# Patient Record
Sex: Male | Born: 1945 | Race: White | Hispanic: No | Marital: Married | Smoking: Former smoker
Health system: Southern US, Community
[De-identification: ages and names within clinical notes are randomized; demographics above are authoritative.]

## PROBLEM LIST (undated history)

## (undated) DIAGNOSIS — K589 Irritable bowel syndrome without diarrhea: Secondary | ICD-10-CM

## (undated) DIAGNOSIS — K5792 Diverticulitis of intestine, part unspecified, without perforation or abscess without bleeding: Secondary | ICD-10-CM

## (undated) DIAGNOSIS — M1990 Unspecified osteoarthritis, unspecified site: Secondary | ICD-10-CM

## (undated) DIAGNOSIS — N189 Chronic kidney disease, unspecified: Secondary | ICD-10-CM

## (undated) DIAGNOSIS — IMO0001 Reserved for inherently not codable concepts without codable children: Secondary | ICD-10-CM

## (undated) DIAGNOSIS — K50014 Crohn's disease of small intestine with abscess: Secondary | ICD-10-CM

## (undated) DIAGNOSIS — D649 Anemia, unspecified: Secondary | ICD-10-CM

## (undated) DIAGNOSIS — K279 Peptic ulcer, site unspecified, unspecified as acute or chronic, without hemorrhage or perforation: Secondary | ICD-10-CM

## (undated) DIAGNOSIS — Z974 Presence of external hearing-aid: Secondary | ICD-10-CM

## (undated) DIAGNOSIS — E119 Type 2 diabetes mellitus without complications: Secondary | ICD-10-CM

## (undated) DIAGNOSIS — T7840XA Allergy, unspecified, initial encounter: Secondary | ICD-10-CM

## (undated) DIAGNOSIS — K219 Gastro-esophageal reflux disease without esophagitis: Secondary | ICD-10-CM

## (undated) DIAGNOSIS — C4491 Basal cell carcinoma of skin, unspecified: Secondary | ICD-10-CM

## (undated) DIAGNOSIS — H269 Unspecified cataract: Secondary | ICD-10-CM

## (undated) DIAGNOSIS — H9192 Unspecified hearing loss, left ear: Secondary | ICD-10-CM

## (undated) DIAGNOSIS — A389 Scarlet fever, uncomplicated: Secondary | ICD-10-CM

## (undated) DIAGNOSIS — Z972 Presence of dental prosthetic device (complete) (partial): Secondary | ICD-10-CM

## (undated) HISTORY — DX: Peptic ulcer, site unspecified, unspecified as acute or chronic, without hemorrhage or perforation: K27.9

## (undated) HISTORY — PX: OTHER SURGICAL HISTORY: SHX169

## (undated) HISTORY — DX: Irritable bowel syndrome, unspecified: K58.9

## (undated) HISTORY — DX: Crohn's disease of small intestine with abscess: K50.014

## (undated) HISTORY — PX: CATARACT EXTRACTION W/ INTRAOCULAR LENS  IMPLANT, BILATERAL: SHX1307

## (undated) HISTORY — PX: HERNIA REPAIR: SHX51

## (undated) HISTORY — DX: Anemia, unspecified: D64.9

## (undated) HISTORY — PX: EYE SURGERY: SHX253

## (undated) HISTORY — DX: Basal cell carcinoma of skin, unspecified: C44.91

## (undated) HISTORY — DX: Scarlet fever, uncomplicated: A38.9

## (undated) HISTORY — DX: Allergy, unspecified, initial encounter: T78.40XA

---

## 1990-02-24 DIAGNOSIS — IMO0001 Reserved for inherently not codable concepts without codable children: Secondary | ICD-10-CM

## 1990-02-24 HISTORY — DX: Reserved for inherently not codable concepts without codable children: IMO0001

## 2009-04-30 ENCOUNTER — Emergency Department: Payer: Self-pay | Admitting: Emergency Medicine

## 2013-01-04 ENCOUNTER — Ambulatory Visit: Payer: Self-pay | Admitting: Family Medicine

## 2014-01-18 DIAGNOSIS — J302 Other seasonal allergic rhinitis: Secondary | ICD-10-CM | POA: Insufficient documentation

## 2014-01-18 DIAGNOSIS — R7303 Prediabetes: Secondary | ICD-10-CM | POA: Insufficient documentation

## 2014-01-18 DIAGNOSIS — M129 Arthropathy, unspecified: Secondary | ICD-10-CM | POA: Insufficient documentation

## 2015-01-23 DIAGNOSIS — E119 Type 2 diabetes mellitus without complications: Secondary | ICD-10-CM | POA: Insufficient documentation

## 2015-03-01 ENCOUNTER — Ambulatory Visit (INDEPENDENT_AMBULATORY_CARE_PROVIDER_SITE_OTHER): Payer: Medicare HMO | Admitting: Surgery

## 2015-03-01 ENCOUNTER — Encounter: Payer: Self-pay | Admitting: Surgery

## 2015-03-01 VITALS — BP 155/90 | HR 88 | Temp 98.2°F | Ht 70.0 in | Wt 170.2 lb

## 2015-03-01 DIAGNOSIS — R1011 Right upper quadrant pain: Secondary | ICD-10-CM | POA: Diagnosis not present

## 2015-03-01 DIAGNOSIS — K8020 Calculus of gallbladder without cholecystitis without obstruction: Secondary | ICD-10-CM | POA: Diagnosis not present

## 2015-03-01 DIAGNOSIS — C4491 Basal cell carcinoma of skin, unspecified: Secondary | ICD-10-CM | POA: Insufficient documentation

## 2015-03-01 DIAGNOSIS — K589 Irritable bowel syndrome without diarrhea: Secondary | ICD-10-CM

## 2015-03-01 DIAGNOSIS — K279 Peptic ulcer, site unspecified, unspecified as acute or chronic, without hemorrhage or perforation: Secondary | ICD-10-CM | POA: Insufficient documentation

## 2015-03-01 DIAGNOSIS — D649 Anemia, unspecified: Secondary | ICD-10-CM

## 2015-03-01 NOTE — Patient Instructions (Addendum)
You are requesting to have your gallbladder removed. We will arrange this to be done on 03/22/15.  Please refer to your (blue) pre-care surgery information sheet.

## 2015-03-01 NOTE — Addendum Note (Signed)
Addended by: Hubbard Robinson on: 03/01/2015 05:29 PM   Modules accepted: Orders

## 2015-03-01 NOTE — Progress Notes (Signed)
Subjective:     Patient ID: Todd Briggs, male   DOB: 12/24/1945, 69 y.o.   MRN: [id number]  HPI  69 yr old male with complaint of RUQ pain.  He had this a few years back and saw Dr. Lundquist but did not want his gallbladder removed then.  Patient states that a few weeks ago after eating a sub with a lot of oil and grease he began having 7 of 10 pain in his RUQ that move around to his back.  Patient states the pain was constant and stayed for about 6 hours before stopping.  Patient states some nausea along with the pain but denies any fever, chills, sob, chest pain, vomiting, diarrhea, constipation or dysuria.  Patient states that his daughter had to have her gallbladder removed too.  Patient does also endorse occasional pains in LLQ along previous hernia site.    Past Medical History  Diagnosis Date  . IBS (irritable bowel syndrome)   . Peptic ulcer 70 years old  . Anemia   . Basal cell carcinoma     Removed 1980's  . Allergy     Seasonal  . Scarlet fever 70 years old   Past Surgical History  Procedure Laterality Date  . Hernia repair Bilateral 70 years old    Inguinal Hernia   Family History  Problem Relation Age of Onset  . Hypertension Mother   . Diabetes Mother   . Pancreatitis Mother   . Heart disease Mother   . Heart attack Father   . COPD Father   . Heart disease Father    Social History   Social History  . Marital Status: Married    Spouse Name: N/A  . Number of Children: N/A  . Years of Education: N/A   Social History Main Topics  . Smoking status: Former Smoker    Quit date: 02/28/1985  . Smokeless tobacco: Never Used  . Alcohol Use: No  . Drug Use: No  . Sexual Activity: Not Asked   Other Topics Concern  . None   Social History Narrative    Current outpatient prescriptions:  .  cetirizine (ZYRTEC) 10 MG tablet, Take 10 mg by mouth daily., Disp: , Rfl:  .  ibuprofen (ADVIL,MOTRIN) 200 MG tablet, Take 200 mg by mouth every 8 (eight) hours as  needed., Disp: , Rfl:  Allergies  Allergen Reactions  . Penicillins     Occurred as a child    Filed Vitals:   03/01/15 1505  BP: 155/90  Pulse: 88  Temp: 98.2 F (36.8 C)      Review of Systems  Constitutional: Negative for fever, chills, activity change, appetite change, fatigue and unexpected weight change.  HENT: Negative for congestion and sore throat.   Respiratory: Negative for chest tightness, shortness of breath and wheezing.   Cardiovascular: Negative for chest pain, palpitations and leg swelling.  Gastrointestinal: Positive for nausea and abdominal pain. Negative for vomiting, diarrhea, constipation, blood in stool and rectal pain.  Genitourinary: Negative for dysuria and hematuria.  Musculoskeletal: Negative for myalgias, back pain and gait problem.  Skin: Negative for color change, pallor, rash and wound.  Neurological: Negative for dizziness and weakness.  Hematological: Negative for adenopathy. Does not bruise/bleed easily.  Psychiatric/Behavioral: Negative for agitation. The patient is not nervous/anxious.   All other systems reviewed and are negative.      Objective:   Physical Exam  Constitutional: He is oriented to person, place, and time. He appears well-developed and   well-nourished. No distress.  HENT:  Head: Normocephalic and atraumatic.  Right Ear: External ear normal.  Left Ear: External ear normal.  Nose: Nose normal.  Mouth/Throat: Oropharynx is clear and moist. No oropharyngeal exudate.  Eyes: Conjunctivae and EOM are normal. Pupils are equal, round, and reactive to light. No scleral icterus.  Neck: Normal range of motion. Neck supple. No tracheal deviation present.  Cardiovascular: Normal rate, regular rhythm, normal heart sounds and intact distal pulses.  Exam reveals no gallop and no friction rub.   No murmur heard. Pulmonary/Chest: Effort normal and breath sounds normal. No respiratory distress. He has no wheezes. He has no rales.   Abdominal: Soft. Bowel sounds are normal. He exhibits no distension. There is no tenderness. There is no rebound and no guarding.  Musculoskeletal: Normal range of motion. He exhibits no edema or tenderness.  Neurological: He is alert and oriented to person, place, and time.  Skin: Skin is warm and dry. No rash noted. No erythema. No pallor.  Psychiatric: He has a normal mood and affect. His behavior is normal. Judgment and thought content normal.  Vitals reviewed.  U/S:  Cholelithiasis with 1.4cm stone in neck, CBD 6mm     Assessment:     69 yr old male with symptomatic cholelithasis     Plan:     The risks, benefits, complications, treatment options, and expected outcomes were discussed with the patient. The possibilities of bleeding, recurrent infection, finding a normal gallbladder, perforation of viscus organs, damage to surrounding structures, bile leak, abscess formation, needing a drain placed, the need for additional procedures, reaction to medication, pulmonary aspiration,  failure to diagnose a condition, the possible need to convert to an open procedure, and creating a complication requiring transfusion or operation were discussed with the patient. The patient and/or family concurred with the proposed plan, giving informed consent.  Planned for 03/22/15.         

## 2015-03-02 ENCOUNTER — Telehealth: Payer: Self-pay | Admitting: Surgery

## 2015-03-02 NOTE — Telephone Encounter (Signed)
Pt advised of pre op date/time and sx date. Sx: 03/22/15 with Dr Rossie Muskrat chole Pre op: 03/13/15 @ 9:45am--office.

## 2015-03-07 ENCOUNTER — Telehealth: Payer: Self-pay | Admitting: Surgery

## 2015-03-07 NOTE — Telephone Encounter (Signed)
No authorization is required for CPT: [phone] with [employer] per automated voice response. Reference number---[id number].

## 2015-03-13 ENCOUNTER — Encounter
Admission: RE | Admit: 2015-03-13 | Discharge: 2015-03-13 | Disposition: A | Discharge status: Home / Self Care | Payer: Medicare HMO | Source: Ambulatory Visit | Attending: Surgery | Admitting: Surgery

## 2015-03-13 DIAGNOSIS — Z01812 Encounter for preprocedural laboratory examination: Secondary | ICD-10-CM | POA: Diagnosis present

## 2015-03-13 DIAGNOSIS — Z01810 Encounter for preprocedural cardiovascular examination: Secondary | ICD-10-CM | POA: Diagnosis present

## 2015-03-13 HISTORY — DX: Reserved for inherently not codable concepts without codable children: IMO0001

## 2015-03-13 HISTORY — DX: Unspecified cataract: H26.9

## 2015-03-13 HISTORY — DX: Unspecified osteoarthritis, unspecified site: M19.90

## 2015-03-13 HISTORY — DX: Type 2 diabetes mellitus without complications: E11.9

## 2015-03-13 HISTORY — DX: Gastro-esophageal reflux disease without esophagitis: K21.9

## 2015-03-13 LAB — BASIC METABOLIC PANEL
Anion gap: 4 — ABNORMAL LOW (ref 5–15)
BUN: 21 mg/dL — ABNORMAL HIGH (ref 6–20)
CHLORIDE: 107 mmol/L (ref 101–111)
CO2: 30 mmol/L (ref 22–32)
CREATININE: 1 mg/dL (ref 0.61–1.24)
Calcium: 8.1 mg/dL — ABNORMAL LOW (ref 8.9–10.3)
GFR calc non Af Amer: >60 mL/min (ref >60)
Glucose, Bld: 95 mg/dL (ref 65–99)
Potassium: 4.1 mmol/L (ref 3.5–5.1)
Sodium: 141 mmol/L (ref 135–145)

## 2015-03-13 NOTE — Patient Instructions (Signed)
  Your procedure is scheduled on: March 22, 2015 (Thursday) Report to Day Surgery.[facility]) Second Floor To find out your arrival time please call [phone] between 1PM - 3PM on March 21, 2015 )Wednesday).  Remember: Instructions that are not followed completely may result in serious medical risk, up to and including death, or upon the discretion of your surgeon and anesthesiologist your surgery may need to be rescheduled.    __x__ 1. Do not eat food or drink liquids after midnight. No gum chewing or hard candies.     __x__ 2. No Alcohol for 24 hours before or after surgery.   ____ 3. Bring all medications with you on the day of surgery if instructed.    __x__ 4. Notify your doctor if there is any change in your medical condition     (cold, fever, infections).     Do not wear jewelry, make-up, hairpins, clips or nail polish.  Do not wear lotions, powders, or perfumes. You may wear deodorant.  Do not shave 48 hours prior to surgery. Men may shave face and neck.  Do not bring valuables to the hospital.    [facility] is not responsible for any belongings or valuables.               Contacts, dentures or bridgework may not be worn into surgery.  Leave your suitcase in the car. After surgery it may be brought to your room.  For patients admitted to the hospital, discharge time is determined by your                treatment team.   Patients discharged the day of surgery will not be allowed to drive home.   Please read over the following fact sheets that you were given:   Surgical Site Infection Prevention   ____ Take these medicines the morning of surgery with A SIP OF WATER:    1.   2.   3.   4.  5.  6.  ____ Fleet Enema (as directed)   __x__ Use CHG Soap as directed  ____ Use inhalers on the day of surgery  ____ Stop metformin 2 days prior to surgery    ____ Take 1/2 of usual insulin dose the night before surgery and none on the morning of surgery.   ____  Stop Coumadin/Plavix/aspirin on   _x___ Stop Anti-inflammatories on (Stop Advil NOW)  Tylenol ok to take for pain if needed   ____ Stop supplements until after surgery.    ____ Bring C-Pap to the hospital.

## 2015-03-13 NOTE — Pre-Procedure Instructions (Signed)
EKG sent to Dr. Azalee Course and anesthesia for review.

## 2015-03-19 ENCOUNTER — Telehealth: Payer: Self-pay | Admitting: Surgery

## 2015-03-19 NOTE — Telephone Encounter (Signed)
This surgery has been canceled for 03/22/15 with Dr Azalee Course. Please let me know when patient would like to reschedule.

## 2015-03-19 NOTE — Telephone Encounter (Signed)
Returned phone call to patient at this time. No answer. Unable to leave a voicemail but did leave my callback number for patient to return phone call.

## 2015-03-19 NOTE — Telephone Encounter (Signed)
Patient has an upcoming surgery and feels like he is getting a cold. He would like to talk to a nurse about this.

## 2015-03-19 NOTE — Telephone Encounter (Signed)
Patient called back in at this time. Patient is coughing up copious amounts of yellow phlegm, has been running a low grade fever around 100, and is very fatigued for the last 3 days. Advised him that it is not advised that he have surgery while having theses symptoms. Patient is very understanding about this.   Please cancel surgery for 1/26 with Dr. Azalee Course. I will contact the surgeon and decide on a reschedule date. We will update patient on this date once this has been confirmed by OR staff.

## 2015-03-21 ENCOUNTER — Telehealth: Payer: Self-pay | Admitting: Surgery

## 2015-03-21 NOTE — Telephone Encounter (Signed)
Pt advised of pre op date/time and sx date. Sx: 03/30/15 with Dr Lenore Manner Cholecystectomy Pre op: 03/13/15 @ 9:45am--office.  This surgery was rescheduled from 03/22/15.

## 2015-03-21 NOTE — Telephone Encounter (Signed)
Patient has been rescheduled for 03/30/15 with Dr Azalee Course.

## 2015-03-22 ENCOUNTER — Encounter: Admission: RE | Payer: Self-pay | Source: Ambulatory Visit

## 2015-03-22 ENCOUNTER — Ambulatory Visit: Admission: RE | Admit: 2015-03-22 | Payer: Medicare HMO | Source: Ambulatory Visit | Admitting: Surgery

## 2015-03-22 SURGERY — LAPAROSCOPIC CHOLECYSTECTOMY WITH INTRAOPERATIVE CHOLANGIOGRAM
Anesthesia: General

## 2015-03-29 MED ORDER — SODIUM CHLORIDE 0.9 % IV SOLN: INTRAVENOUS | Status: DC

## 2015-03-30 ENCOUNTER — Encounter
Admission: RE | Disposition: A | Discharge status: Home / Self Care | Payer: Self-pay | Source: Ambulatory Visit | Attending: Surgery

## 2015-03-30 ENCOUNTER — Ambulatory Visit
Admission: RE | Admit: 2015-03-30 | Discharge: 2015-03-30 | Disposition: A | Discharge status: Home / Self Care | Payer: Medicare HMO | Source: Ambulatory Visit | Attending: Surgery | Admitting: Surgery

## 2015-03-30 ENCOUNTER — Encounter: Payer: Self-pay | Admitting: *Deleted

## 2015-03-30 ENCOUNTER — Ambulatory Visit: Payer: Medicare HMO | Admitting: Anesthesiology

## 2015-03-30 DIAGNOSIS — K8010 Calculus of gallbladder with chronic cholecystitis without obstruction: Secondary | ICD-10-CM | POA: Diagnosis not present

## 2015-03-30 DIAGNOSIS — Z87898 Personal history of other specified conditions: Secondary | ICD-10-CM | POA: Insufficient documentation

## 2015-03-30 DIAGNOSIS — Z8379 Family history of other diseases of the digestive system: Secondary | ICD-10-CM | POA: Diagnosis not present

## 2015-03-30 DIAGNOSIS — Z8711 Personal history of peptic ulcer disease: Secondary | ICD-10-CM | POA: Diagnosis not present

## 2015-03-30 DIAGNOSIS — Z825 Family history of asthma and other chronic lower respiratory diseases: Secondary | ICD-10-CM | POA: Diagnosis not present

## 2015-03-30 DIAGNOSIS — K811 Chronic cholecystitis: Secondary | ICD-10-CM | POA: Diagnosis present

## 2015-03-30 DIAGNOSIS — Z8249 Family history of ischemic heart disease and other diseases of the circulatory system: Secondary | ICD-10-CM | POA: Insufficient documentation

## 2015-03-30 DIAGNOSIS — K589 Irritable bowel syndrome without diarrhea: Secondary | ICD-10-CM | POA: Diagnosis not present

## 2015-03-30 DIAGNOSIS — Z79899 Other long term (current) drug therapy: Secondary | ICD-10-CM | POA: Insufficient documentation

## 2015-03-30 DIAGNOSIS — Z833 Family history of diabetes mellitus: Secondary | ICD-10-CM | POA: Diagnosis not present

## 2015-03-30 DIAGNOSIS — Z85828 Personal history of other malignant neoplasm of skin: Secondary | ICD-10-CM | POA: Diagnosis not present

## 2015-03-30 DIAGNOSIS — K8000 Calculus of gallbladder with acute cholecystitis without obstruction: Secondary | ICD-10-CM | POA: Insufficient documentation

## 2015-03-30 DIAGNOSIS — Z880 Allergy status to penicillin: Secondary | ICD-10-CM | POA: Insufficient documentation

## 2015-03-30 DIAGNOSIS — D649 Anemia, unspecified: Secondary | ICD-10-CM | POA: Diagnosis not present

## 2015-03-30 DIAGNOSIS — Z87891 Personal history of nicotine dependence: Secondary | ICD-10-CM | POA: Insufficient documentation

## 2015-03-30 HISTORY — PX: CHOLECYSTECTOMY: SHX55

## 2015-03-30 LAB — GLUCOSE, CAPILLARY
Glucose-Capillary: 86 mg/dL (ref 65–99)
Glucose-Capillary: 98 mg/dL (ref 65–99)

## 2015-03-30 SURGERY — LAPAROSCOPIC CHOLECYSTECTOMY
Anesthesia: General | Wound class: Clean Contaminated

## 2015-03-30 MED ORDER — SUGAMMADEX SODIUM 200 MG/2ML IV SOLN
INTRAVENOUS | Status: DC | PRN
  Administered 2015-03-30: 154.2 mg via INTRAVENOUS

## 2015-03-30 MED ORDER — FAMOTIDINE 20 MG PO TABS
20.0000 mg | ORAL_TABLET | Freq: Once | ORAL | Status: AC
  Administered 2015-03-30: 20 mg via ORAL

## 2015-03-30 MED ORDER — CHLORHEXIDINE GLUCONATE 4 % EX LIQD: 1.0000 "application " | Freq: Once | CUTANEOUS | Status: DC

## 2015-03-30 MED ORDER — ONDANSETRON HCL 4 MG/2ML IJ SOLN
INTRAMUSCULAR | Status: DC | PRN
  Administered 2015-03-30: 4 mg via INTRAVENOUS

## 2015-03-30 MED ORDER — HYDROMORPHONE HCL 1 MG/ML IJ SOLN
0.2500 mg | INTRAMUSCULAR | Status: DC | PRN
  Administered 2015-03-30: 0.25 mg via INTRAVENOUS

## 2015-03-30 MED ORDER — PHENYLEPHRINE HCL 10 MG/ML IJ SOLN
INTRAMUSCULAR | Status: DC | PRN
  Administered 2015-03-30: 100 ug via INTRAVENOUS

## 2015-03-30 MED ORDER — HYDROMORPHONE HCL 1 MG/ML IJ SOLN
INTRAMUSCULAR | Status: AC
  Filled 2015-03-30: qty 1

## 2015-03-30 MED ORDER — FAMOTIDINE 20 MG PO TABS
ORAL_TABLET | ORAL | Status: AC
  Administered 2015-03-30: 20 mg via ORAL
  Filled 2015-03-30: qty 1

## 2015-03-30 MED ORDER — CIPROFLOXACIN IN D5W 400 MG/200ML IV SOLN
INTRAVENOUS | Status: AC
  Administered 2015-03-30: 400 mg via INTRAVENOUS
  Filled 2015-03-30: qty 200

## 2015-03-30 MED ORDER — ROCURONIUM BROMIDE 100 MG/10ML IV SOLN
INTRAVENOUS | Status: DC | PRN
  Administered 2015-03-30: 40 mg via INTRAVENOUS
  Administered 2015-03-30: 10 mg via INTRAVENOUS

## 2015-03-30 MED ORDER — KETOROLAC TROMETHAMINE 30 MG/ML IJ SOLN
INTRAMUSCULAR | Status: DC | PRN
  Administered 2015-03-30: 30 mg via INTRAVENOUS

## 2015-03-30 MED ORDER — BUPIVACAINE HCL (PF) 0.5 % IJ SOLN
INTRAMUSCULAR | Status: AC
  Filled 2015-03-30: qty 30

## 2015-03-30 MED ORDER — CIPROFLOXACIN IN D5W 400 MG/200ML IV SOLN
400.0000 mg | INTRAVENOUS | Status: AC
  Administered 2015-03-30: 400 mg via INTRAVENOUS

## 2015-03-30 MED ORDER — SODIUM CHLORIDE 0.9 % IV SOLN
INTRAVENOUS | Status: DC
  Administered 2015-03-30 (×3): via INTRAVENOUS

## 2015-03-30 MED ORDER — BUPIVACAINE HCL (PF) 0.5 % IJ SOLN
INTRAMUSCULAR | Status: DC | PRN
  Administered 2015-03-30: 30 mL

## 2015-03-30 MED ORDER — LIDOCAINE HCL (CARDIAC) 20 MG/ML IV SOLN
INTRAVENOUS | Status: DC | PRN
  Administered 2015-03-30: 100 mg via INTRAVENOUS

## 2015-03-30 MED ORDER — SUCCINYLCHOLINE CHLORIDE 20 MG/ML IJ SOLN
INTRAMUSCULAR | Status: DC | PRN
  Administered 2015-03-30: 120 mg via INTRAVENOUS

## 2015-03-30 MED ORDER — METRONIDAZOLE IN NACL 5-0.79 MG/ML-% IV SOLN
500.0000 mg | INTRAVENOUS | Status: AC
  Administered 2015-03-30: 500 mg via INTRAVENOUS
  Filled 2015-03-30: qty 100

## 2015-03-30 MED ORDER — PROPOFOL 10 MG/ML IV BOLUS
INTRAVENOUS | Status: DC | PRN
  Administered 2015-03-30: 150 mg via INTRAVENOUS

## 2015-03-30 MED ORDER — ONDANSETRON HCL 4 MG/2ML IJ SOLN: 4.0000 mg | Freq: Once | INTRAMUSCULAR | Status: DC | PRN

## 2015-03-30 MED ORDER — FENTANYL CITRATE (PF) 100 MCG/2ML IJ SOLN
INTRAMUSCULAR | Status: DC | PRN
  Administered 2015-03-30: 50 ug via INTRAVENOUS
  Administered 2015-03-30: 100 ug via INTRAVENOUS

## 2015-03-30 MED ORDER — MIDAZOLAM HCL 2 MG/2ML IJ SOLN
INTRAMUSCULAR | Status: DC | PRN
  Administered 2015-03-30: 2 mg via INTRAVENOUS

## 2015-03-30 MED ORDER — LACTATED RINGERS IV SOLN: INTRAVENOUS | Status: DC

## 2015-03-30 MED ORDER — OXYCODONE-ACETAMINOPHEN 5-325 MG PO TABS
1.0000 | ORAL_TABLET | ORAL | Status: DC | PRN
Start: 2015-03-30 — End: 2015-04-18

## 2015-03-30 MED ORDER — DEXAMETHASONE SODIUM PHOSPHATE 10 MG/ML IJ SOLN
INTRAMUSCULAR | Status: DC | PRN
  Administered 2015-03-30: 10 mg via INTRAVENOUS

## 2015-03-30 SURGICAL SUPPLY — 38 items
APPLIER CLIP 5 13 M/L LIGAMAX5 (MISCELLANEOUS) ×3
BLADE SURG 15 STRL LF DISP TIS (BLADE) ×1 IMPLANT
BLADE SURG 15 STRL SS (BLADE) ×2
CANISTER SUCT 1200ML W/VALVE (MISCELLANEOUS) ×3 IMPLANT
CATH CHOLANGI 4FR 420404F (CATHETERS) IMPLANT
CHLORAPREP W/TINT 26ML (MISCELLANEOUS) ×3 IMPLANT
CLIP APPLIE 5 13 M/L LIGAMAX5 (MISCELLANEOUS) ×1 IMPLANT
CONRAY 60ML FOR OR (MISCELLANEOUS) IMPLANT
DEFOGGER SCOPE WARMER CLEARIFY (MISCELLANEOUS) ×3 IMPLANT
ELECT E-Z MONOPOLAR 33 (MISCELLANEOUS) ×3
ELECT REM PT RETURN 9FT ADLT (ELECTROSURGICAL) ×3
ELECTRODE E-Z MONOPOLAR 33 (MISCELLANEOUS) ×1 IMPLANT
ELECTRODE REM PT RTRN 9FT ADLT (ELECTROSURGICAL) ×1 IMPLANT
ENDOPOUCH RETRIEVER 10 (MISCELLANEOUS) ×3 IMPLANT
GLOVE PI ORTHOPRO 6.5 (GLOVE) ×2
GLOVE PI ORTHOPRO STRL 6.5 (GLOVE) ×1 IMPLANT
GOWN STRL REUS W/ TWL LRG LVL3 (GOWN DISPOSABLE) ×4 IMPLANT
GOWN STRL REUS W/TWL LRG LVL3 (GOWN DISPOSABLE) ×8
IRRIGATION STRYKERFLOW (MISCELLANEOUS) ×1 IMPLANT
IRRIGATOR STRYKERFLOW (MISCELLANEOUS) ×3
IV CATH ANGIO 12GX3 LT BLUE (NEEDLE) IMPLANT
IV NS 1000ML (IV SOLUTION) ×2
IV NS 1000ML BAXH (IV SOLUTION) ×1 IMPLANT
LABEL OR SOLS (LABEL) ×3 IMPLANT
LIQUID BAND (GAUZE/BANDAGES/DRESSINGS) ×3 IMPLANT
NEEDLE HYPO 25X1 1.5 SAFETY (NEEDLE) ×3 IMPLANT
NS IRRIG 500ML POUR BTL (IV SOLUTION) ×3 IMPLANT
PACK LAP CHOLECYSTECTOMY (MISCELLANEOUS) ×3 IMPLANT
PENCIL ELECTRO HAND CTR (MISCELLANEOUS) ×3 IMPLANT
SCISSORS METZENBAUM CVD 33 (INSTRUMENTS) ×3 IMPLANT
SLEEVE ENDOPATH XCEL 5M (ENDOMECHANICALS) ×6 IMPLANT
SUT MNCRL 4-0 (SUTURE) ×4
SUT MNCRL 4-0 27XMFL (SUTURE) ×2
SUT VICRYL 0 AB UR-6 (SUTURE) ×3 IMPLANT
SUTURE MNCRL 4-0 27XMF (SUTURE) ×2 IMPLANT
TROCAR XCEL BLUNT TIP 100MML (ENDOMECHANICALS) ×3 IMPLANT
TROCAR XCEL NON-BLD 5MMX100MML (ENDOMECHANICALS) ×3 IMPLANT
TUBING INSUFFLATOR HI FLOW (MISCELLANEOUS) ×3 IMPLANT

## 2015-03-30 NOTE — Anesthesia Postprocedure Evaluation (Signed)
Anesthesia Post Note  Patient: Todd Briggs  Procedure(s) Performed: Procedure(s) (LRB): LAPAROSCOPIC CHOLECYSTECTOMY (N/A)  Patient location during evaluation: PACU Anesthesia Type: General Level of consciousness: awake and alert Pain management: pain level controlled Vital Signs Assessment: post-procedure vital signs reviewed and stable Respiratory status: spontaneous breathing Cardiovascular status: blood pressure returned to baseline Postop Assessment: no headache Anesthetic complications: no    Last Vitals:  Filed Vitals:   03/30/15 0923 03/30/15 0932  BP:    Pulse: 64 71  Temp:    Resp: 15 16    Last Pain: There were no vitals filed for this visit.               [name]

## 2015-03-30 NOTE — Interval H&P Note (Signed)
History and Physical Interval Note:  03/30/2015 7:28 AM  Todd Briggs  has presented today for surgery, with the diagnosis of symptomatic cholelithiasis  The various methods of treatment have been discussed with the patient and family. After consideration of risks, benefits and other options for treatment, the patient has consented to  Procedure(s): LAPAROSCOPIC CHOLECYSTECTOMY (N/A) as a surgical intervention .  The patient's history has been reviewed, patient examined, no change in status, stable for surgery.  I have reviewed the patient's chart and labs.  Questions were answered to the patient's satisfaction.     Catherine L Loflin

## 2015-03-30 NOTE — Anesthesia Preprocedure Evaluation (Signed)
Anesthesia Evaluation  Patient identified by MRN, date of birth, ID band Patient awake    Reviewed: Allergy & Precautions, NPO status , Patient's Chart, lab work & pertinent test results  Airway Mallampati: II  TM Distance: >3 FB Neck ROM: Limited    Dental  (+) Upper Dentures   Pulmonary shortness of breath and with exertion, former smoker,    Pulmonary exam normal        Cardiovascular Exercise Tolerance: Good Normal cardiovascular exam     Neuro/Psych    GI/Hepatic GERD  Medicated and Controlled,  Endo/Other  diabetes, Well Controlled, Type 2BG 86.  Renal/GU      Musculoskeletal  (+) Arthritis , Osteoarthritis,    Abdominal (+)  Abdomen: soft.    Peds  Hematology   Anesthesia Other Findings   Reproductive/Obstetrics                             Anesthesia Physical Anesthesia Plan  ASA: III  Anesthesia Plan: General   Post-op Pain Management:    Induction: Intravenous  Airway Management Planned: Oral ETT  Additional Equipment:   Intra-op Plan:   Post-operative Plan: Extubation in OR  Informed Consent: I have reviewed the patients History and Physical, chart, labs and discussed the procedure including the risks, benefits and alternatives for the proposed anesthesia with the patient or authorized representative who has indicated his/her understanding and acceptance.     Plan Discussed with: CRNA  Anesthesia Plan Comments:         Anesthesia Quick Evaluation

## 2015-03-30 NOTE — H&P (View-Only) (Signed)
Subjective:     Patient ID: PELLEGRINO Briggs, male   DOB: 1946/01/28, 70 y.o.   MRN: [id number]  HPI  70 yr old male with complaint of RUQ pain.  He had this a few years back and saw Dr. Rexene Briggs but did not want his gallbladder removed then.  Patient states that a few weeks ago after eating a sub with a lot of oil and grease he began having 7 of 10 pain in his RUQ that move around to his back.  Patient states the pain was constant and stayed for about 6 hours before stopping.  Patient states some nausea along with the pain but denies any fever, chills, sob, chest pain, vomiting, diarrhea, constipation or dysuria.  Patient states that his daughter had to have her gallbladder removed too.  Patient does also endorse occasional pains in LLQ along previous hernia site.    Past Medical History  Diagnosis Date  . IBS (irritable bowel syndrome)   . Peptic ulcer 70 years old  . Anemia   . Basal cell carcinoma     Removed 1980's  . Allergy     Seasonal  . Scarlet fever 70 years old   Past Surgical History  Procedure Laterality Date  . Hernia repair Bilateral 70 years old    Inguinal Hernia   Family History  Problem Relation Age of Onset  . Hypertension Mother   . Diabetes Mother   . Pancreatitis Mother   . Heart disease Mother   . Heart attack Father   . COPD Father   . Heart disease Father    Social History   Social History  . Marital Status: Married    Spouse Name: N/A  . Number of Children: N/A  . Years of Education: N/A   Social History Main Topics  . Smoking status: Former Smoker    Quit date: 02/28/1985  . Smokeless tobacco: Never Used  . Alcohol Use: No  . Drug Use: No  . Sexual Activity: Not Asked   Other Topics Concern  . None   Social History Narrative    Current outpatient prescriptions:  .  cetirizine (ZYRTEC) 10 MG tablet, Take 10 mg by mouth daily., Disp: , Rfl:  .  ibuprofen (ADVIL,MOTRIN) 200 MG tablet, Take 200 mg by mouth every 8 (eight) hours as  needed., Disp: , Rfl:  Allergies  Allergen Reactions  . Penicillins     Occurred as a child    Filed Vitals:   03/01/15 1505  BP: 155/90  Pulse: 88  Temp: 98.2 F (36.8 C)      Review of Systems  Constitutional: Negative for fever, chills, activity change, appetite change, fatigue and unexpected weight change.  HENT: Negative for congestion and sore throat.   Respiratory: Negative for chest tightness, shortness of breath and wheezing.   Cardiovascular: Negative for chest pain, palpitations and leg swelling.  Gastrointestinal: Positive for nausea and abdominal pain. Negative for vomiting, diarrhea, constipation, blood in stool and rectal pain.  Genitourinary: Negative for dysuria and hematuria.  Musculoskeletal: Negative for myalgias, back pain and gait problem.  Skin: Negative for color change, pallor, rash and wound.  Neurological: Negative for dizziness and weakness.  Hematological: Negative for adenopathy. Does not bruise/bleed easily.  Psychiatric/Behavioral: Negative for agitation. The patient is not nervous/anxious.   All other systems reviewed and are negative.      Objective:   Physical Exam  Constitutional: He is oriented to person, place, and time. He appears well-developed and  well-nourished. No distress.  HENT:  Head: Normocephalic and atraumatic.  Right Ear: External ear normal.  Left Ear: External ear normal.  Nose: Nose normal.  Mouth/Throat: Oropharynx is clear and moist. No oropharyngeal exudate.  Eyes: Conjunctivae and EOM are normal. Pupils are equal, round, and reactive to light. No scleral icterus.  Neck: Normal range of motion. Neck supple. No tracheal deviation present.  Cardiovascular: Normal rate, regular rhythm, normal heart sounds and intact distal pulses.  Exam reveals no gallop and no friction rub.   No murmur heard. Pulmonary/Chest: Effort normal and breath sounds normal. No respiratory distress. He has no wheezes. He has no rales.   Abdominal: Soft. Bowel sounds are normal. He exhibits no distension. There is no tenderness. There is no rebound and no guarding.  Musculoskeletal: Normal range of motion. He exhibits no edema or tenderness.  Neurological: He is alert and oriented to person, place, and time.  Skin: Skin is warm and dry. No rash noted. No erythema. No pallor.  Psychiatric: He has a normal mood and affect. His behavior is normal. Judgment and thought content normal.  Vitals reviewed.  U/S:  Cholelithiasis with 1.4cm stone in neck, CBD 45m     Assessment:     70yr old male with symptomatic cholelithasis     Plan:     The risks, benefits, complications, treatment options, and expected outcomes were discussed with the patient. The possibilities of bleeding, recurrent infection, finding a normal gallbladder, perforation of viscus organs, damage to surrounding structures, bile leak, abscess formation, needing a drain placed, the need for additional procedures, reaction to medication, pulmonary aspiration,  failure to diagnose a condition, the possible need to convert to an open procedure, and creating a complication requiring transfusion or operation were discussed with the patient. The patient and/or family concurred with the proposed plan, giving informed consent.  Planned for 03/22/15.

## 2015-03-30 NOTE — Anesthesia Procedure Notes (Signed)
Procedure Name: Intubation Date/Time: 03/30/2015 7:51 AM Performed by: Nelda Marseille Pre-anesthesia Checklist: Patient identified, Patient being monitored, Timeout performed, Emergency Drugs available and Suction available Patient Re-evaluated:Patient Re-evaluated prior to inductionOxygen Delivery Method: Circle system utilized Preoxygenation: Pre-oxygenation with 100% oxygen Intubation Type: IV induction Ventilation: Mask ventilation without difficulty Laryngoscope Size: Mac and 3 Grade View: Grade I Tube type: Oral Tube size: 7.5 mm Number of attempts: 1 Airway Equipment and Method: Stylet Placement Confirmation: ETT inserted through vocal cords under direct vision,  positive ETCO2 and breath sounds checked- equal and bilateral Secured at: 22 cm Tube secured with: Tape Dental Injury: Teeth and Oropharynx as per pre-operative assessment

## 2015-03-30 NOTE — Op Note (Signed)
Laparoscopic Cholecystectomy Procedure Note  Indications: This patient presents with symptomatic gallbladder disease and will undergo laparoscopic cholecystectomy.  Pre-operative Diagnosis: Calculus of gallbladder with acute cholecystitis, without mention of obstruction  Post-operative Diagnosis: Calculus of gallbladder with acute cholecystitis, without mention of obstruction  Surgeon: Hubbard Robinson   Assistants: none  Anesthesia: General endotracheal anesthesia  ASA Class: 2  Procedure Details  The patient was seen again in the Holding Room. The risks, benefits, complications, treatment options, and expected outcomes were discussed with the patient. The possibilities of reaction to medication, pulmonary aspiration, perforation of viscus, bleeding, recurrent infection, finding a normal gallbladder, the need for additional procedures, failure to diagnose a condition, the possible need to convert to an open procedure, and creating a complication requiring transfusion or operation were discussed with the patient. The patient and/or family concurred with the proposed plan, giving informed consent. The patient was taken to Operating Room, identified as Todd Briggs and the procedure verified as Laparoscopic Cholecystectomy. A Time Out was held and the above information confirmed.  Prior to the induction of general anesthesia, antibiotic prophylaxis was administered. General endotracheal anesthesia was then administered and tolerated well. After the induction, the abdomen was prepped in the usual sterile fashion. The patient was positioned in the supine position with the left arm comfortably tucked, along with some reverse Trendelenburg.  Local anesthetic agent was injected into the skin near the umbilicus and an incision made. The midline fascia was incised and the Hasson technique was used to introduce a 10 mm port under direct vision. It was secured with two figure of eight Vicryl suture  placed in the usual fashion. Pneumoperitoneum was then created with CO2 and tolerated well without any adverse changes in the patient's vital signs. Additional trocars were introduced under direct vision. All skin incisions were infiltrated with a local anesthetic agent before making the incision and placing the trocars.   The gallbladder was identified, the fundus grasped and retracted cephalad. Adhesions were lysed bluntly and with the electrocautery where indicated, taking care not to injure any adjacent organs or viscus. The infundibulum was grasped and retracted laterally, exposing the peritoneum overlying the triangle of Calot. This was then divided and exposed in a blunt fashion. The cystic duct was clearly identified and bluntly dissected circumferentially. The junctions of the gallbladder, cystic duct and common bile duct were clearly identified prior to the division of any linear structure.  The cystic duct was then doubly ligated with surgical clips and/or Endoloop suture on the patient side and singly clipped on the gallbladder side and divided. The cystic artery was identified, dissected free, ligated with clips and divided as well.   The gallbladder was dissected from the liver bed in retrograde fashion with the electrocautery. The gallbladder was removed. The liver bed was irrigated and inspected. Hemostasis was achieved with the electrocautery. Copious irrigation was utilized and was repeatedly aspirated until clear all particulate matter.  Pneumoperitoneum was completely reduced after viewing removal of the trocars under direct vision. The wound was thoroughly irrigated and the fascia was then closed with the previously placed figure of eight sutures; the skin was then closed with 4-0 Monocryl and a sterile glue was applied.  Instrument, sponge, and needle counts were correct at closure and at the conclusion of the case.   Findings: Cholecystitis with Cholelithiasis  Estimated Blood  Loss: Minimal         Drains: none         Total IV Fluids:  1093mL         Specimens: Gallbladder           Complications: None; patient tolerated the procedure well.         Disposition: PACU - hemodynamically stable.         Condition: stable

## 2015-03-30 NOTE — Transfer of Care (Signed)
Immediate Anesthesia Transfer of Care Note  Patient: Todd Briggs  Procedure(s) Performed: Procedure(s): LAPAROSCOPIC CHOLECYSTECTOMY (N/A)  Patient Location: PACU  Anesthesia Type:General  Level of Consciousness: sedated  Airway & Oxygen Therapy: Patient Spontanous Breathing and Patient connected to face mask oxygen  Post-op Assessment: Report given to RN and Post -op Vital signs reviewed and stable  Post vital signs: Reviewed and stable  Last Vitals:  Filed Vitals:   03/30/15 0617  BP: 128/84  Pulse: 84  Temp: 36.7 C  Resp: 16    Complications: No apparent anesthesia complications

## 2015-03-30 NOTE — Discharge Instructions (Addendum)
AMBULATORY SURGERY  DISCHARGE INSTRUCTIONS   1) The drugs that you were given will stay in your system until tomorrow so for the next 24 hours you should not:  A) Drive an automobile B) Make any legal decisions C) Drink any alcoholic beverage   2) You may resume regular meals tomorrow.  Today it is better to start with liquids and gradually work up to solid foods.  You may eat anything you prefer, but it is better to start with liquids, then soup and crackers, and gradually work up to solid foods.   3) Please notify your doctor immediately if you have any unusual bleeding, trouble breathing, redness and pain at the surgery site, drainage, fever, or pain not relieved by medication.  4) Splint abdominal incision when coughing, sneezing or moving.

## 2015-04-02 LAB — SURGICAL PATHOLOGY

## 2015-04-18 ENCOUNTER — Ambulatory Visit (INDEPENDENT_AMBULATORY_CARE_PROVIDER_SITE_OTHER): Payer: Medicare HMO | Admitting: Surgery

## 2015-04-18 ENCOUNTER — Encounter: Payer: Self-pay | Admitting: Surgery

## 2015-04-18 VITALS — BP 140/78 | HR 93 | Temp 97.5°F | Wt 170.0 lb

## 2015-04-18 DIAGNOSIS — K8020 Calculus of gallbladder without cholecystitis without obstruction: Secondary | ICD-10-CM

## 2015-04-18 DIAGNOSIS — K8010 Calculus of gallbladder with chronic cholecystitis without obstruction: Secondary | ICD-10-CM

## 2015-04-18 NOTE — Patient Instructions (Signed)
Please give [place] a call if you continue to have pain on the left side of your abdomen in a month. Maybe you will need a sterile injection to help with the nerve.   Please give [place] a call if you notice any redness, fever, and/or drainage from your incisions.

## 2015-04-18 NOTE — Progress Notes (Signed)
70 year old male status post lap scopic cholecystectomy on 03/30/2015. Patient is feeling well. Patient states he has some pain just to the left of the umbilical incision. Patient states that his appetite is great he's eating well and that his bowel movements have returned to his normal loose bowel movements. Patient denies any nausea or vomiting. Patient did use a partial on more some yesterday and had some pain after that however otherwise is been doing well  Filed Vitals:   04/18/15 0915  BP: 140/78  Pulse: 93  Temp: 97.5 F (36.4 C)   PE: Gen:NAD [id number], non-distended, minimal tenderness to the left of umbilical incision even with light tough, all incisions c/d/i, no erythema or drainage Ext: 2+ pulses, no edema  A/P:  Patient doing well status post laparoscopic cholecystectomy. Discussed that since he is 3 weeks out he can start lifting things over 15 pounds but take it easy and go slowly. 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 Also discussed that the pain next to his umbilical incision is likely a nerve pain and should get better over the next 4 weeks better for him to call minus no if it does not and we can always give him a steroid shot in the area.

## 2016-02-13 ENCOUNTER — Other Ambulatory Visit: Payer: Self-pay | Admitting: Unknown Physician Specialty

## 2016-02-13 DIAGNOSIS — H903 Sensorineural hearing loss, bilateral: Secondary | ICD-10-CM

## 2016-02-13 DIAGNOSIS — H9313 Tinnitus, bilateral: Secondary | ICD-10-CM

## 2016-02-21 ENCOUNTER — Ambulatory Visit
Admission: RE | Admit: 2016-02-21 | Discharge: 2016-02-21 | Disposition: A | Discharge status: Home / Self Care | Payer: Medicare HMO | Source: Ambulatory Visit | Attending: Unknown Physician Specialty | Admitting: Unknown Physician Specialty

## 2016-02-21 DIAGNOSIS — H905 Unspecified sensorineural hearing loss: Secondary | ICD-10-CM | POA: Insufficient documentation

## 2016-02-21 DIAGNOSIS — I6782 Cerebral ischemia: Secondary | ICD-10-CM | POA: Insufficient documentation

## 2016-02-21 DIAGNOSIS — H903 Sensorineural hearing loss, bilateral: Secondary | ICD-10-CM

## 2016-02-21 DIAGNOSIS — H9319 Tinnitus, unspecified ear: Secondary | ICD-10-CM | POA: Insufficient documentation

## 2016-02-21 DIAGNOSIS — H9313 Tinnitus, bilateral: Secondary | ICD-10-CM

## 2016-02-21 LAB — POCT I-STAT CREATININE: CREATININE: 1.2 mg/dL (ref 0.61–1.24)

## 2016-02-21 MED ORDER — GADOBENATE DIMEGLUMINE 529 MG/ML IV SOLN
20.0000 mL | Freq: Once | INTRAVENOUS | Status: AC | PRN
  Administered 2016-02-21: 16 mL via INTRAVENOUS

## 2016-05-15 ENCOUNTER — Ambulatory Visit
Admission: EM | Admit: 2016-05-15 | Discharge: 2016-05-15 | Disposition: A | Discharge status: Home / Self Care | Payer: Medicare HMO | Attending: Family Medicine | Admitting: Family Medicine

## 2016-05-15 DIAGNOSIS — K529 Noninfective gastroenteritis and colitis, unspecified: Secondary | ICD-10-CM

## 2016-05-15 MED ORDER — RANITIDINE HCL 150 MG PO CAPS: ORAL_CAPSULE | ORAL | 0 refills | Status: DC

## 2016-05-15 MED ORDER — ONDANSETRON 8 MG PO TBDP
8.0000 mg | ORAL_TABLET | Freq: Once | ORAL | Status: AC
  Administered 2016-05-15: 8 mg via ORAL

## 2016-05-15 MED ORDER — ONDANSETRON 8 MG PO TBDP: 8.0000 mg | ORAL_TABLET | Freq: Three times a day (TID) | ORAL | 0 refills | Status: DC | PRN

## 2016-05-15 NOTE — Discharge Instructions (Signed)
Take a dose of Zantac 3 times a day with double-stranded Pepto-Bismol for the next 3-5 days uses Zofran for nausea needed

## 2016-05-15 NOTE — ED Provider Notes (Signed)
[facility]    CSN: [id number] Arrival date & time: 05/15/16  1717     History   Chief Complaint Chief Complaint  Patient presents with  . Emesis  . Diarrhea    HPI Todd Briggs is a 71 y.o. male.   Patient's here because of nausea vomiting and diarrhea. States that he ate a heavy meal last night and this morning the vomiting started. He has several episodes of emesis. States he had episodes of sweating got here as well. He is difficult to hearing and states several times he just threw up 3 pass out but later would clarifies that he almost passed out but never did actually pass out. He reports having cramping and he doesn't do to having diarrhea but he has chronic diarrhea he states she's had upper endoscopy colonoscopy he's never gotten a clinical diagnosis why he has diarrhea he goes to bathroom several times during the day. He reports old today with a nausea vomiting he's also had more stool production a normally does. He's been given a dose of Zofran since he got here he reports feeling much better Y stasis, was back in these drinking ginger ale now. 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 Nothe family is sick. In those [facility] dictating same food last night. Mother with hypertension diabetes and father heart disease and heart attack COPD. He is a former smoker and is allergic penicillin. 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 He's had a colonoscopy upper endoscopy cholecystectomy and hernia repair in the past.   The history is provided by the patient and the spouse. No language interpreter was used.  Emesis  Severity:  Severe Duration:  8 hours Timing:  Constant Quality:  Stomach contents Able to tolerate:  Liquids How soon after eating does  vomiting occur:  2 hours Progression:  Improving Chronicity:  New Context: not post-tussive and not self-induced   Relieved by:  Nothing Worsened by:  Antiemetics Associated symptoms: abdominal pain and diarrhea   Risk factors: no prior abdominal surgery and no suspect food intake   Diarrhea  Quality:  Copious Severity:  Moderate Onset quality:  Unable to specify Progression:  Worsening Relieved by:  Nothing Associated symptoms: abdominal pain and vomiting     Past Medical History:  Diagnosis Date  . Allergy    Seasonal  . Anemia   . Arthritis   . Basal cell carcinoma    Removed 1980's  . Cataracts, both eyes   . Diabetes mellitus without complication (HCC)    Borderline  . GERD (gastroesophageal reflux disease)   . IBS (irritable bowel syndrome)   . Peptic ulcer 71 years old  . Scarlet fever 71 years old  . Shortness of breath dyspnea    with exertion    Patient Active Problem List   Diagnosis Date Noted  . IBS (irritable bowel syndrome) 03/01/2015  . Peptic ulcer 03/01/2015  . Anemia 03/01/2015  . Basal cell carcinoma 03/01/2015  . Type 2 diabetes mellitus ([place]) 01/23/2015  . 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 Allergic rhinitis, seasonal 01/18/2014    Past Surgical History:  Procedure Laterality Date  . CHOLECYSTECTOMY N/A 03/30/2015   Procedure: LAPAROSCOPIC CHOLECYSTECTOMY;  Surgeon: Hubbard Robinson, MD;  Location: [facility] ORS;  Service: General;  Laterality: N/A;  . HERNIA REPAIR Bilateral 71 years old   Inguinal Hernia       Home Medications    Prior to Admission medications   Medication Sig Start Date End Date Taking? Authorizing Provider  ondansetron (ZOFRAN ODT) 8 MG disintegrating tablet Take 1 tablet (8 mg total) by mouth every 8 (eight) hours as needed for nausea or vomiting. 05/15/16   Frederich Cha, MD  ranitidine (ZANTAC) 150 MG capsule Take 3 times a day for the next 3-5 days with a dose of double strength Pepto-Bismol  liquids or tablets 05/15/16   Frederich Cha, MD    Family History Family History  Problem Relation Age of Onset  . Hypertension Mother   . Diabetes Mother   . Pancreatitis Mother   . Heart disease Mother   . Heart attack Father   . COPD Father   . Heart disease Father     Social History Social History  Substance Use Topics  . Smoking status: Former Smoker    Packs/day: 1.00    Types: Cigarettes    Quit date: 02/28/1985  . Smokeless tobacco: Never Used  . Alcohol use No     Allergies   Penicillins   Review of Systems Review of Systems  Gastrointestinal: Positive for abdominal pain, diarrhea and vomiting.  All other systems reviewed and are negative.    Physical Exam Triage Vital Signs ED Triage Vitals  Enc Vitals Group     BP 05/15/16 1759 (!) 145/86     Pulse Rate 05/15/16 1759 95     Resp 05/15/16 1759 18     Temp 05/15/16 1759 97.4 F (36.3 C)     Temp Source 05/15/16 1759 Oral     SpO2 05/15/16 1759 96 %     Weight 05/15/16 1800 168 lb (76.2 kg)     Height 05/15/16 1800 5\' 10"  (1.778 m)     Head Circumference --      Peak Flow --      Pain Score 05/15/16 1801 8     Pain Loc --      Pain Edu? --      Excl. in [place]? --    No data found.   Updated Vital Signs BP 131/79 (BP Location: Left Arm)   Pulse 93   Temp 98 F (36.7 C) (Oral)   Resp 18   Ht 5\' 10"  (1.778 m)   Wt 168 lb (76.2 kg)   SpO2 98%   BMI 24.11 kg/m   Visual Acuity Right Eye Distance:   Left Eye Distance:   Bilateral Distance:    Right Eye Near:   Left Eye Near:    Bilateral Near:     Physical Exam  Constitutional: He is oriented to person, place, and time. He appears well-developed and well-nourished.  HENT:  Head: Normocephalic and atraumatic.  Right Ear: External ear normal.  Left Ear: External ear normal.  Eyes: EOM are normal. Pupils are equal, round, and reactive to light.  Neck: Normal range of motion. Neck supple.  Cardiovascular: Normal rate, regular rhythm and  normal heart sounds.   Pulmonary/Chest: Effort normal and breath sounds normal.  Abdominal: Soft. He exhibits no distension. There is no tenderness.  Musculoskeletal: Normal range of motion. He exhibits no deformity.  Neurological: He is alert and oriented to person, place, and time. No cranial nerve deficit. Coordination normal.  Skin: Skin is warm.  Psychiatric: He has a normal  mood and affect.  Vitals reviewed.    UC Treatments / Results  Labs (all labs ordered are listed, but only abnormal results are displayed) Labs Reviewed - No data to display  EKG  EKG Interpretation None       Radiology No results found.  Procedures Procedures (including critical care time)  Medications Ordered in UC Medications  ondansetron (ZOFRAN-ODT) disintegrating tablet 8 mg (8 mg Oral Given 05/15/16 1804)  Would recommend he continue using Pepto-Bismol double strength 3 times a day but take 50 mg Zantac when he takes it to clear out his GI tract. We'll give him prescription for Zofran for nausea. Will go hold off giving him anything for diuresis he does have normal diarrhea anyway just to allow (GI tract are out. Not better in 3 days return if please see his PCP. Should be noted his blood pressure bowel sounds most better since she's been and the urgent care giving the Zofran and the last drink fluids.  Initial Impression / Assessment and Plan / UC Course  I have reviewed the triage vital signs and the nursing notes.  Pertinent labs & imaging results that were available during my care of the patient were reviewed by me and considered in my medical decision making (see chart for details).       Final Clinical Impressions(s) / UC Diagnoses   Final diagnoses:  Gastroenteritis    New Prescriptions Discharge Medication List as of 05/15/2016  7:03 PM    START taking these medications   Details  ondansetron (ZOFRAN ODT) 8 MG disintegrating tablet Take 1 tablet (8 mg total) by mouth every 8  (eight) hours as needed for nausea or vomiting., Starting Thu 05/15/2016, Normal    ranitidine (ZANTAC) 150 MG capsule Take 3 times a day for the next 3-5 days with a dose of double strength Pepto-Bismol liquids or tablets, Normal         Note: This dictation was prepared with Dragon dictation along with smaller phrase technology. Any transcriptional errors that result from this process are unintentional.   Frederich Cha, MD 05/15/16 [phone]

## 2016-06-06 ENCOUNTER — Other Ambulatory Visit: Payer: Self-pay | Admitting: Family Medicine

## 2016-06-06 DIAGNOSIS — R911 Solitary pulmonary nodule: Secondary | ICD-10-CM

## 2016-06-10 ENCOUNTER — Ambulatory Visit
Admission: RE | Admit: 2016-06-10 | Discharge: 2016-06-10 | Disposition: A | Discharge status: Home / Self Care | Payer: Medicare HMO | Source: Ambulatory Visit | Attending: Family Medicine | Admitting: Family Medicine

## 2016-06-10 DIAGNOSIS — Z9049 Acquired absence of other specified parts of digestive tract: Secondary | ICD-10-CM | POA: Insufficient documentation

## 2016-06-10 DIAGNOSIS — R911 Solitary pulmonary nodule: Secondary | ICD-10-CM | POA: Insufficient documentation

## 2016-06-10 DIAGNOSIS — M5134 Other intervertebral disc degeneration, thoracic region: Secondary | ICD-10-CM | POA: Insufficient documentation

## 2019-02-15 ENCOUNTER — Emergency Department: Payer: Medicare HMO

## 2019-02-15 ENCOUNTER — Observation Stay
Admission: EM | Admit: 2019-02-15 | Discharge: 2019-02-16 | Disposition: A | Discharge status: Home / Self Care | Payer: Medicare HMO | Attending: Internal Medicine | Admitting: Internal Medicine

## 2019-02-15 ENCOUNTER — Encounter: Payer: Self-pay | Admitting: Emergency Medicine

## 2019-02-15 ENCOUNTER — Other Ambulatory Visit: Payer: Self-pay

## 2019-02-15 DIAGNOSIS — N289 Disorder of kidney and ureter, unspecified: Secondary | ICD-10-CM | POA: Diagnosis not present

## 2019-02-15 DIAGNOSIS — N2889 Other specified disorders of kidney and ureter: Secondary | ICD-10-CM

## 2019-02-15 DIAGNOSIS — Z87891 Personal history of nicotine dependence: Secondary | ICD-10-CM | POA: Insufficient documentation

## 2019-02-15 DIAGNOSIS — R1030 Lower abdominal pain, unspecified: Secondary | ICD-10-CM | POA: Diagnosis present

## 2019-02-15 DIAGNOSIS — Z9049 Acquired absence of other specified parts of digestive tract: Secondary | ICD-10-CM | POA: Insufficient documentation

## 2019-02-15 DIAGNOSIS — Z79899 Other long term (current) drug therapy: Secondary | ICD-10-CM | POA: Diagnosis not present

## 2019-02-15 DIAGNOSIS — K589 Irritable bowel syndrome without diarrhea: Secondary | ICD-10-CM | POA: Diagnosis not present

## 2019-02-15 DIAGNOSIS — K50014 Crohn's disease of small intestine with abscess: Secondary | ICD-10-CM

## 2019-02-15 DIAGNOSIS — Z85828 Personal history of other malignant neoplasm of skin: Secondary | ICD-10-CM | POA: Insufficient documentation

## 2019-02-15 DIAGNOSIS — Z8711 Personal history of peptic ulcer disease: Secondary | ICD-10-CM | POA: Insufficient documentation

## 2019-02-15 DIAGNOSIS — K529 Noninfective gastroenteritis and colitis, unspecified: Secondary | ICD-10-CM | POA: Diagnosis not present

## 2019-02-15 DIAGNOSIS — E119 Type 2 diabetes mellitus without complications: Secondary | ICD-10-CM | POA: Diagnosis not present

## 2019-02-15 DIAGNOSIS — R109 Unspecified abdominal pain: Secondary | ICD-10-CM | POA: Diagnosis not present

## 2019-02-15 DIAGNOSIS — Z20828 Contact with and (suspected) exposure to other viral communicable diseases: Secondary | ICD-10-CM | POA: Diagnosis not present

## 2019-02-15 DIAGNOSIS — Z880 Allergy status to penicillin: Secondary | ICD-10-CM | POA: Insufficient documentation

## 2019-02-15 DIAGNOSIS — M1990 Unspecified osteoarthritis, unspecified site: Secondary | ICD-10-CM | POA: Insufficient documentation

## 2019-02-15 DIAGNOSIS — Z833 Family history of diabetes mellitus: Secondary | ICD-10-CM | POA: Insufficient documentation

## 2019-02-15 DIAGNOSIS — K219 Gastro-esophageal reflux disease without esophagitis: Secondary | ICD-10-CM | POA: Diagnosis not present

## 2019-02-15 DIAGNOSIS — H269 Unspecified cataract: Secondary | ICD-10-CM | POA: Diagnosis not present

## 2019-02-15 DIAGNOSIS — E1136 Type 2 diabetes mellitus with diabetic cataract: Secondary | ICD-10-CM | POA: Insufficient documentation

## 2019-02-15 HISTORY — DX: Unspecified abdominal pain: R10.9

## 2019-02-15 LAB — COMPREHENSIVE METABOLIC PANEL
ALT: 17 U/L (ref 0–44)
AST: 32 U/L (ref 15–41)
Albumin: 3.5 g/dL (ref 3.5–5.0)
Alkaline Phosphatase: 42 U/L (ref 38–126)
Anion gap: 10 (ref 5–15)
BUN: 24 mg/dL — ABNORMAL HIGH (ref 8–23)
CO2: 23 mmol/L (ref 22–32)
Calcium: 8 mg/dL — ABNORMAL LOW (ref 8.9–10.3)
Chloride: 105 mmol/L (ref 98–111)
Creatinine, Ser: 0.84 mg/dL (ref 0.61–1.24)
GFR calc Af Amer: >60 mL/min (ref >60)
GFR calc non Af Amer: >60 mL/min (ref >60)
Glucose, Bld: 117 mg/dL — ABNORMAL HIGH (ref 70–99)
Potassium: 4.7 mmol/L (ref 3.5–5.1)
Sodium: 138 mmol/L (ref 135–145)
Total Bilirubin: 2.1 mg/dL — ABNORMAL HIGH (ref 0.3–1.2)
Total Protein: 6.2 g/dL — ABNORMAL LOW (ref 6.5–8.1)

## 2019-02-15 LAB — CBC
HCT: 44.3 % (ref 39.0–52.0)
Hemoglobin: 14.7 g/dL (ref 13.0–17.0)
MCH: 27.9 pg (ref 26.0–34.0)
MCHC: 33.2 g/dL (ref 30.0–36.0)
MCV: 84.1 fL (ref 80.0–100.0)
Platelets: 249 10*3/uL (ref 150–400)
RBC: 5.27 MIL/uL (ref 4.22–5.81)
RDW: 13 % (ref 11.5–15.5)
WBC: 14 10*3/uL — ABNORMAL HIGH (ref 4.0–10.5)
nRBC: 0 % (ref 0.0–0.2)

## 2019-02-15 LAB — LIPASE, BLOOD: Lipase: 25 U/L (ref 11–51)

## 2019-02-15 LAB — SARS CORONAVIRUS 2 (TAT 6-24 HRS): SARS Coronavirus 2: NEGATIVE

## 2019-02-15 MED ORDER — METRONIDAZOLE IN NACL 5-0.79 MG/ML-% IV SOLN
500.0000 mg | Freq: Three times a day (TID) | INTRAVENOUS | Status: DC
  Administered 2019-02-15 – 2019-02-16 (×3): 500 mg via INTRAVENOUS
  Filled 2019-02-15 (×8): qty 100

## 2019-02-15 MED ORDER — ENOXAPARIN SODIUM 40 MG/0.4ML ~~LOC~~ SOLN
40.0000 mg | SUBCUTANEOUS | Status: DC
  Filled 2019-02-15 (×2): qty 0.4

## 2019-02-15 MED ORDER — METRONIDAZOLE 500 MG PO TABS
500.0000 mg | ORAL_TABLET | Freq: Once | ORAL | Status: AC
  Administered 2019-02-15: 500 mg via ORAL
  Filled 2019-02-15: qty 1

## 2019-02-15 MED ORDER — MORPHINE SULFATE (PF) 4 MG/ML IV SOLN
4.0000 mg | Freq: Once | INTRAVENOUS | Status: AC
  Administered 2019-02-15: 4 mg via INTRAVENOUS
  Filled 2019-02-15: qty 1

## 2019-02-15 MED ORDER — ACETAMINOPHEN 325 MG PO TABS
650.0000 mg | ORAL_TABLET | Freq: Four times a day (QID) | ORAL | Status: DC | PRN
  Administered 2019-02-15: 650 mg via ORAL
  Filled 2019-02-15: qty 2

## 2019-02-15 MED ORDER — SODIUM CHLORIDE 0.9 % IV BOLUS
1000.0000 mL | Freq: Once | INTRAVENOUS | Status: AC
  Administered 2019-02-15: 1000 mL via INTRAVENOUS

## 2019-02-15 MED ORDER — CIPROFLOXACIN HCL 500 MG PO TABS
500.0000 mg | ORAL_TABLET | Freq: Once | ORAL | Status: AC
  Administered 2019-02-15: 500 mg via ORAL
  Filled 2019-02-15: qty 1

## 2019-02-15 MED ORDER — IOHEXOL 300 MG/ML  SOLN
100.0000 mL | Freq: Once | INTRAMUSCULAR | Status: AC | PRN
  Administered 2019-02-15: 100 mL via INTRAVENOUS

## 2019-02-15 MED ORDER — CIPROFLOXACIN HCL 500 MG PO TABS
500.0000 mg | ORAL_TABLET | Freq: Two times a day (BID) | ORAL | Status: DC
  Administered 2019-02-15 – 2019-02-16 (×2): 500 mg via ORAL
  Filled 2019-02-15 (×2): qty 1

## 2019-02-15 MED ORDER — SODIUM CHLORIDE 0.9 % IV SOLN
INTRAVENOUS | Status: DC
  Administered 2019-02-15: 1000 mL via INTRAVENOUS

## 2019-02-15 MED ORDER — MORPHINE SULFATE (PF) 2 MG/ML IV SOLN
1.0000 mg | INTRAVENOUS | Status: DC | PRN
  Administered 2019-02-15 – 2019-02-16 (×3): 1 mg via INTRAVENOUS
  Filled 2019-02-15 (×3): qty 1

## 2019-02-15 MED ORDER — MORPHINE SULFATE (PF) 2 MG/ML IV SOLN
2.0000 mg | Freq: Once | INTRAVENOUS | Status: AC
  Administered 2019-02-15: 2 mg via INTRAVENOUS
  Filled 2019-02-15: qty 1

## 2019-02-15 MED ORDER — SODIUM CHLORIDE 0.9 % IV BOLUS
500.0000 mL | Freq: Once | INTRAVENOUS | Status: AC
  Administered 2019-02-15: 500 mL via INTRAVENOUS

## 2019-02-15 MED ORDER — ONDANSETRON HCL 4 MG/2ML IJ SOLN: 4.0000 mg | Freq: Three times a day (TID) | INTRAMUSCULAR | Status: DC | PRN

## 2019-02-15 MED ORDER — FAMOTIDINE 20 MG PO TABS: 20.0000 mg | ORAL_TABLET | Freq: Every day | ORAL | Status: DC | PRN

## 2019-02-15 MED ORDER — ONDANSETRON HCL 4 MG/2ML IJ SOLN
4.0000 mg | Freq: Once | INTRAMUSCULAR | Status: AC | PRN
  Administered 2019-02-15: 4 mg via INTRAVENOUS
  Filled 2019-02-15: qty 2

## 2019-02-15 NOTE — ED Notes (Signed)
Patient transported to CT 

## 2019-02-15 NOTE — ED Provider Notes (Signed)
Reviewed case and care with Dr. Kennieth Francois.  Reviewed clinical history as well as CT imaging findings with me.  Advises that could trial antibiotics and clear liquid diet outpatient, with careful return precautions.  Discussed the potential notation of a mild functional obstruction as well.  I also discussed with the patient, the patient reports that he would strongly prefer to be able to try managing his symptoms at home including antibiotics and diet and close follow-up with GI.  Patient is fully awake and alert.  Does report that he has had loose bowel movement.  He does not appear to have any obvious obstructive symptoms and his abdomen is not distended  We will start with trial of clear diet as well as antibiotics here and observe the patient provide additional IV fluid.  Discussed with the patient that if he does go home we would need to treat him with antibiotics nausea medicine and very careful follow-up with GI and return precautions with the patient is in agreement with.  Also discussed with the patient he has a small mass or some type of tumor that could potentially be a kidney cancer, he is agreeable to follow-up with his primary care doctor for further work-up of that as well.   Delman Kitten, MD 02/15/19 [phone]

## 2019-02-15 NOTE — ED Notes (Signed)
ED TO INPATIENT HANDOFF REPORT  ED Nurse Name and Phone #:  [place] Name/Age/Gender Todd Briggs 73 y.o. male Room/Bed: ED37A/ED37A  Code Status   Code Status: Full Code  Home/SNF/Other Home Patient oriented to: self, place, time and situation Is this baseline? Yes   Triage Complete: Triage complete  Chief Complaint Abdominal pain [R10.9]  Triage Note Pt arrived via EMS from home with RLQ abdominal pain since 2300 last night. Pain is accompanied with N/V. Hx/o gastritis.     Allergies Allergies  Allergen Reactions  . Penicillins Rash    Occurred as a child    Level of Care/Admitting Diagnosis ED Disposition    ED Disposition Condition [place]: [place] [100120]  Level of Care: Med-Surg [16]  Covid Evaluation: Asymptomatic Screening Protocol (No Symptoms)  Diagnosis: Abdominal pain [827078]  Admitting Physician: Ivor Costa [[id number]]  Attending Physician: Ivor Costa [[id number]]  PT Class (Do Not Modify): Observation [104]  PT Acc Code (Do Not Modify): Observation [10022]       B Medical/Surgery History Past Medical History:  Diagnosis Date  . Allergy    Seasonal  . Anemia   . Arthritis   . Basal cell carcinoma    Removed 1980's  . Cataracts, both eyes   . Diabetes mellitus without complication (HCC)    Borderline  . GERD (gastroesophageal reflux disease)   . IBS (irritable bowel syndrome)   . Peptic ulcer 73 years old  . Scarlet fever 73 years old  . Shortness of breath dyspnea    with exertion   Past Surgical History:  Procedure Laterality Date  . CHOLECYSTECTOMY N/A 03/30/2015   Procedure: LAPAROSCOPIC CHOLECYSTECTOMY;  Surgeon: Hubbard Robinson, MD;  Location: [facility] ORS;  Service: General;  Laterality: N/A;  . HERNIA REPAIR Bilateral 73 years old   Inguinal Hernia     A IV Location/Drains/Wounds Patient Lines/Drains/Airways Status   Active Line/Drains/Airways    Name:   Placement date:    Placement time:   Site:   Days:   Peripheral IV 02/15/19 Left Hand   02/15/19    0542    Hand   less than 1   Incision (Closed) 03/30/15 Abdomen   03/30/15    0851     1418          Intake/Output Last 24 hours  Intake/Output Summary (Last 24 hours) at 02/15/2019 1932 Last data filed at 02/15/2019 1040 Gross per 24 hour  Intake 1500 ml  Output --  Net 1500 ml    Labs/Imaging Results for orders placed or performed during the hospital encounter of 02/15/19 (from the past 48 hour(s))  CBC     Status: Abnormal   Collection Time: 02/15/19  5:23 AM  Result Value Ref Range   WBC 14.0 (H) 4.0 - 10.5 K/uL   RBC 5.27 4.22 - 5.81 MIL/uL   Hemoglobin 14.7 13.0 - 17.0 g/dL   HCT 44.3 39.0 - 52.0 %   MCV 84.1 80.0 - 100.0 fL   MCH 27.9 26.0 - 34.0 pg   MCHC 33.2 30.0 - 36.0 g/dL   RDW 13.0 11.5 - 15.5 %   Platelets 249 150 - 400 K/uL   nRBC 0.0 0.0 - 0.2 %    Comment: Performed at [facility], [address] [place], [place] [address]  Comprehensive metabolic panel     Status: Abnormal   Collection Time: 02/15/19  7:28 AM  Result  Value Ref Range   Sodium 138 135 - 145 mmol/L   Potassium 4.7 3.5 - 5.1 mmol/L    Comment: HEMOLYSIS AT THIS LEVEL MAY AFFECT RESULT   Chloride 105 98 - 111 mmol/L   CO2 23 22 - 32 mmol/L   Glucose, Bld 117 (H) 70 - 99 mg/dL   BUN 24 (H) 8 - 23 mg/dL   Creatinine, Ser 0.84 0.61 - 1.24 mg/dL   Calcium 8.0 (L) 8.9 - 10.3 mg/dL   Total Protein 6.2 (L) 6.5 - 8.1 g/dL   Albumin 3.5 3.5 - 5.0 g/dL   AST 32 15 - 41 U/L    Comment: HEMOLYSIS AT THIS LEVEL MAY AFFECT RESULT   ALT 17 0 - 44 U/L    Comment: HEMOLYSIS AT THIS LEVEL MAY AFFECT RESULT   Alkaline Phosphatase 42 38 - 126 U/L   Total Bilirubin 2.1 (H) 0.3 - 1.2 mg/dL    Comment: HEMOLYSIS AT THIS LEVEL MAY AFFECT RESULT   GFR calc non Af Amer >60 >60 mL/min   GFR calc Af Amer >60 >60 mL/min   Anion gap 10 5 - 15    Comment: Performed at [facility], [address]  [place], [place] [address]  Lipase, blood     Status: None   Collection Time: 02/15/19  7:28 AM  Result Value Ref Range   Lipase 25 11 - 51 U/L    Comment: Performed at [facility], [address] [place], [place] [address]  SARS CORONAVIRUS 2 (TAT 6-24 HRS) Nasopharyngeal Nasopharyngeal Swab     Status: None   Collection Time: 02/15/19 12:10 PM   Specimen: Nasopharyngeal Swab  Result Value Ref Range   SARS Coronavirus 2 NEGATIVE NEGATIVE    Comment: (NOTE) SARS-CoV-2 target nucleic acids are NOT DETECTED. The SARS-CoV-2 RNA is generally detectable in upper and lower respiratory specimens during the acute phase of infection. Negative results do not preclude SARS-CoV-2 infection, do not rule out co-infections with other pathogens, and should not be used as the sole basis for treatment or other patient management decisions. Negative results must be combined with clinical observations, patient history, and epidemiological information. The expected result is Negative. Fact Sheet for Patients: SugarRoll.be Fact Sheet for Healthcare Providers: https://www.woods-mathews.com/ This test is not yet approved or cleared by the [place] FDA and  has been authorized for detection and/or diagnosis of SARS-CoV-2 by FDA under an Emergency Use Authorization (EUA). This EUA will remain  in effect (meaning this test can be used) for the duration of the COVID-19 declaration under Section 56 4(b)(1) of the Act, 21 U.S.C. section 360bbb-3(b)(1), unless the authorization is terminated or revoked sooner. Performed at [facility], [place] [address], [place], [place] [address]    CT Abdomen Pelvis W Contrast  Result Date: 02/15/2019 CLINICAL DATA:  Abdominal pain since last evening. History of irritable bowel syndrome. EXAM: CT ABDOMEN AND PELVIS WITH CONTRAST TECHNIQUE: Multidetector CT imaging of the abdomen and pelvis was performed using the  standard protocol following bolus administration of intravenous contrast. CONTRAST:  112m OMNIPAQUE IOHEXOL 300 MG/ML  SOLN COMPARISON:  None. FINDINGS: Lower chest: Bibasilar scarring and subpleural atelectasis. No worrisome pulmonary lesions or pleural effusion. The heart is normal in size. No pericardial effusion. Hepatobiliary: A few tiny low-attenuation lesions are likely benign cysts. The gallbladder is surgically absent. There is mild associated intra and extrahepatic biliary dilatation. Pancreas: No mass, inflammation or ductal dilatation. Spleen: Normal size.  No focal  lesions. Adrenals/Urinary Tract: Small right adrenal gland nodule, unchanged since a chest CT from 2018 and consistent with a benign adenoma. The left adrenal gland is unremarkable. There is an indeterminate lesion in the lower pole region of the right kidney measuring approximately 21.5 mm. Findings suspicious for a small solid renal neoplasm. A hyperdense/hemorrhagic cyst is possible but this will need further evaluation with MRI. The left kidney is unremarkable. Stomach/Bowel: The stomach is unremarkable. The duodenum and proximal loops of jejunum appear relatively normal. Mild dilatation of the more distal jejunal loops with scattered air-fluid levels. Severe inflammatory or infectious ileitis is noted. There is marked mucosal and serosal enhancement and submucosal edema. The distal loops of ileum are very irregular and some of them demonstrate straightening and areas of traction. There also multiple ileal diverticuli. Some fibrofatty infiltrated type changes are suggestive of acute on chronic ileitis. The terminal ileum is also abnormal. Recommend GI consultation as patient could have Crohn's disease. This could be causing a mild functional obstruction but I do not see an obvious mechanical obstruction. No evidence of fistula or abscess. The colon is relatively decompressed. No colonic inflammatory process. Vascular/Lymphatic: Scattered  aortic calcifications but no aneurysm or dissection. The branch vessels are patent. The major venous structures are patent. 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 Small scattered mesenteric and retroperitoneal lymph nodes likely associated with the small bowel inflammatory process. There is also mild mesenteric edema and a small amount of mesenteric fluid. Reproductive: The prostate gland is slightly enlarged. The seminal vesicles appear normal. Other: No free pelvic fluid collections, pelvic abscess or adenopathy. No inguinal mass or adenopathy. Musculoskeletal: No significant bony findings. Moderate lower lumbar degenerative disc disease and facet disease. IMPRESSION: 1. Fairly extensive inflammatory or infectious ileitis as detailed above. I suspect this is acute on chronic disease. Crohn's disease is certainly a possibility. This may be causing a mild functional obstruction with dilated bowel but no mechanical obstruction is demonstrated. Patient may need GI consultation. 2. Indeterminate 2 cm lower pole right renal lesion worrisome for solid renal neoplasm. Recommend MRI abdomen without and with contrast for further evaluation. Patient may need urology consultation. 3. Scattered ileal diverticuli but no findings for diverticulitis. 4. Status post cholecystectomy.  No biliary dilatation. Electronically Signed   By: Marijo Sanes M.D.   On: 02/15/2019 08:22    Pending Labs Unresulted Labs (From admission, onward)    Start     Ordered   02/16/19 6160  Basic metabolic panel  Tomorrow morning,   STAT     02/15/19 1334   02/16/19 0500  CBC  Tomorrow morning,   STAT     02/15/19 1334   02/15/19 1206  CULTURE, BLOOD (ROUTINE X 2) w Reflex to ID Panel  BLOOD CULTURE X 2,   STAT     02/15/19 1205   02/15/19 0911  GI pathogen panel by PCR, stool  (Gastrointestinal Panel by PCR, Stool                                                                                                                                                      *  Does Not include CLOSTRIDIUM DIFFICILE testing.**If CDIFF testing is needed, select the C Difficile Quick Screen w PCR reflex order below)  Once,   STAT     02/15/19 0910   02/15/19 0911  C difficile quick scan w PCR reflex  (C Difficile quick screen w PCR reflex panel)  Once, for 24 hours,   STAT     02/15/19 0910   02/15/19 0522  Urinalysis, Complete w Microscopic  ONCE - STAT,   STAT     02/15/19 0521          Vitals/Pain Today's Vitals   02/15/19 1630 02/15/19 1700 02/15/19 1757 02/15/19 1930  BP: 122/71 120/70 124/78   Pulse: 96 91 93   Resp: (!) 23 (!) 22 (!) 22   Temp:   98.2 F (36.8 C)   TempSrc:   Oral   SpO2: 93% 94% 96%   Weight:      Height:      PainSc:    Asleep    Isolation Precautions Enteric precautions (UV disinfection)  Medications Medications  0.9 %  sodium chloride infusion (1,000 mLs Intravenous New Bag/Given 02/15/19 1813)  morphine 2 MG/ML injection 1 mg (1 mg Intravenous Given 02/15/19 1813)  ondansetron (ZOFRAN) injection 4 mg (has no administration in time range)  metroNIDAZOLE (FLAGYL) IVPB 500 mg (0 mg Intravenous Stopped 02/15/19 1930)  enoxaparin (LOVENOX) injection 40 mg (has no administration in time range)  acetaminophen (TYLENOL) tablet 650 mg (has no administration in time range)  ciprofloxacin (CIPRO) tablet 500 mg (has no administration in time range)  famotidine (PEPCID) tablet 20 mg (has no administration in time range)  ondansetron (ZOFRAN) injection 4 mg (4 mg Intravenous Given 02/15/19 0525)  sodium chloride 0.9 % bolus 1,000 mL (0 mLs Intravenous Stopped 02/15/19 0922)  morphine 4 MG/ML injection 4 mg (4 mg Intravenous Given 02/15/19 0552)  iohexol (OMNIPAQUE) 300 MG/ML solution 100 mL (100 mLs Intravenous Contrast Given 02/15/19 0603)  morphine 2 MG/ML injection 2 mg (2 mg Intravenous Given 02/15/19 0936)  ciprofloxacin (CIPRO) tablet 500 mg (500 mg Oral Given 02/15/19 0933)  metroNIDAZOLE (FLAGYL) tablet  500 mg (500 mg Oral Given 02/15/19 0933)  sodium chloride 0.9 % bolus 500 mL (0 mLs Intravenous Stopped 02/15/19 1040)    Mobility walks Low fall risk   Focused Assessments n/a   R Recommendations: See Admitting Provider Note  Report given to:   Additional Notes: n/a

## 2019-02-15 NOTE — ED Notes (Signed)
Pt given ginger ale and water

## 2019-02-15 NOTE — ED Provider Notes (Signed)
CT Abdomen Pelvis W Contrast  Result Date: 02/15/2019 CLINICAL DATA:  Abdominal pain since last evening. History of irritable bowel syndrome. EXAM: CT ABDOMEN AND PELVIS WITH CONTRAST TECHNIQUE: Multidetector CT imaging of the abdomen and pelvis was performed using the standard protocol following bolus administration of intravenous contrast. CONTRAST:  159m OMNIPAQUE IOHEXOL 300 MG/ML  SOLN COMPARISON:  None. FINDINGS: Lower chest: Bibasilar scarring and subpleural atelectasis. No worrisome pulmonary lesions or pleural effusion. The heart is normal in size. No pericardial effusion. Hepatobiliary: A few tiny low-attenuation lesions are likely benign cysts. The gallbladder is surgically absent. There is mild associated intra and extrahepatic biliary dilatation. Pancreas: No mass, inflammation or ductal dilatation. Spleen: Normal size.  No focal lesions. Adrenals/Urinary Tract: Small right adrenal gland nodule, unchanged since a chest CT from 2018 and consistent with a benign adenoma. The left adrenal gland is unremarkable. There is an indeterminate lesion in the lower pole region of the right kidney measuring approximately 21.5 mm. Findings suspicious for a small solid renal neoplasm. A hyperdense/hemorrhagic cyst is possible but this will need further evaluation with MRI. The left kidney is unremarkable. Stomach/Bowel: The stomach is unremarkable. The duodenum and proximal loops of jejunum appear relatively normal. Mild dilatation of the more distal jejunal loops with scattered air-fluid levels. Severe inflammatory or infectious ileitis is noted. There is marked mucosal and serosal enhancement and submucosal edema. The distal loops of ileum are very irregular and some of them demonstrate straightening and areas of traction. There also multiple ileal diverticuli. Some fibrofatty infiltrated type changes are suggestive of acute on chronic ileitis. The terminal ileum is also abnormal. Recommend GI consultation as  patient could have Crohn's disease. This could be causing a mild functional obstruction but I do not see an obvious mechanical obstruction. No evidence of fistula or abscess. The colon is relatively decompressed. No colonic inflammatory process. Vascular/Lymphatic: Scattered aortic calcifications but no aneurysm or dissection. The branch vessels are patent. The major venous structures are patent. 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 Small scattered mesenteric and retroperitoneal lymph nodes likely associated with the small bowel inflammatory process. There is also mild mesenteric edema and a small amount of mesenteric fluid. Reproductive: The prostate gland is slightly enlarged. The seminal vesicles appear normal. Other: No free pelvic fluid collections, pelvic abscess or adenopathy. No inguinal mass or adenopathy. Musculoskeletal: No significant bony findings. Moderate lower lumbar degenerative disc disease and facet disease. IMPRESSION: 1. Fairly extensive inflammatory or infectious ileitis as detailed above. I suspect this is acute on chronic disease. Crohn's disease is certainly a possibility. This may be causing a mild functional obstruction with dilated bowel but no mechanical obstruction is demonstrated. Patient may need GI consultation. 2. Indeterminate 2 cm lower pole right renal lesion worrisome for solid renal neoplasm. Recommend MRI abdomen without and with contrast for further evaluation. Patient may need urology consultation. 3. Scattered ileal diverticuli but no findings for diverticulitis. 4. Status post cholecystectomy.  No biliary dilatation. Electronically Signed   By: PMarijo SanesM.D.   On: 02/15/2019 08:22     Paged Dr. DJerrilyn Cairoto discuss/review case at [facility] ----------------------------------------- 9:05 AM on 02/15/2019 -----------------------------------------  Patient reports he started to have some of the pain returning in his right lower abdomen.  He is not he needs further  nausea or vomiting however.  He is awake and alert, will provide additional pain relief, awaiting to discuss case with gastroenterology.  Patient does remain slightly tachycardic at this time.  Delman Kitten, MD 02/15/19 [phone]

## 2019-02-15 NOTE — ED Provider Notes (Signed)
Patient report not having had something to eat and take antibiotics he started to feel bloated, increasing discomfort, nausea, feels like he possibly might throw up and he would like to stay tonight does not think he will be able to do well at home as his symptoms from last night seem to be coming back.  Will provide additional antiemetic, pain control, and admission to the hospital.  Notified Dr. Allen Norris as well for consult.   Delman Kitten, MD 02/15/19 1124

## 2019-02-15 NOTE — ED Triage Notes (Signed)
Pt arrived via EMS from home with RLQ abdominal pain since 2300 last night. Pain is accompanied with N/V. Hx/o gastritis.

## 2019-02-15 NOTE — ED Notes (Signed)
Informed pt we need a stool sample, pt unable to give sample at this time. Told pt to call when he can.

## 2019-02-15 NOTE — ED Provider Notes (Signed)
[facility] Emergency Department Provider Note  ____________________________________________   First MD Initiated Contact with Patient 02/15/19 0532     (approximate)  I have reviewed the triage vital signs and the nursing notes.  History  Chief Complaint Abdominal Pain    HPI Todd Briggs is a 73 y.o. male with history of IBS, DM, peptic ulcer disease who presents to the emergency department for lower abdominal pain.  Patient states symptoms started last night around 11 PM.  Symptoms have been constant since then.  Pain is located to the lower abdomen, right slightly greater than left.  Described as an aching type sensation.  No radiation.  No alleviating or aggravating factors.  Associated with nausea and vomiting.  No diarrhea.  He denies any dysuria, hematuria, or history of nephrolithiasis.  No history of diverticulitis to his knowledge.  No fevers, chills, chest pain, shortness of breath, or sick contacts.  Intra-abdominal surgical history includes bilateral hernia repairs, and cholecystectomy.   Past Medical Hx Past Medical History:  Diagnosis Date  . Allergy    Seasonal  . Anemia   . Arthritis   . Basal cell carcinoma    Removed 1980's  . Cataracts, both eyes   . Diabetes mellitus without complication (HCC)    Borderline  . GERD (gastroesophageal reflux disease)   . IBS (irritable bowel syndrome)   . Peptic ulcer 73 years old  . Scarlet fever 73 years old  . Shortness of breath dyspnea    with exertion    Problem List Patient Active Problem List   Diagnosis Date Noted  . IBS (irritable bowel syndrome) 03/01/2015  . Peptic ulcer 03/01/2015  . Anemia 03/01/2015  . Basal cell carcinoma 03/01/2015  . Type 2 diabetes mellitus ([place]) 01/23/2015  . 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 Allergic rhinitis, seasonal 01/18/2014    Past Surgical Hx Past Surgical History:  Procedure Laterality Date  . CHOLECYSTECTOMY  N/A 03/30/2015   Procedure: LAPAROSCOPIC CHOLECYSTECTOMY;  Surgeon: Hubbard Robinson, MD;  Location: [facility] ORS;  Service: General;  Laterality: N/A;  . HERNIA REPAIR Bilateral 73 years old   Inguinal Hernia    Medications Prior to Admission medications   Medication Sig Start Date End Date Taking? Authorizing Provider  ondansetron (ZOFRAN ODT) 8 MG disintegrating tablet Take 1 tablet (8 mg total) by mouth every 8 (eight) hours as needed for nausea or vomiting. 05/15/16   Frederich Cha, MD  ranitidine (ZANTAC) 150 MG capsule Take 3 times a day for the next 3-5 days with a dose of double strength Pepto-Bismol liquids or tablets 05/15/16   Frederich Cha, MD    Allergies Penicillins  Family Hx Family History  Problem Relation Age of Onset  . Hypertension Mother   . Diabetes Mother   . Pancreatitis Mother   . Heart disease Mother   . Heart attack Father   . COPD Father   . Heart disease Father     Social Hx Social History   Tobacco Use  . Smoking status: Former Smoker    Packs/day: 1.00    Types: Cigarettes    Quit date: 02/28/1985    Years since quitting: 33.9  . Smokeless tobacco: Never Used  Substance Use Topics  . Alcohol use: No  . Drug use: No     Review of Systems  Constitutional: Negative for fever, chills. Eyes: Negative for visual changes. ENT: Negative for sore throat. Cardiovascular: Negative for chest pain. Respiratory: Negative  for shortness of breath. Gastrointestinal: + for nausea, vomiting, lower abdominal pain.  Genitourinary: Negative for dysuria. Musculoskeletal: Negative for leg swelling. Skin: Negative for rash. Neurological: Negative for for headaches.   Physical Exam  Vital Signs: ED Triage Vitals [02/15/19 0522]  Enc Vitals Group     BP (!) 148/93     Pulse Rate (!) 116     Resp 18     Temp 99.6 F (37.6 C)     Temp Source Oral     SpO2 96 %     Weight      Height      Head Circumference      Peak Flow      Pain Score      Pain  Loc      Pain Edu?      Excl. in [place]?     Constitutional: Alert and oriented.  Head: Normocephalic. Atraumatic. Eyes: Conjunctivae clear. Sclera anicteric. Nose: No congestion. No rhinorrhea. Mouth/Throat: Wearing mask.  Neck: No stridor.   Cardiovascular: HR low 100s. Extremities well perfused. Respiratory: Normal respiratory effort.  Lungs CTAB. Gastrointestinal: Soft. TTP across lower abdomen, R slightly greater than L.  No rebound or guarding. Musculoskeletal: No lower extremity edema. No deformities. Neurologic:  Normal speech and language. No gross focal neurologic deficits are appreciated.  Skin: Skin is warm, dry and intact. No rash noted. Psychiatric: Mood and affect are appropriate for situation.  EKG  N/A    Radiology  CT A/P ordered, pending.   Procedures  Procedure(s) performed (including critical care):  Procedures   Initial Impression / Assessment and Plan / ED Course  73 y.o. male who presents to the ED for lower abdominal pain, nausea, vomiting  Ddx: diverticulitis, colitis, appendicitis, other viral process, nephrolithiasis   Will obtain labs, imaging, symptom control, reassess.  Labs reveal leukocytosis to 14. Awaiting remainder of labs, imaging. Patient care transferred to Dr. Jacqualine Code due to shift change.    Final Clinical Impression(s) / ED Diagnosis  Final diagnoses:  Lower abdominal pain       Note:  This document was prepared using Dragon voice recognition software and may include unintentional dictation errors.   Lilia Pro., MD 02/15/19 [phone]

## 2019-02-15 NOTE — H&P (Signed)
History and Physical    Todd Briggs:[id number] DOB: 01-29-1946 DOA: 02/15/2019  Referring MD/NP/PA:   PCP: Sofie Hartigan, MD   Patient coming from:  The patient is coming from home.  At baseline, pt is independent for most of ADL.        Chief Complaint: Abdominal pain  HPI: Todd Briggs is a 73 y.o. male with medical history significant of diet-controlled diabetes, GERD, peptic ulcer disease, anemia, IBS, who presents with abdominal pain  Patient states that his abdominal pain started last night, which is located in the right side of abdomen, moderate, sharp, nonradiating.  Associated with little diarrhea. She has some nausea vomiting with nonbilious nonbloody vomitus.  Patient does not have fever or chills.  No chest pain, shortness of breath, cough.  No symptoms of UTI or unilateral weakness.  ED Course: pt was found to have WBC 14.0, lipase 25, pending COVID-19 test, negative C. difficile, pending GI pathogen panel, electrolytes renal function okay, temperature 99.6, blood pressure 123/76, heart rate 103, RR 20, oxygen saturation 93 to 98% on room air.  Patient is placed on MedSurg bed for observation.  GI, Dr. Allen Norris was consulted.  # CT-abdomen/pelvis x-ray: 1. Fairly extensive inflammatory or infectious ileitis, suspecting this is acute on chronic disease. Crohn's disease cannot be ruled out. This may be causing a mild functional obstruction with dilated bowel but no mechanical obstruction is demonstrated. 2. Indeterminate 2 cm lower pole right renal lesion worrisome for solid renal neoplasm. 3. Scattered ileal diverticuli but no findings for diverticulitis. 4. Status post cholecystectomy.  No biliary dilatation.  Review of Systems:   General: no fevers, chills, no body weight gain, has poor appetite, has fatigue HEENT: no blurry vision, hearing changes or sore throat Respiratory: no dyspnea, coughing, wheezing CV: no chest pain, no palpitations GI: has nausea,  vomiting, abdominal pain, diarrhea, no constipation GU: no dysuria, burning on urination, increased urinary frequency, hematuria  Ext: no leg edema Neuro: no unilateral weakness, numbness, or tingling, no vision change or hearing loss Skin: no rash, no skin tear. MSK: No muscle spasm, no deformity, no limitation of range of movement in spin Heme: No easy bruising.  Travel history: No recent long distant travel.  Allergy:  Allergies  Allergen Reactions  . Penicillins Rash    Occurred as a child    Past Medical History:  Diagnosis Date  . Allergy    Seasonal  . Anemia   . Arthritis   . Basal cell carcinoma    Removed 1980's  . Cataracts, both eyes   . Diabetes mellitus without complication (HCC)    Borderline  . GERD (gastroesophageal reflux disease)   . IBS (irritable bowel syndrome)   . Peptic ulcer 73 years old  . Scarlet fever 73 years old  . Shortness of breath dyspnea    with exertion    Past Surgical History:  Procedure Laterality Date  . CHOLECYSTECTOMY N/A 03/30/2015   Procedure: LAPAROSCOPIC CHOLECYSTECTOMY;  Surgeon: Hubbard Robinson, MD;  Location: [facility] ORS;  Service: General;  Laterality: N/A;  . HERNIA REPAIR Bilateral 73 years old   Inguinal Hernia    Social History:  reports that he quit smoking about 33 years ago. His smoking use included cigarettes. He smoked 1.00 pack per day. He has never used smokeless tobacco. He reports that he does not drink alcohol or use drugs.  Family History:  Family History  Problem Relation Age of Onset  . Hypertension Mother   .  Diabetes Mother   . Pancreatitis Mother   . Heart disease Mother   . Heart attack Father   . COPD Father   . Heart disease Father      Prior to Admission medications   Not on File    Physical Exam: Vitals:   02/15/19 1300 02/15/19 1345 02/15/19 1400 02/15/19 1430  BP: 119/71  118/70 118/71  Pulse: 94  98 99  Resp: (!) 21  (!) 21 (!) 21  Temp:      TempSrc:      SpO2: 94%  94%  94%  Weight:  74.8 kg    Height:  5' 10"  (1.778 m)     General: Not in acute distress HEENT:       Eyes: PERRL, EOMI, no scleral icterus.       ENT: No discharge from the ears and nose, no pharynx injection, no tonsillar enlargement.        Neck: No JVD, no bruit, no mass felt. Heme: No neck lymph node enlargement. Cardiac: S1/S2, RRR, No murmurs, No gallops or rubs. Respiratory:  No rales, wheezing, rhonchi or rubs. GI: Soft, nondistended, has tenderness in right side of abdomen, no rebound pain, no organomegaly, BS present. GU: No hematuria Ext: No pitting leg edema bilaterally. 2+DP/PT pulse bilaterally. Musculoskeletal: No joint deformities, No joint redness or warmth, no limitation of ROM in spin. Skin: No rashes.  Neuro: Alert, oriented X3, cranial nerves II-XII grossly intact, moves all extremities normally.  Psych: Patient is not psychotic, no suicidal or hemocidal ideation.  Labs on Admission: I have personally reviewed following labs and imaging studies  CBC: Recent Labs  Lab 02/15/19 0523  WBC 14.0*  HGB 14.7  HCT 44.3  MCV 84.1  PLT 374   Basic Metabolic Panel: Recent Labs  Lab 02/15/19 0728  NA 138  K 4.7  CL 105  CO2 23  GLUCOSE 117*  BUN 24*  CREATININE 0.84  CALCIUM 8.0*   GFR: Estimated Creatinine Clearance: 78.5 mL/min (by C-G formula based on SCr of 0.84 mg/dL). Liver Function Tests: Recent Labs  Lab 02/15/19 0728  AST 32  ALT 17  ALKPHOS 42  BILITOT 2.1*  PROT 6.2*  ALBUMIN 3.5   Recent Labs  Lab 02/15/19 0728  LIPASE 25   No results for input(s): AMMONIA in the last 168 hours. Coagulation Profile: No results for input(s): INR, PROTIME in the last 168 hours. Cardiac Enzymes: No results for input(s): CKTOTAL, CKMB, CKMBINDEX, TROPONINI in the last 168 hours. BNP (last 3 results) No results for input(s): PROBNP in the last 8760 hours. HbA1C: No results for input(s): HGBA1C in the last 72 hours. CBG: No results for input(s):  GLUCAP in the last 168 hours. Lipid Profile: No results for input(s): CHOL, HDL, LDLCALC, TRIG, CHOLHDL, LDLDIRECT in the last 72 hours. Thyroid Function Tests: No results for input(s): TSH, T4TOTAL, FREET4, T3FREE, THYROIDAB in the last 72 hours. Anemia Panel: No results for input(s): VITAMINB12, FOLATE, FERRITIN, TIBC, IRON, RETICCTPCT in the last 72 hours. Urine analysis: No results found for: COLORURINE, APPEARANCEUR, LABSPEC, PHURINE, GLUCOSEU, HGBUR, BILIRUBINUR, KETONESUR, PROTEINUR, UROBILINOGEN, NITRITE, LEUKOCYTESUR Sepsis Labs: @LABRCNTIP (procalcitonin:4,lacticidven:4) )No results found for this or any previous visit (from the past 240 hour(s)).   Radiological Exams on Admission: CT Abdomen Pelvis W Contrast  Result Date: 02/15/2019 CLINICAL DATA:  Abdominal pain since last evening. History of irritable bowel syndrome. EXAM: CT ABDOMEN AND PELVIS WITH CONTRAST TECHNIQUE: Multidetector CT imaging of the abdomen and pelvis was  performed using the standard protocol following bolus administration of intravenous contrast. CONTRAST:  129m OMNIPAQUE IOHEXOL 300 MG/ML  SOLN COMPARISON:  None. FINDINGS: Lower chest: Bibasilar scarring and subpleural atelectasis. No worrisome pulmonary lesions or pleural effusion. The heart is normal in size. No pericardial effusion. Hepatobiliary: A few tiny low-attenuation lesions are likely benign cysts. The gallbladder is surgically absent. There is mild associated intra and extrahepatic biliary dilatation. Pancreas: No mass, inflammation or ductal dilatation. Spleen: Normal size.  No focal lesions. Adrenals/Urinary Tract: Small right adrenal gland nodule, unchanged since a chest CT from 2018 and consistent with a benign adenoma. The left adrenal gland is unremarkable. There is an indeterminate lesion in the lower pole region of the right kidney measuring approximately 21.5 mm. Findings suspicious for a small solid renal neoplasm. A hyperdense/hemorrhagic cyst  is possible but this will need further evaluation with MRI. The left kidney is unremarkable. Stomach/Bowel: The stomach is unremarkable. The duodenum and proximal loops of jejunum appear relatively normal. Mild dilatation of the more distal jejunal loops with scattered air-fluid levels. Severe inflammatory or infectious ileitis is noted. There is marked mucosal and serosal enhancement and submucosal edema. The distal loops of ileum are very irregular and some of them demonstrate straightening and areas of traction. There also multiple ileal diverticuli. Some fibrofatty infiltrated type changes are suggestive of acute on chronic ileitis. The terminal ileum is also abnormal. Recommend GI consultation as patient could have Crohn's disease. This could be causing a mild functional obstruction but I do not see an obvious mechanical obstruction. No evidence of fistula or abscess. The colon is relatively decompressed. No colonic inflammatory process. Vascular/Lymphatic: Scattered aortic calcifications but no aneurysm or dissection. The branch vessels are patent. The major venous structures are patent. 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 Small scattered mesenteric and retroperitoneal lymph nodes likely associated with the small bowel inflammatory process. There is also mild mesenteric edema and a small amount of mesenteric fluid. Reproductive: The prostate gland is slightly enlarged. The seminal vesicles appear normal. Other: No free pelvic fluid collections, pelvic abscess or adenopathy. No inguinal mass or adenopathy. Musculoskeletal: No significant bony findings. Moderate lower lumbar degenerative disc disease and facet disease. IMPRESSION: 1. Fairly extensive inflammatory or infectious ileitis as detailed above. I suspect this is acute on chronic disease. Crohn's disease is certainly a possibility. This may be causing a mild functional obstruction with dilated bowel but no mechanical obstruction is demonstrated.  Patient may need GI consultation. 2. Indeterminate 2 cm lower pole right renal lesion worrisome for solid renal neoplasm. Recommend MRI abdomen without and with contrast for further evaluation. Patient may need urology consultation. 3. Scattered ileal diverticuli but no findings for diverticulitis. 4. Status post cholecystectomy.  No biliary dilatation. Electronically Signed   By: PMarijo SanesM.D.   On: 02/15/2019 08:22     EKG: Not done in ED, will get one.   Assessment/Plan Principal Problem:   Abdominal pain Active Problems:   Diabetes mellitus without complication (HCC)   GERD (gastroesophageal reflux disease)   Renal lesion  Abdominal pain: CT scan showed ileitis versus Crohn's disease. Negative C. difficile, pending GI pathogen panel. GI, Dr. WAllen Norriswas consulted. -place on med-surg bed for obs -Cipro and Flagyl were started in ED -->will continue -f/u blood culture -prn Zofran for nausea, morphine for pain -IV fluid: 1.5 L normal saline, then 75 cc/h -f/u GI recommendations  Diet controlled diabetes mellitus without complication ([place]: Last A1c 5.8 on 06/05/17, well controled. Patient  is not taking med at home. Blood sugar 117 by BMP -check CBG qAM.  GERD (gastroesophageal reflux disease): -prn pepcid  Renal lesion: Incidental findings. CT showed indeterminate 2 cm lower pole right renal lesion. -this needs to be follow up with PCP and urologist   DVT ppx: SQ Lovenox Code Status: Full code Family Communication: None at bed side.   Disposition Plan:  Anticipate discharge back to previous home environment Consults called:  GI, Dr. Allen Norris Admission status: Med-surg bed for obs   Date of Service 02/15/2019    [facility]   If 7PM-7AM, please contact night-coverage [email or web address] Password [place] 02/15/2019, 3:29 PM

## 2019-02-15 NOTE — ED Notes (Signed)
Floor called to notify handoff is in and to call with any questions.

## 2019-02-15 NOTE — ED Notes (Signed)
Report received from Simmie Davies, RN.

## 2019-02-15 NOTE — Consult Note (Signed)
Pharmacy Antibiotic Note  Todd Briggs is a 73 y.o. male admitted on 02/15/2019 with intra-abdominal infection.  Pharmacy has been consulted for Ciprofloxacin dosing. GI advised to trial antibiotics and clear liquid diet outpatient, but patient stated that he feels bloated with increasing discomfort, nausea, and feels as if he may throw up and would like to stay inpatient for at least 1 day.  Plan: Ciprofloxacin 559m PO BID  Height: 5' 10"  (177.8 cm) Weight: 165 lb (74.8 kg) IBW/kg (Calculated) : 73  Temp (24hrs), Avg:99.6 F (37.6 C), Min:99.6 F (37.6 C), Max:99.6 F (37.6 C)  Recent Labs  Lab 02/15/19 0523 02/15/19 0728  WBC 14.0*  --   CREATININE  --  0.84    Estimated Creatinine Clearance: 78.5 mL/min (by C-G formula based on SCr of 0.84 mg/dL).    Allergies  Allergen Reactions  . Penicillins Rash    Occurred as a child    Antimicrobials this admission: Cipro 12/22 >>   Microbiology results: 12/22 BCx: pending 12/22 GI panel: pending  12/22 COVID test: pending   Thank you for allowing pharmacy to be a part of this patient's care.  Todd Dubonnet12/22/2020 2:31 PM

## 2019-02-15 NOTE — ED Notes (Signed)
Pt gave water to tolerate.

## 2019-02-16 ENCOUNTER — Observation Stay: Payer: Medicare HMO

## 2019-02-16 ENCOUNTER — Telehealth: Payer: Self-pay | Admitting: Urology

## 2019-02-16 DIAGNOSIS — N2889 Other specified disorders of kidney and ureter: Secondary | ICD-10-CM

## 2019-02-16 DIAGNOSIS — R109 Unspecified abdominal pain: Secondary | ICD-10-CM | POA: Diagnosis not present

## 2019-02-16 DIAGNOSIS — K529 Noninfective gastroenteritis and colitis, unspecified: Secondary | ICD-10-CM

## 2019-02-16 DIAGNOSIS — E119 Type 2 diabetes mellitus without complications: Secondary | ICD-10-CM | POA: Diagnosis not present

## 2019-02-16 DIAGNOSIS — K50014 Crohn's disease of small intestine with abscess: Secondary | ICD-10-CM

## 2019-02-16 LAB — CBC
HCT: 36.4 % — ABNORMAL LOW (ref 39.0–52.0)
Hemoglobin: 12.1 g/dL — ABNORMAL LOW (ref 13.0–17.0)
MCH: 28.5 pg (ref 26.0–34.0)
MCHC: 33.2 g/dL (ref 30.0–36.0)
MCV: 85.8 fL (ref 80.0–100.0)
Platelets: 193 10*3/uL (ref 150–400)
RBC: 4.24 MIL/uL (ref 4.22–5.81)
RDW: 13.5 % (ref 11.5–15.5)
WBC: 14.6 10*3/uL — ABNORMAL HIGH (ref 4.0–10.5)
nRBC: 0 % (ref 0.0–0.2)

## 2019-02-16 LAB — BASIC METABOLIC PANEL
Anion gap: 11 (ref 5–15)
BUN: 28 mg/dL — ABNORMAL HIGH (ref 8–23)
CO2: 23 mmol/L (ref 22–32)
Calcium: 7.1 mg/dL — ABNORMAL LOW (ref 8.9–10.3)
Chloride: 103 mmol/L (ref 98–111)
Creatinine, Ser: 1.19 mg/dL (ref 0.61–1.24)
GFR calc Af Amer: >60 mL/min (ref >60)
GFR calc non Af Amer: 59 mL/min — ABNORMAL LOW (ref >60)
Glucose, Bld: 98 mg/dL (ref 70–99)
Potassium: 3.2 mmol/L — ABNORMAL LOW (ref 3.5–5.1)
Sodium: 137 mmol/L (ref 135–145)

## 2019-02-16 LAB — MAGNESIUM: Magnesium: 1.6 mg/dL — ABNORMAL LOW (ref 1.7–2.4)

## 2019-02-16 MED ORDER — GADOBUTROL 1 MMOL/ML IV SOLN
7.0000 mL | Freq: Once | INTRAVENOUS | Status: AC | PRN
  Administered 2019-02-16: 7 mL via INTRAVENOUS

## 2019-02-16 MED ORDER — MAGNESIUM SULFATE 2 GM/50ML IV SOLN: 2.0000 g | Freq: Once | INTRAVENOUS | Status: DC

## 2019-02-16 MED ORDER — POTASSIUM CHLORIDE IN NACL 20-0.9 MEQ/L-% IV SOLN
INTRAVENOUS | Status: DC
  Filled 2019-02-16: qty 1000

## 2019-02-16 MED ORDER — CIPROFLOXACIN HCL 500 MG PO TABS: 500.0000 mg | ORAL_TABLET | Freq: Two times a day (BID) | ORAL | 0 refills | Status: DC

## 2019-02-16 MED ORDER — METRONIDAZOLE 500 MG PO TABS: 500.0000 mg | ORAL_TABLET | Freq: Three times a day (TID) | ORAL | 0 refills | Status: DC

## 2019-02-16 MED ORDER — CALCIUM GLUCONATE-NACL 1-0.675 GM/50ML-% IV SOLN
1.0000 g | Freq: Once | INTRAVENOUS | Status: AC
  Administered 2019-02-16: 1000 mg via INTRAVENOUS
  Filled 2019-02-16: qty 50

## 2019-02-16 NOTE — Progress Notes (Signed)
Per Dr. Manuella Ghazi okay for RN to DC enteric precautions.

## 2019-02-16 NOTE — Telephone Encounter (Signed)
Would you get him scheduled for an appointment with Dr. Erlene Quan or Dr. Diamantina Providence for a renal mass?

## 2019-02-16 NOTE — Progress Notes (Signed)
Todd Briggs  A and O x 4. VSS. Pt tolerating diet well. No complaints of pain or nausea. IV removed intact, prescriptions given. Pt voiced understanding of discharge instructions with no further questions. Pt discharged via wheelchair with NT.     Allergies as of 02/16/2019      Reactions   Penicillins Rash   Occurred as a child      Medication List    TAKE these medications   ciprofloxacin 500 MG tablet Commonly known as: CIPRO Take 1 tablet (500 mg total) by mouth 2 (two) times daily for 5 days.   ibuprofen 200 MG tablet Commonly known as: ADVIL Take 400-600 mg by mouth every 6 (six) hours as needed for fever or mild pain.   metroNIDAZOLE 500 MG tablet Commonly known as: Flagyl Take 1 tablet (500 mg total) by mouth 3 (three) times daily for 5 days.       Vitals:   02/16/19 0601 02/16/19 1213  BP: 123/77 (!) 141/78  Pulse: 94 98  Resp: 17 18  Temp: 98.6 F (37 C) 99 F (37.2 C)  SpO2: 94% 95%    Francesco Sor

## 2019-02-16 NOTE — Consult Note (Signed)
Todd Lame, MD [facility]  [address], [place], [place] [address] Phone: [phone] Fax : [phone]  Consultation  Referring Provider:     Dr. Jacqualine Code Primary Care Physician:  Sofie Hartigan, MD Primary Gastroenterologist: Althia Forts         Reason for Consultation:     Abdominal pain with abnormal CT  Date of Admission:  02/15/2019 Date of Consultation:  02/16/2019         HPI:   Todd Briggs is a 73 y.o. male who reports that he has been having abdominal pain in the right lower abdomen for the last 3 days.  The patient states the pain was getting worse and he was starting to have loose bowel movements.  The patient does have a history of IBS, diabetes mellitus, peptic ulcer disease and started to have pain that he reports that he never had before.  The patient reports that his abdominal pain was tender to the touch.  His CT scan of the abdomen yesterday showed:  IMPRESSION: 1. Fairly extensive inflammatory or infectious ileitis as detailed above. I suspect this is acute on chronic disease. Crohn's disease is certainly a possibility. This may be causing a mild functional obstruction with dilated bowel but no mechanical obstruction is demonstrated. Patient may need GI consultation. 2. Indeterminate 2 cm lower pole right renal lesion worrisome for solid renal neoplasm. Recommend MRI abdomen without and with contrast for further evaluation. Patient may need urology consultation. 3. Scattered ileal diverticuli but no findings for diverticulitis. 4. Status post cholecystectomy.  No biliary dilatation.  The patient was originally thought to be treated with antibiotics and sent home but the patient started to have loose bowel movements in the ER and it was recommended that the patient be admitted to the hospital.  The patient's white cell count on admission was 14.0 with a repeat of 14.6 this morning.  He reports that his diarrhea has stopped and he also reports that his  abdominal pain is better but not gone.  The patient has C. difficile and a GI panel ordered but no sample sent as of yet.  The patient's AST and ALT were normal including his alkaline phosphatase but his bilirubin was elevated 2.1.  Past Medical History:  Diagnosis Date  . Allergy    Seasonal  . Anemia   . Arthritis   . Basal cell carcinoma    Removed 1980's  . Cataracts, both eyes   . Diabetes mellitus without complication (HCC)    Borderline  . GERD (gastroesophageal reflux disease)   . IBS (irritable bowel syndrome)   . Peptic ulcer 73 years old  . Scarlet fever 73 years old  . Shortness of breath dyspnea    with exertion    Past Surgical History:  Procedure Laterality Date  . CHOLECYSTECTOMY N/A 03/30/2015   Procedure: LAPAROSCOPIC CHOLECYSTECTOMY;  Surgeon: Hubbard Robinson, MD;  Location: [facility] ORS;  Service: General;  Laterality: N/A;  . HERNIA REPAIR Bilateral 73 years old   Inguinal Hernia    Prior to Admission medications   Medication Sig Start Date End Date Taking? Authorizing Provider  ibuprofen (ADVIL) 200 MG tablet Take 400-600 mg by mouth every 6 (six) hours as needed for fever or mild pain.    [provider]    Family History  Problem Relation Age of Onset  . Hypertension Mother   . Diabetes Mother   . Pancreatitis Mother   . Heart disease Mother   .  Heart attack Father   . COPD Father   . Heart disease Father      Social History   Tobacco Use  . Smoking status: Former Smoker    Packs/day: 1.00    Types: Cigarettes    Quit date: 02/28/1985    Years since quitting: 33.9  . Smokeless tobacco: Never Used  Substance Use Topics  . Alcohol use: No  . Drug use: No    Allergies as of 02/15/2019 - Review Complete 02/15/2019  Allergen Reaction Noted  . Penicillins Rash 03/01/2015    Review of Systems:    All systems reviewed and negative except where noted in HPI.   Physical Exam:  Vital signs in last 24 hours: Temp:  [98.2 F (36.8  C)-98.6 F (37 C)] 98.6 F (37 C) (12/23 0601) Pulse Rate:  [91-103] 94 (12/23 0601) Resp:  [17-23] 17 (12/23 0601) BP: (111-137)/(67-80) 123/77 (12/23 0601) SpO2:  [92 %-97 %] 94 % (12/23 0601) Weight:  [74.8 kg] 74.8 kg (12/22 1345)   General:   Pleasant, cooperative in NAD Head:  Normocephalic and atraumatic. Eyes:   No icterus.   Conjunctiva pink. PERRLA. Ears:  Normal auditory acuity. Neck:  Supple; no masses or thyroidomegaly Lungs: Respirations even and unlabored. Lungs clear to auscultation bilaterally.   No wheezes, crackles, or rhonchi.  Heart:  Regular rate and rhythm;  Without murmur, clicks, rubs or gallops Abdomen:  Soft, nondistended, diffuse tenderness throughout the entire abdomen with tympany. Normal bowel sounds. No appreciable masses or hepatomegaly.  No rebound or guarding.  Rectal:  Not performed. Msk:  Symmetrical without gross deformities.    Extremities:  Without edema, cyanosis or clubbing. Neurologic:  Alert and oriented x3;  grossly normal neurologically. Skin:  Intact without significant lesions or rashes. Cervical Nodes:  No significant cervical adenopathy. Psych:  Alert and cooperative. Normal affect.  LAB RESULTS: Recent Labs    02/15/19 0523 02/16/19 0730  WBC 14.0* 14.6*  HGB 14.7 12.1*  HCT 44.3 36.4*  PLT 249 193   BMET Recent Labs    02/15/19 0728 02/16/19 0730  NA 138 137  K 4.7 3.2*  CL 105 103  CO2 23 23  GLUCOSE 117* 98  BUN 24* 28*  CREATININE 0.84 1.19  CALCIUM 8.0* 7.1*   LFT Recent Labs    02/15/19 0728  PROT 6.2*  ALBUMIN 3.5  AST 32  ALT 17  ALKPHOS 42  BILITOT 2.1*   PT/INR No results for input(s): LABPROT, INR in the last 72 hours.  STUDIES: CT Abdomen Pelvis W Contrast  Result Date: 02/15/2019 CLINICAL DATA:  Abdominal pain since last evening. History of irritable bowel syndrome. EXAM: CT ABDOMEN AND PELVIS WITH CONTRAST TECHNIQUE: Multidetector CT imaging of the abdomen and pelvis was performed  using the standard protocol following bolus administration of intravenous contrast. CONTRAST:  162m OMNIPAQUE IOHEXOL 300 MG/ML  SOLN COMPARISON:  None. FINDINGS: Lower chest: Bibasilar scarring and subpleural atelectasis. No worrisome pulmonary lesions or pleural effusion. The heart is normal in size. No pericardial effusion. Hepatobiliary: A few tiny low-attenuation lesions are likely benign cysts. The gallbladder is surgically absent. There is mild associated intra and extrahepatic biliary dilatation. Pancreas: No mass, inflammation or ductal dilatation. Spleen: Normal size.  No focal lesions. Adrenals/Urinary Tract: Small right adrenal gland nodule, unchanged since a chest CT from 2018 and consistent with a benign adenoma. The left adrenal gland is unremarkable. There is an indeterminate lesion in the lower pole region of the right  kidney measuring approximately 21.5 mm. Findings suspicious for a small solid renal neoplasm. A hyperdense/hemorrhagic cyst is possible but this will need further evaluation with MRI. The left kidney is unremarkable. Stomach/Bowel: The stomach is unremarkable. The duodenum and proximal loops of jejunum appear relatively normal. Mild dilatation of the more distal jejunal loops with scattered air-fluid levels. Severe inflammatory or infectious ileitis is noted. There is marked mucosal and serosal enhancement and submucosal edema. The distal loops of ileum are very irregular and some of them demonstrate straightening and areas of traction. There also multiple ileal diverticuli. Some fibrofatty infiltrated type changes are suggestive of acute on chronic ileitis. The terminal ileum is also abnormal. Recommend GI consultation as patient could have Crohn's disease. This could be causing a mild functional obstruction but I do not see an obvious mechanical obstruction. No evidence of fistula or abscess. The colon is relatively decompressed. No colonic inflammatory process. Vascular/Lymphatic:  Scattered aortic calcifications but no aneurysm or dissection. The branch vessels are patent. The major venous structures are patent. 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 Small scattered mesenteric and retroperitoneal lymph nodes likely associated with the small bowel inflammatory process. There is also mild mesenteric edema and a small amount of mesenteric fluid. Reproductive: The prostate gland is slightly enlarged. The seminal vesicles appear normal. Other: No free pelvic fluid collections, pelvic abscess or adenopathy. No inguinal mass or adenopathy. Musculoskeletal: No significant bony findings. Moderate lower lumbar degenerative disc disease and facet disease. IMPRESSION: 1. Fairly extensive inflammatory or infectious ileitis as detailed above. I suspect this is acute on chronic disease. Crohn's disease is certainly a possibility. This may be causing a mild functional obstruction with dilated bowel but no mechanical obstruction is demonstrated. Patient may need GI consultation. 2. Indeterminate 2 cm lower pole right renal lesion worrisome for solid renal neoplasm. Recommend MRI abdomen without and with contrast for further evaluation. Patient may need urology consultation. 3. Scattered ileal diverticuli but no findings for diverticulitis. 4. Status post cholecystectomy.  No biliary dilatation. Electronically Signed   By: Marijo Sanes M.D.   On: 02/15/2019 08:22      Impression / Plan:   Assessment: Principal Problem:   Abdominal pain Active Problems:   Diabetes mellitus without complication (HCC)   GERD (gastroesophageal reflux disease)   Renal lesion   Todd Briggs is a 73 y.o. y/o male with extensive inflammation in the ileum suggestive of infection versus Crohn's disease.  The patient states that he is passing more gas today than he was previously.  The patient did have a right renal lesion worrisome for a solid renal mass and I will leave the hospitalist to further evaluate this.   With the patient's nausea and vomiting and summation of his ileum it is unlikely that the patient will be able to tolerate a bowel prep.  Plan:  The patient symptoms are improving and I have discussed with the patient the need for a colonoscopy in the future.  The patient is passing more gas at the present time and his abdomen is less tender as reported by him.  I would recommend continued conservative management with antibiotics and when the patient is able to take a prep for colonoscopy then I would recommend doing so with attempted intubation of the terminal ileum and biopsies to rule out inflammatory bowel disease.  The patient has asked me for pain medication and I have deferred to the hospitalist but have mentioned to the patient that any narcotics can slow down  his intestinal motility thereby causing further distention and pain.  The patient has been explained the plan and agrees with it.  Thank you for involving me in the care of this patient.      LOS: 0 days   Todd Lame, MD  02/16/2019, 11:37 AM Pager [phone] 7am-5pm  Check AMION for 5pm -7am coverage and on weekends   Note: This dictation was prepared with Dragon dictation along with smaller phrase technology. Any transcriptional errors that result from this process are unintentional.

## 2019-02-16 NOTE — Consult Note (Signed)
PHARMACY CONSULT NOTE  Pharmacy Consult for Electrolyte Monitoring and Replacement   Recent Labs: Potassium (mmol/L)  Date Value  02/16/2019 3.2 (L)   Magnesium (mg/dL)  Date Value  02/16/2019 1.6 (L)   Calcium (mg/dL)  Date Value  02/16/2019 7.1 (L)   Albumin (g/dL)  Date Value  02/15/2019 3.5   Sodium (mmol/L)  Date Value  02/16/2019 137   Corrected Ca: 7.5 mg/dL  Assessment: 73 y.o. male with medical history significant of diet-controlled diabetes, GERD, peptic ulcer disease, anemia, IBS, who presents with abdominal pain. He is receiving normal saline IVMF at 75 mL/hr  Goal of Therapy:  Electrolytes WNL  Plan:   Add 20 mEq/L to normal saline IVMF and continue at 75 mL/hr  Replace magnesium with 2 grams IV magnesium sulfate  Replace calcium with 1 gram IV calcium gluconate  BMP, magnesium in am  Dallie Piles ,PharmD Clinical Pharmacist 02/16/2019 12:33 PM

## 2019-02-17 NOTE — Discharge Summary (Signed)
[facility] NAME: Todd Briggs    MR#:  [id number]  DATE OF BIRTH:  [birth year]  DATE OF ADMISSION:  02/15/2019   ADMITTING PHYSICIAN: Ivor Costa, MD  DATE OF DISCHARGE: 02/16/2019  8:05 PM  PRIMARY CARE PHYSICIAN: Sofie Hartigan, MD   ADMISSION DIAGNOSIS:  Ileitis [K52.9] Renal mass [N28.89] Abdominal pain [R10.9] DISCHARGE DIAGNOSIS:  Principal Problem:   Abdominal pain Active Problems:   Diabetes mellitus without complication (HCC)   GERD (gastroesophageal reflux disease)   Renal mass   Ileitis  SECONDARY DIAGNOSIS:   Past Medical History:  Diagnosis Date  . Allergy    Seasonal  . Anemia   . Arthritis   . Basal cell carcinoma    Removed 1980's  . Cataracts, both eyes   . Diabetes mellitus without complication (HCC)    Borderline  . GERD (gastroesophageal reflux disease)   . IBS (irritable bowel syndrome)   . Peptic ulcer 73 years old  . Scarlet fever 73 years old  . Shortness of breath dyspnea    with exertion   HOSPITAL COURSE:  BLAISE GRIESHABER is a 73 y.o. male with known history of diet-controlled diabetes, GERD, peptic ulcer disease, anemia, IBS, admitted for ileitis/colitis. The patient was originally thought to be treated with antibiotics and sent home but the patient started to have loose bowel movements so came back.  The patient's white cell count on admission was 14.0 with a repeat of 14.6 this morning.  He reports that his diarrhea has stopped and he also reports that his abdominal pain is better but not gone. The patient has C. difficile and a GI panel ordered but no sample sent as of yet as he never had any stool in the hospital. The patient's AST and ALT were normal including his alkaline phosphatase but his bilirubin was elevated 2.1 on admission  Abdominal pain: CT scan showed ileitis versus Crohn's disease. Negative C. difficile, and GI panel.  GI, Dr. Allen Norris evaluated the patient while in the hospital and  recommended outpatient follow-up for endoscopy/colonoscopy The patient symptoms are improving  - The patient is passing more gas at the present time and his abdomen is less tender as reported by him.  -Prefers to go home with antibiotics.  He is agreeable to have outpatient colonoscopy to evaluate terminal ileum and biopsies to rule out inflammatory bowel disease.   -I have explained to the patient that any narcotics can slow down his intestinal motility thereby causing further distention and pain. The patient understands the plan and agrees with it.  Diet controlled diabetes mellitus without complication ([place]): Last A1c 5.8 on 06/05/17, well controled. Patient is not taking med at home.   GERD (gastroesophageal reflux disease): -prn pepcid  Incidental renal mass:  Seen on CT abdomen and confirmed by MRI of the abdomen - indeterminate 2 cm lower pole right renal lesion. -Discussed with patient and family.  Outpatient referral to [facility] urology.  Patient is in agreement to follow-up with them for further evaluation of renal mass DISCHARGE CONDITIONS:  Stable CONSULTS OBTAINED:  Treatment Team:  [name], MD DRUG ALLERGIES:   Allergies  Allergen Reactions  . Penicillins Rash    Occurred as a child   DISCHARGE MEDICATIONS:   Allergies as of 02/16/2019      Reactions   Penicillins Rash   Occurred as a child      Medication List    TAKE these medications   ciprofloxacin  500 MG tablet Commonly known as: CIPRO Take 1 tablet (500 mg total) by mouth 2 (two) times daily for 5 days.   ibuprofen 200 MG tablet Commonly known as: ADVIL Take 400-600 mg by mouth every 6 (six) hours as needed for fever or mild pain.   metroNIDAZOLE 500 MG tablet Commonly known as: Flagyl Take 1 tablet (500 mg total) by mouth 3 (three) times daily for 5 days.      DISCHARGE INSTRUCTIONS:   DIET:  Regular diet DISCHARGE CONDITION:  Fair ACTIVITY:  Activity as tolerated OXYGEN:  Home  Oxygen: No.  Oxygen Delivery: room air DISCHARGE LOCATION:  home   If you experience worsening of your admission symptoms, develop shortness of breath, life threatening emergency, suicidal or homicidal thoughts you must seek medical attention immediately by calling 911 or calling your MD immediately  if symptoms less severe.  You Must read complete instructions/literature along with all the possible adverse reactions/side effects for all the Medicines you take and that have been prescribed to you. Take any new Medicines after you have completely understood and accpet all the possible adverse reactions/side effects.   Please note  You were cared for by a hospitalist during your hospital stay. If you have any questions about your discharge medications or the care you received while you were in the hospital after you are discharged, you can call the unit and asked to speak with the hospitalist on call if the hospitalist that took care of you is not available. Once you are discharged, your primary care physician will handle any further medical issues. Please note that NO REFILLS for any discharge medications will be authorized once you are discharged, as it is imperative that you return to your primary care physician (or establish a relationship with a primary care physician if you do not have one) for your aftercare needs so that they can reassess your need for medications and monitor your lab values.    On the day of Discharge:  VITAL SIGNS:  Blood pressure (!) 141/78, pulse 98, temperature 99 F (37.2 C), temperature source Oral, resp. rate 18, height 5' 10"  (1.778 m), weight 74.8 kg, SpO2 95 %. PHYSICAL EXAMINATION:  GENERAL:  74 y.o.-year-old patient lying in the bed with no acute distress.  EYES: Pupils equal, round, reactive to light and accommodation. No scleral icterus. Extraocular muscles intact.  HEENT: Head atraumatic, normocephalic. Oropharynx and nasopharynx clear.  NECK:  Supple, no  jugular venous distention. No thyroid enlargement, no tenderness.  LUNGS: Normal breath sounds bilaterally, no wheezing, rales,rhonchi or crepitation. No use of accessory muscles of respiration.  CARDIOVASCULAR: S1, S2 normal. No murmurs, rubs, or gallops.  ABDOMEN: Soft, non-tender, non-distended. Bowel sounds present. No organomegaly or mass.  EXTREMITIES: No pedal edema, cyanosis, or clubbing.  NEUROLOGIC: Cranial nerves II through XII are intact. Muscle strength 5/5 in all extremities. Sensation intact. Gait not checked.  PSYCHIATRIC: The patient is alert and oriented x 3.  SKIN: No obvious rash, lesion, or ulcer.  DATA REVIEW:   CBC Recent Labs  Lab 02/16/19 0730  WBC 14.6*  HGB 12.1*  HCT 36.4*  PLT 193    Chemistries  Recent Labs  Lab 02/15/19 0728 02/16/19 0730 02/16/19 0933  NA 138 137  --   K 4.7 3.2*  --   CL 105 103  --   CO2 23 23  --   GLUCOSE 117* 98  --   BUN 24* 28*  --   CREATININE 0.84 1.19  --  CALCIUM 8.0* 7.1*  --   MG  --   --  1.6*  AST 32  --   --   ALT 17  --   --   ALKPHOS 42  --   --   BILITOT 2.1*  --   --      Follow-up Information    Feldpausch, Chrissie Noa, MD. Go on 02/16/2019.   Specialty: Family Medicine Why: Post discharge follow-up. Office will call you for your follow up. Contact information: [place] [address] [phone]        [name], MD. Schedule an appointment as soon as possible for a visit in 2 weeks.   Specialty: Gastroenterology Why: Post discharge follow-up. Please call office to make your follow up. Contact information: [address] [place]  [place] [address] [phone]        Hollice Espy, MD. Go on 03/23/2019.   Specialty: Urology Why: 2 cm renal mass on the right kidney. Go at 2:00pm with Dr. Franki Monte. Contact information: [place] [address] [phone]           Management plans discussed with the patient, family and they are in  agreement.  CODE STATUS: Prior   TOTAL TIME TAKING CARE OF THIS PATIENT: 45 minutes.    Max Sane M.D on 02/17/2019 at 4:08 PM  Between 7am to 6pm - Pager - [phone]  After 6pm go to [email or web address] - password [email or web address]  [facility]   CC: Primary care physician; Sofie Hartigan, MD   Note: This dictation was prepared with Dragon dictation along with smaller phrase technology. Any transcriptional errors that result from this process are unintentional.

## 2019-02-19 NOTE — Progress Notes (Signed)
Received a call from his wife.  Patient is having abdominal pain, she feels he is not able to pass the gas.  Requested her to give him over-the-counter milk of magnesia/simethicone 3 times a day and MiraLAX/Colace.  If no improvement in 24 hours, consider calling PCP/GI office.  If not, he may need to come back to the emergency department for any clinical worsening.

## 2019-02-20 LAB — CULTURE, BLOOD (ROUTINE X 2)
Culture: NO GROWTH
Culture: NO GROWTH
Special Requests: ADEQUATE
Special Requests: ADEQUATE

## 2019-02-21 ENCOUNTER — Other Ambulatory Visit: Payer: Self-pay

## 2019-02-21 ENCOUNTER — Encounter: Payer: Self-pay | Admitting: Emergency Medicine

## 2019-02-21 ENCOUNTER — Inpatient Hospital Stay
Admission: EM | Admit: 2019-02-21 | Discharge: 2019-02-28 | DRG: 385 | Disposition: A | Discharge status: Home / Self Care | Payer: Medicare HMO | Attending: Internal Medicine | Admitting: Internal Medicine

## 2019-02-21 ENCOUNTER — Telehealth: Payer: Self-pay | Admitting: Gastroenterology

## 2019-02-21 ENCOUNTER — Emergency Department: Payer: Medicare HMO

## 2019-02-21 DIAGNOSIS — Z8711 Personal history of peptic ulcer disease: Secondary | ICD-10-CM | POA: Diagnosis not present

## 2019-02-21 DIAGNOSIS — R1031 Right lower quadrant pain: Secondary | ICD-10-CM

## 2019-02-21 DIAGNOSIS — N2889 Other specified disorders of kidney and ureter: Secondary | ICD-10-CM | POA: Diagnosis present

## 2019-02-21 DIAGNOSIS — M1990 Unspecified osteoarthritis, unspecified site: Secondary | ICD-10-CM | POA: Diagnosis present

## 2019-02-21 DIAGNOSIS — K580 Irritable bowel syndrome with diarrhea: Secondary | ICD-10-CM | POA: Diagnosis present

## 2019-02-21 DIAGNOSIS — E876 Hypokalemia: Secondary | ICD-10-CM | POA: Diagnosis present

## 2019-02-21 DIAGNOSIS — Z880 Allergy status to penicillin: Secondary | ICD-10-CM

## 2019-02-21 DIAGNOSIS — J90 Pleural effusion, not elsewhere classified: Secondary | ICD-10-CM | POA: Diagnosis present

## 2019-02-21 DIAGNOSIS — J984 Other disorders of lung: Secondary | ICD-10-CM | POA: Diagnosis present

## 2019-02-21 DIAGNOSIS — K50014 Crohn's disease of small intestine with abscess: Secondary | ICD-10-CM | POA: Diagnosis present

## 2019-02-21 DIAGNOSIS — Z79899 Other long term (current) drug therapy: Secondary | ICD-10-CM | POA: Diagnosis not present

## 2019-02-21 DIAGNOSIS — Z20822 Contact with and (suspected) exposure to covid-19: Secondary | ICD-10-CM | POA: Diagnosis present

## 2019-02-21 DIAGNOSIS — K219 Gastro-esophageal reflux disease without esophagitis: Secondary | ICD-10-CM | POA: Diagnosis present

## 2019-02-21 DIAGNOSIS — E119 Type 2 diabetes mellitus without complications: Secondary | ICD-10-CM | POA: Diagnosis present

## 2019-02-21 DIAGNOSIS — K631 Perforation of intestine (nontraumatic): Secondary | ICD-10-CM | POA: Diagnosis present

## 2019-02-21 DIAGNOSIS — Z833 Family history of diabetes mellitus: Secondary | ICD-10-CM

## 2019-02-21 DIAGNOSIS — R7989 Other specified abnormal findings of blood chemistry: Secondary | ICD-10-CM | POA: Diagnosis present

## 2019-02-21 DIAGNOSIS — Z87891 Personal history of nicotine dependence: Secondary | ICD-10-CM | POA: Diagnosis not present

## 2019-02-21 DIAGNOSIS — D638 Anemia in other chronic diseases classified elsewhere: Secondary | ICD-10-CM | POA: Diagnosis present

## 2019-02-21 DIAGNOSIS — J9811 Atelectasis: Secondary | ICD-10-CM | POA: Diagnosis present

## 2019-02-21 DIAGNOSIS — Z85828 Personal history of other malignant neoplasm of skin: Secondary | ICD-10-CM

## 2019-02-21 DIAGNOSIS — R197 Diarrhea, unspecified: Secondary | ICD-10-CM

## 2019-02-21 DIAGNOSIS — K5700 Diverticulitis of small intestine with perforation and abscess without bleeding: Secondary | ICD-10-CM | POA: Diagnosis present

## 2019-02-21 DIAGNOSIS — R188 Other ascites: Secondary | ICD-10-CM

## 2019-02-21 HISTORY — DX: Diarrhea, unspecified: R19.7

## 2019-02-21 HISTORY — DX: Hypokalemia: E87.6

## 2019-02-21 HISTORY — DX: Perforation of intestine (nontraumatic): K63.1

## 2019-02-21 LAB — COMPREHENSIVE METABOLIC PANEL
ALT: 22 U/L (ref 0–44)
AST: 32 U/L (ref 15–41)
Albumin: 2.4 g/dL — ABNORMAL LOW (ref 3.5–5.0)
Alkaline Phosphatase: 106 U/L (ref 38–126)
Anion gap: 11 (ref 5–15)
BUN: 13 mg/dL (ref 8–23)
CO2: 27 mmol/L (ref 22–32)
Calcium: 7.5 mg/dL — ABNORMAL LOW (ref 8.9–10.3)
Chloride: 97 mmol/L — ABNORMAL LOW (ref 98–111)
Creatinine, Ser: 0.98 mg/dL (ref 0.61–1.24)
GFR calc Af Amer: >60 mL/min (ref >60)
GFR calc non Af Amer: >60 mL/min (ref >60)
Glucose, Bld: 123 mg/dL — ABNORMAL HIGH (ref 70–99)
Potassium: 2.8 mmol/L — ABNORMAL LOW (ref 3.5–5.1)
Sodium: 135 mmol/L (ref 135–145)
Total Bilirubin: 0.8 mg/dL (ref 0.3–1.2)
Total Protein: 5.6 g/dL — ABNORMAL LOW (ref 6.5–8.1)

## 2019-02-21 LAB — LIPASE, BLOOD: Lipase: 33 U/L (ref 11–51)

## 2019-02-21 LAB — MAGNESIUM: Magnesium: 1.9 mg/dL (ref 1.7–2.4)

## 2019-02-21 LAB — C DIFFICILE QUICK SCREEN W PCR REFLEX
C Diff antigen: NEGATIVE
C Diff interpretation: NOT DETECTED
C Diff toxin: NEGATIVE

## 2019-02-21 LAB — CBC
HCT: 36.8 % — ABNORMAL LOW (ref 39.0–52.0)
Hemoglobin: 12.4 g/dL — ABNORMAL LOW (ref 13.0–17.0)
MCH: 27.6 pg (ref 26.0–34.0)
MCHC: 33.7 g/dL (ref 30.0–36.0)
MCV: 81.8 fL (ref 80.0–100.0)
Platelets: 380 10*3/uL (ref 150–400)
RBC: 4.5 MIL/uL (ref 4.22–5.81)
RDW: 13.3 % (ref 11.5–15.5)
WBC: 16.4 10*3/uL — ABNORMAL HIGH (ref 4.0–10.5)
nRBC: 0 % (ref 0.0–0.2)

## 2019-02-21 LAB — URINALYSIS, COMPLETE (UACMP) WITH MICROSCOPIC
Bacteria, UA: NONE SEEN
Bilirubin Urine: NEGATIVE
Glucose, UA: NEGATIVE mg/dL
Ketones, ur: 20 mg/dL — AB
Nitrite: NEGATIVE
Protein, ur: 30 mg/dL — AB
Specific Gravity, Urine: 1.025 (ref 1.005–1.030)
pH: 6 (ref 5.0–8.0)

## 2019-02-21 LAB — PHOSPHORUS: Phosphorus: 3.2 mg/dL (ref 2.5–4.6)

## 2019-02-21 MED ORDER — POTASSIUM CHLORIDE IN NACL 40-0.9 MEQ/L-% IV SOLN
INTRAVENOUS | Status: DC
  Administered 2019-02-21: 75 mL/h via INTRAVENOUS
  Filled 2019-02-21 (×3): qty 1000

## 2019-02-21 MED ORDER — SODIUM CHLORIDE 0.9 % IV SOLN
2.0000 g | Freq: Once | INTRAVENOUS | Status: AC
  Administered 2019-02-21: 2 g via INTRAVENOUS
  Filled 2019-02-21: qty 2

## 2019-02-21 MED ORDER — ACETAMINOPHEN 325 MG PO TABS
650.0000 mg | ORAL_TABLET | Freq: Four times a day (QID) | ORAL | Status: DC | PRN
  Administered 2019-02-21 – 2019-02-27 (×14): 650 mg via ORAL
  Filled 2019-02-21 (×14): qty 2

## 2019-02-21 MED ORDER — SODIUM CHLORIDE 0.9% FLUSH
3.0000 mL | Freq: Once | INTRAVENOUS | Status: AC
  Administered 2019-02-21: 3 mL via INTRAVENOUS

## 2019-02-21 MED ORDER — ACETAMINOPHEN 650 MG RE SUPP: 650.0000 mg | Freq: Four times a day (QID) | RECTAL | Status: DC | PRN

## 2019-02-21 MED ORDER — POTASSIUM CHLORIDE CRYS ER 20 MEQ PO TBCR
40.0000 meq | EXTENDED_RELEASE_TABLET | Freq: Once | ORAL | Status: AC
  Administered 2019-02-21: 40 meq via ORAL
  Filled 2019-02-21: qty 2

## 2019-02-21 MED ORDER — KCL IN DEXTROSE-NACL 40-5-0.9 MEQ/L-%-% IV SOLN
INTRAVENOUS | Status: DC
  Filled 2019-02-21 (×16): qty 1000

## 2019-02-21 MED ORDER — POTASSIUM CHLORIDE 10 MEQ/100ML IV SOLN
10.0000 meq | Freq: Once | INTRAVENOUS | Status: AC
  Administered 2019-02-21: 10 meq via INTRAVENOUS
  Filled 2019-02-21: qty 100

## 2019-02-21 MED ORDER — SODIUM CHLORIDE 0.9 % IV SOLN
2.0000 g | Freq: Three times a day (TID) | INTRAVENOUS | Status: DC
  Administered 2019-02-22 – 2019-02-28 (×20): 2 g via INTRAVENOUS
  Filled 2019-02-21 (×24): qty 2

## 2019-02-21 MED ORDER — ONDANSETRON HCL 4 MG/2ML IJ SOLN: 4.0000 mg | Freq: Four times a day (QID) | INTRAMUSCULAR | Status: DC | PRN

## 2019-02-21 MED ORDER — SODIUM CHLORIDE 0.9 % IV BOLUS
500.0000 mL | Freq: Once | INTRAVENOUS | Status: AC
  Administered 2019-02-21: 500 mL via INTRAVENOUS

## 2019-02-21 MED ORDER — METRONIDAZOLE IN NACL 5-0.79 MG/ML-% IV SOLN
500.0000 mg | Freq: Once | INTRAVENOUS | Status: AC
  Administered 2019-02-21: 500 mg via INTRAVENOUS
  Filled 2019-02-21: qty 100

## 2019-02-21 MED ORDER — SODIUM CHLORIDE 0.9 % IV BOLUS
1000.0000 mL | Freq: Once | INTRAVENOUS | Status: AC
  Administered 2019-02-21: 1000 mL via INTRAVENOUS

## 2019-02-21 MED ORDER — LORATADINE 10 MG PO TABS
10.0000 mg | ORAL_TABLET | Freq: Every day | ORAL | Status: DC
  Administered 2019-02-22 – 2019-02-25 (×3): 10 mg via ORAL
  Filled 2019-02-21 (×6): qty 1

## 2019-02-21 MED ORDER — METRONIDAZOLE IN NACL 5-0.79 MG/ML-% IV SOLN
500.0000 mg | Freq: Three times a day (TID) | INTRAVENOUS | Status: DC
  Administered 2019-02-22 – 2019-02-28 (×19): 500 mg via INTRAVENOUS
  Filled 2019-02-21 (×22): qty 100

## 2019-02-21 MED ORDER — ENOXAPARIN SODIUM 40 MG/0.4ML ~~LOC~~ SOLN
40.0000 mg | SUBCUTANEOUS | Status: DC
  Administered 2019-02-22: 40 mg via SUBCUTANEOUS
  Filled 2019-02-21: qty 0.4

## 2019-02-21 MED ORDER — ONDANSETRON HCL 4 MG PO TABS
4.0000 mg | ORAL_TABLET | Freq: Four times a day (QID) | ORAL | Status: DC | PRN
  Filled 2019-02-21: qty 1

## 2019-02-21 MED ORDER — ENOXAPARIN SODIUM 40 MG/0.4ML ~~LOC~~ SOLN: 40.0000 mg | SUBCUTANEOUS | Status: DC

## 2019-02-21 MED ORDER — IOHEXOL 300 MG/ML  SOLN
100.0000 mL | Freq: Once | INTRAMUSCULAR | Status: AC | PRN
  Administered 2019-02-21: 100 mL via INTRAVENOUS
  Filled 2019-02-21: qty 100

## 2019-02-21 NOTE — H&P (Addendum)
History and Physical:    Todd Briggs   JFH:[id number] DOB: 02-16-1946 DOA: 02/21/2019  Referring MD/provider: Dr. Donell Beers PCP: Todd Hartigan, MD   Patient coming from: Home  Chief Complaint: Abdominal pain  History of Present Illness:   Todd Briggs is an 73 y.o. 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  At that time, he was discharged on ciprofloxacin and Flagyl.  He presented to the hospital today because of worsening diarrhea, generalized weakness and abdominal pain.  He describes the pain as "gas pain".  It was quite severe.  It was nonradiating and there was no known relieving or aggravating factors.  Pain was mainly located in the mid abdomen and right lower quadrant area.  He said he had loose watery stools almost every 30 minutes yesterday.  His symptoms were associated with abdominal bloating.  Today, he's only had 2 watery stools.  Stools are nonbloody.  He does not have any fever, chills, nausea, vomiting.   ED Course:  The patient had a CT scan of the abdomen and pelvis which showed persistent changes likely related to inflammatory bowel disease within the distal ileum with interval development of multiple air-fluid collections/abscesses within the right mid and central abdomen.  He was given IV antibiotics, oral and IV potassium and IV fluids in the ED.  He was seen in consultation by the general surgeon who recommended conservative management.  ROS:   ROS all other systems reviewed were negative  Past Medical History:   Past Medical History:  Diagnosis Date  . Allergy    Seasonal  . Anemia   . Arthritis   . Basal cell carcinoma    Removed 1980's  . Cataracts, both eyes   . Diabetes mellitus without complication (HCC)     Borderline  . GERD (gastroesophageal reflux disease)   . IBS (irritable bowel syndrome)   . Peptic ulcer 73 years old  . Scarlet fever 73 years old  . Shortness of breath dyspnea    with exertion    Past Surgical History:   Past Surgical History:  Procedure Laterality Date  . CHOLECYSTECTOMY N/A 03/30/2015   Procedure: LAPAROSCOPIC CHOLECYSTECTOMY;  Surgeon: Hubbard Robinson, MD;  Location: [facility] ORS;  Service: General;  Laterality: N/A;  . HERNIA REPAIR Bilateral 73 years old   Inguinal Hernia    Social History:   Social History   Socioeconomic History  . Marital status: Married    Spouse name: Not on file  . Number of children: Not on file  . Years of education: Not on file  . Highest education level: Not on file  Occupational History  . Not on file  Tobacco Use  . Smoking status: Former Smoker    Packs/day: 1.00    Types: Cigarettes    Quit date: 02/28/1985    Years since quitting: 34.0  . Smokeless tobacco: Never Used  Substance and Sexual Activity  . Alcohol use: No  . Drug use: No  . Sexual activity: Not on file  Other Topics Concern  . Not on file  Social History Narrative  . Not on file   Social Determinants of Health   Financial Resource Strain:   . Difficulty of Paying Living Expenses: Not on file  Food Insecurity:   . Worried About Charity fundraiser in  the Last Year: Not on file  . Ran Out of Food in the Last Year: Not on file  Transportation Needs:   . Lack of Transportation (Medical): Not on file  . Lack of Transportation (Non-Medical): Not on file  Physical Activity:   . Days of Exercise per Week: Not on file  . Minutes of Exercise per Session: Not on file  Stress:   . Feeling of Stress : Not on file  Social Connections:   . Frequency of Communication with Friends and Family: Not on file  . Frequency of Social Gatherings with Friends and Family: Not on file  . Attends Religious Services: Not on file  . Active Member of Clubs or  Organizations: Not on file  . Attends Archivist Meetings: Not on file  . Marital Status: Not on file  Intimate Partner Violence:   . Fear of Current or Ex-Partner: Not on file  . Emotionally Abused: Not on file  . Physically Abused: Not on file  . Sexually Abused: Not on file    Allergies   Penicillins  Family history:   Family History  Problem Relation Age of Onset  . Hypertension Mother   . Diabetes Mother   . Pancreatitis Mother   . Heart disease Mother   . Heart attack Father   . COPD Father   . Heart disease Father     Current Medications:   Prior to Admission medications   Medication Sig Start Date End Date Taking? Authorizing Provider  cetirizine (ZYRTEC) 10 MG tablet Take 10 mg by mouth daily.   Yes [provider]  ibuprofen (ADVIL) 200 MG tablet Take 400-600 mg by mouth every 6 (six) hours as needed for fever or mild pain.   Yes [provider]    Physical Exam:   Vitals:   02/21/19 0845 02/21/19 0846 02/21/19 0854  BP: (!) 143/78    Pulse: 88    Resp: 17    Temp: 98.1 F (36.7 C)    TempSrc: Oral    SpO2: 95%    Weight:  74.8 kg 74.8 kg  Height:  5' 10"  (1.778 m) 5' 10"  (1.778 m)     Physical Exam: Blood pressure (!) 143/78, pulse 88, temperature 98.1 F (36.7 C), temperature source Oral, resp. rate 17, height 5' 10"  (1.778 m), weight 74.8 kg, SpO2 95 %. Gen: No acute distress. Head: Normocephalic, atraumatic. Eyes: Pupils equal, round and reactive to light. Extraocular movements intact.  Sclerae nonicteric.  Mouth: Dry mucous membranes Neck: Supple, no thyromegaly, no lymphadenopathy, no jugular venous distention. Chest: Lungs are clear to auscultation with good air movement. No rales, rhonchi or wheezes.  CV: Heart sounds are regular with an S1, S2. No murmurs, rubs, or gallops. Abdomen: Soft, nontender, nondistended with normal active bowel sounds. No hepatosplenomegaly or palpable masses. Extremities:  Extremities are without clubbing, or cyanosis. No edema. Pedal pulses 2+. Skin: Warm and dry.  Diffuse erythematous maculopapular rash on his back Neuro: Alert and oriented times 3; grossly nonfocal.  Psych: Insight is good and judgment is appropriate. Mood and affect normal.   Data Review:    Labs: Basic Metabolic Panel: Recent Labs  Lab 02/15/19 0728 02/16/19 0730 02/16/19 0933 02/21/19 0900  NA 138 137  --  135  K 4.7 3.2*  --  2.8*  CL 105 103  --  97*  CO2 23 23  --  27  GLUCOSE 117* 98  --  123*  BUN 24* 28*  --  13  CREATININE 0.84 1.19  --  0.98  CALCIUM 8.0* 7.1*  --  7.5*  MG  --   --  1.6*  --    Liver Function Tests: Recent Labs  Lab 02/15/19 0728 02/21/19 0900  AST 32 32  ALT 17 22  ALKPHOS 42 106  BILITOT 2.1* 0.8  PROT 6.2* 5.6*  ALBUMIN 3.5 2.4*   Recent Labs  Lab 02/15/19 0728 02/21/19 0900  LIPASE 25 33   No results for input(s): AMMONIA in the last 168 hours. CBC: Recent Labs  Lab 02/15/19 0523 02/16/19 0730 02/21/19 0900  WBC 14.0* 14.6* 16.4*  HGB 14.7 12.1* 12.4*  HCT 44.3 36.4* 36.8*  MCV 84.1 85.8 81.8  PLT 249 193 380   Cardiac Enzymes: No results for input(s): CKTOTAL, CKMB, CKMBINDEX, TROPONINI in the last 168 hours.  BNP (last 3 results) No results for input(s): PROBNP in the last 8760 hours. CBG: No results for input(s): GLUCAP in the last 168 hours.  Urinalysis    Component Value Date/Time   COLORURINE AMBER (A) 02/21/2019 0900   APPEARANCEUR HAZY (A) 02/21/2019 0900   LABSPEC 1.025 02/21/2019 0900   PHURINE 6.0 02/21/2019 0900   GLUCOSEU NEGATIVE 02/21/2019 0900   HGBUR SMALL (A) 02/21/2019 0900   BILIRUBINUR NEGATIVE 02/21/2019 0900   KETONESUR 20 (A) 02/21/2019 0900   PROTEINUR 30 (A) 02/21/2019 0900   NITRITE NEGATIVE 02/21/2019 0900   LEUKOCYTESUR SMALL (A) 02/21/2019 0900      Radiographic Studies: DG Abdomen 1 View  Result Date: 02/21/2019 CLINICAL DATA:  Abdominal bloating EXAM: ABDOMEN - 1  VIEW COMPARISON:  None. FINDINGS: Mildly distended loops of small bowel in the left upper quadrant. No significant stool burden. IMPRESSION: Mildly distended small bowel loops in the left upper quadrant. Electronically Signed   By: Macy Mis M.D.   On: 02/21/2019 12:43   CT abd  Result Date: 02/21/2019 CLINICAL DATA:  Abdominal pain and distension EXAM: CT ABDOMEN AND PELVIS WITH CONTRAST TECHNIQUE: Multidetector CT imaging of the abdomen and pelvis was performed using the standard protocol following bolus administration of intravenous contrast. CONTRAST:  175m OMNIPAQUE IOHEXOL 300 MG/ML  SOLN COMPARISON:  02/15/2019 FINDINGS: Lower chest: Small bilateral pleural effusions are noted right greater than left with associated right basilar atelectatic changes. Scattered calcified granulomas are seen. Hepatobiliary: Tiny hypodensity is noted in the dome of the liver posteriorly best seen on image number 9 of series 2 stable from the prior exam. A few scattered hypodensities are seen stable from the prior study. Status post cholecystectomy. No biliary dilatation. Pancreas: Unremarkable. No pancreatic ductal dilatation or surrounding inflammatory changes. Spleen: Normal in size without focal abnormality. Adrenals/Urinary Tract: Adrenal glands are stable with a small right adrenal nodule identified. Normal enhancement of the kidneys is seen with the exception of a right lower pole mildly enhancing lesion which is stable from the prior exam. No obstructive changes are seen. The bladder is partially distended. Stomach/Bowel: Colon shows no obstructive changes. Some surrounding inflammatory changes noted related to the small bowel inflammatory change. A small amount of free fluid is noted within the pelvis which is new from the prior exam. No free air is seen. Diffuse inflammatory changes of the distal ileum are again identified with multiple diverticuli is some which contain dense material likely related to  ingested content. The overall appearance is stable from the prior exam with the exception of a new air-fluid collection identified in the right mid abdomen. The fluid component  measures approximately 2.7 x 2.4 cm in greatest dimension. It extends to an area of mottled extraluminal air best seen on image number 60 of series 2. A small adjacent air-fluid collection is noted best seen on image number 57 of series 2. These are consistent with small abscesses likely related to a micro perforation. An additional small air-fluid collection is noted in the right mid abdomen on image number 52 of series 2. Stomach is within normal limits. The proximal small bowel is unremarkable. Vascular/Lymphatic: Duplicated IVC is noted. Aortic calcifications are seen. No significant lymphadenopathy is noted. Reproductive: Prostate is unremarkable. Other: Free fluid is noted within the pelvis consistent with an interval perforation. No significant free air is noted. Previously described abscesses are noted. Musculoskeletal: Degenerative changes of lumbar spine are seen. No acute bony abnormality is noted. IMPRESSION: Persistent changes likely related to inflammatory bowel disease within the distal ileum. There has been interval development of multiple air-fluid collections within the right mid and central abdomen. The largest of these measures approximately 2.7 x 2.4 cm as described above. The air-fluid collections appear to inter communicate enter likely related to rupture of a small ileal diverticulum. Mild free fluid in the pelvis is noted. Bilateral pleural effusions with right basilar atelectasis. The remainder of the exam is stable from the prior study. Electronically Signed   By: Inez Catalina M.D.   On: 02/21/2019 16:08      Assessment/Plan:   Principal Problem:   Ileitis, terminal, with abscess ([place]) Active Problems:   Hypokalemia   Diarrhea   Body mass index is 23.68 kg/m.   Terminal ileitis with microabscesses:  Admit to MedSurg unit with telemetry.  Keep n.p.o. for now.  Hydrate with IV fluids.  Treat with empiric IV antibiotics.  Patient has been seen by the surgeon who recommended conservative management for now. Gastroenterologist tomorrow.  Hypokalemia: Replete potassium.  Check magnesium and phosphorus levels.  Bilateral pleural effusions with right basilar atelectasis on CT abdomen pelvis: Asymptomatic.  He is tolerating room air.  Right renal mass: He has a follow-up appointment with urologist.  Other information:   DVT prophylaxis: Lovenox Code Status: Full code Family Communication: Plan discussed with the patient Disposition Plan: Possible discharge to home in 2 to 3 days  consults called: General surgeon Admission status: Inpatient   The medical decision making is of moderate complexity, therefore this is a level 2 visit.  Time spent 50 minutes  [facility]   How to contact the [facility] Attending or Consulting provider [place] or covering provider during after hours [place], for this patient?   1. Check the care team in [facility] and look for a) attending/consulting [facility] provider listed and b) the [facility] team listed 2. Log into [email or web address] and use [place]'s universal password to access. If you do not have the password, please contact the hospital operator. 3. Locate the [facility] provider you are looking for under [facility] and page to a number that you can be directly reached. 4. If you still have difficulty reaching the provider, please page the [facility] (Director on Call) for the Hospitalists listed on amion for assistance.  02/21/2019, 6:37 PM

## 2019-02-21 NOTE — ED Notes (Signed)
Report given to Rebecca RN.

## 2019-02-21 NOTE — ED Provider Notes (Addendum)
[facility] Emergency Department Provider Note ____________________________________________   First MD Initiated Contact with Patient 02/21/19 1158     (approximate)  I have reviewed the triage vital signs and the nursing notes.  Medical screening examination/treatment/procedure(s) were conducted as a shared visit with non-physician practitioner(s) and myself.  I personally evaluated the patient during the encounter.    HISTORY  Chief Complaint Bloated and Abdominal Pain  HPI Todd Briggs is a 73 y.o. male to the emergency department for treatment and evaluation of abdominal pain and bloating.  Significant past medical history includes IBS, diabetes, and peptic ulcer disease.  He patient was recently here and admitted for 2 days for similar symptoms.  He was diagnosed with ileitis.  He was discharged home with Cipro and Flagyl.  Patient states that he has taken medications as prescribed but feels that he may be having some type of allergic reaction to one of them.  He states that his back has broken out and he has had hives.  He has since stopped this medication.  Patient states that his abdominal pain and bloating began to worsen again over the weekend.  He states that he has had loose bowel movement about every 30 minutes for the past 3 days.  He states that this morning he had an explosive amount of air expelled and he felt somewhat better, then shortly after developed the pain and bloating again.  He denies vomiting.  No known fever.  Last colonoscopy was 4 to 5 years ago.  He has not noticed any blood or dark tarry stool         Past Medical History:  Diagnosis Date  . Allergy    Seasonal  . Anemia   . Arthritis   . Basal cell carcinoma    Removed 1980's  . Cataracts, both eyes   . Diabetes mellitus without complication (HCC)    Borderline  . GERD (gastroesophageal reflux disease)   . IBS (irritable bowel syndrome)   . Peptic ulcer 73 years old    . Scarlet fever 73 years old  . Shortness of breath dyspnea    with exertion    Patient Active Problem List   Diagnosis Date Noted  . Ileitis   . Abdominal pain 02/15/2019  . GERD (gastroesophageal reflux disease) 02/15/2019  . Renal mass 02/15/2019  . IBS (irritable bowel syndrome) 03/01/2015  . Peptic ulcer 03/01/2015  . Anemia 03/01/2015  . Basal cell carcinoma 03/01/2015  . Diabetes mellitus without complication ([place]) 02/54/2706  . 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 Allergic rhinitis, seasonal 01/18/2014    Past Surgical History:  Procedure Laterality Date  . CHOLECYSTECTOMY N/A 03/30/2015   Procedure: LAPAROSCOPIC CHOLECYSTECTOMY;  Surgeon: Hubbard Robinson, MD;  Location: [facility] ORS;  Service: General;  Laterality: N/A;  . HERNIA REPAIR Bilateral 73 years old   Inguinal Hernia    Prior to Admission medications   Medication Sig Start Date End Date Taking? Authorizing Provider  ciprofloxacin (CIPRO) 500 MG tablet Take 1 tablet (500 mg total) by mouth 2 (two) times daily for 5 days. 02/16/19 02/21/19  Max Sane, MD  ibuprofen (ADVIL) 200 MG tablet Take 400-600 mg by mouth every 6 (six) hours as needed for fever or mild pain.    [provider]  metroNIDAZOLE (FLAGYL) 500 MG tablet Take 1 tablet (500 mg total) by mouth 3 (three) times daily for 5 days. 02/16/19 02/21/19  Max Sane, MD  Allergies Penicillins  Family History  Problem Relation Age of Onset  . Hypertension Mother   . Diabetes Mother   . Pancreatitis Mother   . Heart disease Mother   . Heart attack Father   . COPD Father   . Heart disease Father     Social History Social History   Tobacco Use  . Smoking status: Former Smoker    Packs/day: 1.00    Types: Cigarettes    Quit date: 02/28/1985    Years since quitting: 34.0  . Smokeless tobacco: Never Used  Substance Use Topics  . Alcohol use: No  . Drug use: No    Review of Systems  Constitutional: No  fever/chills Eyes: No visual changes. ENT: No sore throat. Cardiovascular: Denies chest pain. Respiratory: Denies shortness of breath. Gastrointestinal: Positive for abdominal pain, bloating, diarrhea  genitourinary: Negative for dysuria. Musculoskeletal: Negative for back pain. Skin: Negative for rash. Neurological: Negative for headaches, focal weakness or numbness. ____________________________________________   PHYSICAL EXAM:  VITAL SIGNS: ED Triage Vitals  Enc Vitals Group     BP 02/21/19 0845 (!) 143/78     Pulse Rate 02/21/19 0845 88     Resp 02/21/19 0845 17     Temp 02/21/19 0845 98.1 F (36.7 C)     Temp Source 02/21/19 0845 Oral     SpO2 02/21/19 0845 95 %     Weight 02/21/19 0846 165 lb (74.8 kg)     Height 02/21/19 0846 5' 10"  (1.778 m)     Head Circumference --      Peak Flow --      Pain Score 02/21/19 0854 3     Pain Loc --      Pain Edu? --      Excl. in [place]? --     Constitutional: Alert and oriented. Well appearing and in no acute distress. Eyes: Conjunctivae are normal.  Head: Atraumatic. Nose: No congestion/rhinnorhea. Mouth/Throat: Mucous membranes are moist. Oropharynx non-erythematous. Neck: No stridor.   Hematological/Lymphatic/Immunilogical: No cervical lymphadenopathy. Cardiovascular: Normal rate, regular rhythm. Grossly normal heart sounds.  Good peripheral circulation. Respiratory: Normal respiratory effort.  No retractions. Lungs CTAB. Gastrointestinal: Tense and mildly distended.  Decreased bowel sounds x4 quadrants.  Tender to palpation over the right upper and lower quadrants. Genitourinary:  Musculoskeletal: No lower extremity tenderness nor edema.  No joint effusions. Neurologic:  Normal speech and language. No gross focal neurologic deficits are appreciated. No gait instability. Skin:  Skin is warm, dry and intact. No rash noted. Psychiatric: Mood and affect are normal. Speech and behavior are  normal.  ____________________________________________   LABS (all labs ordered are listed, but only abnormal results are displayed)  Labs Reviewed  COMPREHENSIVE METABOLIC PANEL - Abnormal; Notable for the following components:      Result Value   Potassium 2.8 (*)    Chloride 97 (*)    Glucose, Bld 123 (*)    Calcium 7.5 (*)    Total Protein 5.6 (*)    Albumin 2.4 (*)    All other components within normal limits  CBC - Abnormal; Notable for the following components:   WBC 16.4 (*)    Hemoglobin 12.4 (*)    HCT 36.8 (*)    All other components within normal limits  URINALYSIS, COMPLETE (UACMP) WITH MICROSCOPIC - Abnormal; Notable for the following components:   Color, Urine AMBER (*)    APPearance HAZY (*)    Hgb urine dipstick SMALL (*)    Ketones, ur  20 (*)    Protein, ur 30 (*)    Leukocytes,Ua SMALL (*)    All other components within normal limits  GI PATHOGEN PANEL BY PCR, STOOL  C DIFFICILE QUICK SCREEN W PCR REFLEX  LIPASE, BLOOD   ____________________________________________  EKG   ____________________________________________  RADIOLOGY   Official radiology report(s): DG Abdomen 1 View  Result Date: 02/21/2019 CLINICAL DATA:  Abdominal bloating EXAM: ABDOMEN - 1 VIEW COMPARISON:  None. FINDINGS: Mildly distended loops of small bowel in the left upper quadrant. No significant stool burden. IMPRESSION: Mildly distended small bowel loops in the left upper quadrant. Electronically Signed   By: Macy Mis M.D.   On: 02/21/2019 12:43   CT abd  Result Date: 02/21/2019 CLINICAL DATA:  Abdominal pain and distension EXAM: CT ABDOMEN AND PELVIS WITH CONTRAST TECHNIQUE: Multidetector CT imaging of the abdomen and pelvis was performed using the standard protocol following bolus administration of intravenous contrast. CONTRAST:  122m OMNIPAQUE IOHEXOL 300 MG/ML  SOLN COMPARISON:  02/15/2019 FINDINGS: Lower chest: Small bilateral pleural effusions are noted right  greater than left with associated right basilar atelectatic changes. Scattered calcified granulomas are seen. Hepatobiliary: Tiny hypodensity is noted in the dome of the liver posteriorly best seen on image number 9 of series 2 stable from the prior exam. A few scattered hypodensities are seen stable from the prior study. Status post cholecystectomy. No biliary dilatation. Pancreas: Unremarkable. No pancreatic ductal dilatation or surrounding inflammatory changes. Spleen: Normal in size without focal abnormality. Adrenals/Urinary Tract: Adrenal glands are stable with a small right adrenal nodule identified. Normal enhancement of the kidneys is seen with the exception of a right lower pole mildly enhancing lesion which is stable from the prior exam. No obstructive changes are seen. The bladder is partially distended. Stomach/Bowel: Colon shows no obstructive changes. Some surrounding inflammatory changes noted related to the small bowel inflammatory change. A small amount of free fluid is noted within the pelvis which is new from the prior exam. No free air is seen. Diffuse inflammatory changes of the distal ileum are again identified with multiple diverticuli is some which contain dense material likely related to ingested content. The overall appearance is stable from the prior exam with the exception of a new air-fluid collection identified in the right mid abdomen. The fluid component measures approximately 2.7 x 2.4 cm in greatest dimension. It extends to an area of mottled extraluminal air best seen on image number 60 of series 2. A small adjacent air-fluid collection is noted best seen on image number 57 of series 2. These are consistent with small abscesses likely related to a micro perforation. An additional small air-fluid collection is noted in the right mid abdomen on image number 52 of series 2. Stomach is within normal limits. The proximal small bowel is unremarkable. Vascular/Lymphatic: Duplicated IVC  is noted. Aortic calcifications are seen. No significant lymphadenopathy is noted. Reproductive: Prostate is unremarkable. Other: Free fluid is noted within the pelvis consistent with an interval perforation. No significant free air is noted. Previously described abscesses are noted. Musculoskeletal: Degenerative changes of lumbar spine are seen. No acute bony abnormality is noted. IMPRESSION: Persistent changes likely related to inflammatory bowel disease within the distal ileum. There has been interval development of multiple air-fluid collections within the right mid and central abdomen. The largest of these measures approximately 2.7 x 2.4 cm as described above. The air-fluid collections appear to inter communicate enter likely related to rupture of a small ileal diverticulum. Mild  free fluid in the pelvis is noted. Bilateral pleural effusions with right basilar atelectasis. The remainder of the exam is stable from the prior study. Electronically Signed   By: Inez Catalina M.D.   On: 02/21/2019 16:08    ____________________________________________   PROCEDURES  Procedure(s) performed (including Critical Care):  Procedures  ____________________________________________   INITIAL IMPRESSION / ASSESSMENT AND PLAN     73 year old male presenting to the emergency department for treatment and evaluation of bloating and diarrhea.  See HPI for further details.  While awaiting ER room assignment, labs are drawn.  Potassium is 2.8 white blood cell count is now 16.4 versus 14.6 upon discharge.  6 days ago bilirubin 1 was elevated, however today it is normal at 0.8.  Plan will be to give some saline, potassium supplement, and attempt to get a stool specimen.  While he was admitted here last week, he was unable to provide a specimen for PCR and C. difficile testing.  DIFFERENTIAL DIAGNOSIS  Diverticulitis, IBS, ileitis, appendicitis, Crohn's  ED COURSE  Patient seen with NP Triplett.  Review of old  chart shows that his CT shows extensive ileitis, ileal diverticula.  He also recently had MRI abdomen to evaluate renal mass.  X-ray today shows some dilated small bowel loops, and he still has persistent tenderness worse in the right lower quadrant with guarding.  Given his age, leukocytosis of 16,000, I will need to repeat the CT scan today to ensure he is not developing an intra-abdominal abscess, hemorrhage, or other complication.  ----------------------------------------- 5:30 PM on 02/21/2019 -----------------------------------------  CT shows persistent ileitis with some new small abscess collections concerning for microperforation.  Evaluated by surgery Dr. Hampton Abbot agrees with hospitalization for broad-spectrum IV antibiotics.  I will order IV Zosyn.  Stool studies are sent.  Depending on response to antibiotic therapy, patient may need GI to do colonoscopy versus further surgical evaluation.  ----------------------------------------- 5:51 PM on 02/21/2019 -----------------------------------------  Discussed with hospitalist for evaluation.  Due to penicillin allergy and outpatient failure on cipro, I ordered cefepime and Flagyl for antibiotic coverage ____________________________________________    FINAL CLINICAL IMPRESSION(S) / ED DIAGNOSES  Final diagnoses:  Ileitis, terminal, with abscess ([place])  Right lower quadrant abdominal pain     ED Discharge Orders    None       Note:  This document was prepared using Dragon voice recognition software and may include unintentional dictation errors.   Carrie Mew, MD 02/21/19 1731    Carrie Mew, MD 02/21/19 [phone]

## 2019-02-21 NOTE — ED Notes (Signed)
Pt ambulated to restroom, steady gait, no assistance needed. NAD noted.

## 2019-02-21 NOTE — ED Notes (Signed)
Patient's daughter Phoebe Sharps [phone]) was called and detailed message left on voicemail with patient's consent. Return number given to daughter for her to call with any questions.

## 2019-02-21 NOTE — Progress Notes (Signed)
PHARMACY -  BRIEF ANTIBIOTIC NOTE   Pharmacy has received consult(s) for cefepime from an ED provider.  The patient's profile has been reviewed for ht/wt/allergies/indication/available labs.    One time order(s) placed for cefepime 2 g IV x1 by EDP.  Further antibiotics/pharmacy consults should be ordered by admitting physician if indicated.                       Thank you, Rocky Morel 02/21/2019  6:23 PM

## 2019-02-21 NOTE — ED Notes (Signed)
Patient given ginger ale to drink. Will continue to monitor for N/V.

## 2019-02-21 NOTE — Progress Notes (Signed)
Pharmacy Antibiotic Note  Todd Briggs is a 73 y.o. male admitted on 02/21/2019 with IAI (terminal ileitis with microabscesses).  Pharmacy has been consulted for cefepime dosing. Also on Flagyl.   Of note, pt with PCN allergy (rash). Pt tolerated cefepime dose in ED given at 1837. Per ED RN, no s/sx of allergic reaction so far.   Plan: Cefepime 2 g IV q8h    Height: 5' 10"  (177.8 cm) Weight: 165 lb (74.8 kg) IBW/kg (Calculated) : 73  Temp (24hrs), Avg:98.1 F (36.7 C), Min:98.1 F (36.7 C), Max:98.1 F (36.7 C)  Recent Labs  Lab 02/15/19 0523 02/15/19 0728 02/16/19 0730 02/21/19 0900  WBC 14.0*  --  14.6* 16.4*  CREATININE  --  0.84 1.19 0.98    Estimated Creatinine Clearance: 67.2 mL/min (by C-G formula based on SCr of 0.98 mg/dL).    Allergies  Allergen Reactions  . Penicillins Rash    Occurred as a child    Antimicrobials this admission: Cefepime/Flagyl 12/28 >>  Dose adjustments this admission:   Microbiology results: C diff neg GI panel pending  Thank you for allowing pharmacy to be a part of this patient's care.  Rocky Morel 02/21/2019 8:36 PM

## 2019-02-21 NOTE — Telephone Encounter (Signed)
Patient's wife called to let us know she was taken the patient back to the Emergency Department. He was seen while in the hospital & that doctor was to let DR Allen Norris know he would be following up with him.

## 2019-02-21 NOTE — ED Notes (Signed)
Pt's daughter Todd Briggs called stating that pt was not capable of making decisions for himself and demanding that she be allowed to visit with pt. Upon this RN and provider [facility] assessment, pt appears capable of making decisions for himself and well oriented.  Pt made aware that daughter called, pt visibly shook his head when informed that his daughter was inquiring about him and his condition, this RN made pt aware that his daughter was not listed as pt contact and asked pt if he would like RN to update her. PT stated that he would call his daughter himself and speak with her.

## 2019-02-21 NOTE — ED Triage Notes (Addendum)
Presents with abd pain and abd bloating   Denies any n/v  But has had diarrhea

## 2019-02-21 NOTE — ED Notes (Signed)
This RN attempted to call report. Charge Rn informed this RN that they needed to talk to supervisor about this patient. This RN will call back in 15 to check patient status.

## 2019-02-21 NOTE — ED Notes (Signed)
Patient able to ambulate to restroom with steady gait and no assistance. Stool specimen obtained and sent to lab. Patient given cola upon returning to bed. Call light in reach. No further needs expressed at this time.

## 2019-02-21 NOTE — ED Notes (Signed)
Pt given cup of ice water

## 2019-02-21 NOTE — ED Notes (Signed)
Daughter updated on plan of care. Would like to be kept informed as primary contact person due to patient's wife being hard of hearing and having trouble communicating on phone.

## 2019-02-21 NOTE — ED Provider Notes (Signed)
[facility] Emergency Department Provider Note ____________________________________________   First MD Initiated Contact with Patient 02/21/19 1158     (approximate)  I have reviewed the triage vital signs and the nursing notes.   HISTORY  Chief Complaint Bloated and Abdominal Pain  HPI Todd Briggs is a 73 y.o. male to the emergency department for treatment and evaluation of abdominal pain and bloating.  Significant past medical history includes IBS, diabetes, and peptic ulcer disease.  He patient was recently here and admitted for 2 days for similar symptoms.  He was diagnosed with ileitis.  He was discharged home with Cipro and Flagyl.  Patient states that he has taken medications as prescribed but feels that he may be having some type of allergic reaction to one of them.  He states that his back has broken out and he has had hives.  He has since stopped this medication.  Patient states that his abdominal pain and bloating began to worsen again over the weekend.  He states that he has had loose bowel movement about every 30 minutes for the past 3 days.  He states that this morning he had an explosive amount of air expelled and he felt somewhat better, then shortly after developed the pain and bloating again.  He denies vomiting.  No known fever.  Last colonoscopy was 4 to 5 years ago.  He has not noticed any blood or dark tarry stool         Past Medical History:  Diagnosis Date  . Allergy    Seasonal  . Anemia   . Arthritis   . Basal cell carcinoma    Removed 1980's  . Cataracts, both eyes   . Diabetes mellitus without complication (HCC)    Borderline  . GERD (gastroesophageal reflux disease)   . IBS (irritable bowel syndrome)   . Peptic ulcer 73 years old  . Scarlet fever 73 years old  . Shortness of breath dyspnea    with exertion    Patient Active Problem List   Diagnosis Date Noted  . Ileitis   . Abdominal pain 02/15/2019  . GERD  (gastroesophageal reflux disease) 02/15/2019  . Renal mass 02/15/2019  . IBS (irritable bowel syndrome) 03/01/2015  . Peptic ulcer 03/01/2015  . Anemia 03/01/2015  . Basal cell carcinoma 03/01/2015  . Diabetes mellitus without complication ([place]) 83/38/2505  . 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 Allergic rhinitis, seasonal 01/18/2014    Past Surgical History:  Procedure Laterality Date  . CHOLECYSTECTOMY N/A 03/30/2015   Procedure: LAPAROSCOPIC CHOLECYSTECTOMY;  Surgeon: Hubbard Robinson, MD;  Location: [facility] ORS;  Service: General;  Laterality: N/A;  . HERNIA REPAIR Bilateral 73 years old   Inguinal Hernia    Prior to Admission medications   Medication Sig Start Date End Date Taking? Authorizing Provider  ciprofloxacin (CIPRO) 500 MG tablet Take 1 tablet (500 mg total) by mouth 2 (two) times daily for 5 days. 02/16/19 02/21/19  Max Sane, MD  ibuprofen (ADVIL) 200 MG tablet Take 400-600 mg by mouth every 6 (six) hours as needed for fever or mild pain.    [provider]  metroNIDAZOLE (FLAGYL) 500 MG tablet Take 1 tablet (500 mg total) by mouth 3 (three) times daily for 5 days. 02/16/19 02/21/19  Max Sane, MD    Allergies Penicillins  Family History  Problem Relation Age of Onset  . Hypertension Mother   . Diabetes Mother   . Pancreatitis Mother   .  Heart disease Mother   . Heart attack Father   . COPD Father   . Heart disease Father     Social History Social History   Tobacco Use  . Smoking status: Former Smoker    Packs/day: 1.00    Types: Cigarettes    Quit date: 02/28/1985    Years since quitting: 34.0  . Smokeless tobacco: Never Used  Substance Use Topics  . Alcohol use: No  . Drug use: No    Review of Systems  Constitutional: No fever/chills Eyes: No visual changes. ENT: No sore throat. Cardiovascular: Denies chest pain. Respiratory: Denies shortness of breath. Gastrointestinal: Positive for abdominal pain,  bloating, diarrhea  genitourinary: Negative for dysuria. Musculoskeletal: Negative for back pain. Skin: Negative for rash. Neurological: Negative for headaches, focal weakness or numbness. ____________________________________________   PHYSICAL EXAM:  VITAL SIGNS: ED Triage Vitals  Enc Vitals Group     BP 02/21/19 0845 (!) 143/78     Pulse Rate 02/21/19 0845 88     Resp 02/21/19 0845 17     Temp 02/21/19 0845 98.1 F (36.7 C)     Temp Source 02/21/19 0845 Oral     SpO2 02/21/19 0845 95 %     Weight 02/21/19 0846 165 lb (74.8 kg)     Height 02/21/19 0846 5' 10"  (1.778 m)     Head Circumference --      Peak Flow --      Pain Score 02/21/19 0854 3     Pain Loc --      Pain Edu? --      Excl. in [place]? --     Constitutional: Alert and oriented. Well appearing and in no acute distress. Eyes: Conjunctivae are normal.  Head: Atraumatic. Nose: No congestion/rhinnorhea. Mouth/Throat: Mucous membranes are moist. Oropharynx non-erythematous. Neck: No stridor.   Hematological/Lymphatic/Immunilogical: No cervical lymphadenopathy. Cardiovascular: Normal rate, regular rhythm. Grossly normal heart sounds.  Good peripheral circulation. Respiratory: Normal respiratory effort.  No retractions. Lungs CTAB. Gastrointestinal: Tense and mildly distended.  Decreased bowel sounds x4 quadrants.  Tender to palpation over the right upper and lower quadrants. Genitourinary:  Musculoskeletal: No lower extremity tenderness nor edema.  No joint effusions. Neurologic:  Normal speech and language. No gross focal neurologic deficits are appreciated. No gait instability. Skin:  Skin is warm, dry and intact. No rash noted. Psychiatric: Mood and affect are normal. Speech and behavior are normal.  ____________________________________________   LABS (all labs ordered are listed, but only abnormal results are displayed)  Labs Reviewed  COMPREHENSIVE METABOLIC PANEL - Abnormal; Notable for the following  components:      Result Value   Potassium 2.8 (*)    Chloride 97 (*)    Glucose, Bld 123 (*)    Calcium 7.5 (*)    Total Protein 5.6 (*)    Albumin 2.4 (*)    All other components within normal limits  CBC - Abnormal; Notable for the following components:   WBC 16.4 (*)    Hemoglobin 12.4 (*)    HCT 36.8 (*)    All other components within normal limits  URINALYSIS, COMPLETE (UACMP) WITH MICROSCOPIC - Abnormal; Notable for the following components:   Color, Urine AMBER (*)    APPearance HAZY (*)    Hgb urine dipstick SMALL (*)    Ketones, ur 20 (*)    Protein, ur 30 (*)    Leukocytes,Ua SMALL (*)    All other components within normal limits  GI PATHOGEN PANEL  BY PCR, STOOL  C DIFFICILE QUICK SCREEN W PCR REFLEX  SARS CORONAVIRUS 2 (TAT 6-24 HRS)  LIPASE, BLOOD   ____________________________________________  EKG  Not indicated. ____________________________________________  RADIOLOGY  ED MD interpretation:      Official radiology report(s): DG Abdomen 1 View  Result Date: 02/21/2019 CLINICAL DATA:  Abdominal bloating EXAM: ABDOMEN - 1 VIEW COMPARISON:  None. FINDINGS: Mildly distended loops of small bowel in the left upper quadrant. No significant stool burden. IMPRESSION: Mildly distended small bowel loops in the left upper quadrant. Electronically Signed   By: Macy Mis M.D.   On: 02/21/2019 12:43   CT abd  Result Date: 02/21/2019 CLINICAL DATA:  Abdominal pain and distension EXAM: CT ABDOMEN AND PELVIS WITH CONTRAST TECHNIQUE: Multidetector CT imaging of the abdomen and pelvis was performed using the standard protocol following bolus administration of intravenous contrast. CONTRAST:  16m OMNIPAQUE IOHEXOL 300 MG/ML  SOLN COMPARISON:  02/15/2019 FINDINGS: Lower chest: Small bilateral pleural effusions are noted right greater than left with associated right basilar atelectatic changes. Scattered calcified granulomas are seen. Hepatobiliary: Tiny hypodensity is  noted in the dome of the liver posteriorly best seen on image number 9 of series 2 stable from the prior exam. A few scattered hypodensities are seen stable from the prior study. Status post cholecystectomy. No biliary dilatation. Pancreas: Unremarkable. No pancreatic ductal dilatation or surrounding inflammatory changes. Spleen: Normal in size without focal abnormality. Adrenals/Urinary Tract: Adrenal glands are stable with a small right adrenal nodule identified. Normal enhancement of the kidneys is seen with the exception of a right lower pole mildly enhancing lesion which is stable from the prior exam. No obstructive changes are seen. The bladder is partially distended. Stomach/Bowel: Colon shows no obstructive changes. Some surrounding inflammatory changes noted related to the small bowel inflammatory change. A small amount of free fluid is noted within the pelvis which is new from the prior exam. No free air is seen. Diffuse inflammatory changes of the distal ileum are again identified with multiple diverticuli is some which contain dense material likely related to ingested content. The overall appearance is stable from the prior exam with the exception of a new air-fluid collection identified in the right mid abdomen. The fluid component measures approximately 2.7 x 2.4 cm in greatest dimension. It extends to an area of mottled extraluminal air best seen on image number 60 of series 2. A small adjacent air-fluid collection is noted best seen on image number 57 of series 2. These are consistent with small abscesses likely related to a micro perforation. An additional small air-fluid collection is noted in the right mid abdomen on image number 52 of series 2. Stomach is within normal limits. The proximal small bowel is unremarkable. Vascular/Lymphatic: Duplicated IVC is noted. Aortic calcifications are seen. No significant lymphadenopathy is noted. Reproductive: Prostate is unremarkable. Other: Free fluid is  noted within the pelvis consistent with an interval perforation. No significant free air is noted. Previously described abscesses are noted. Musculoskeletal: Degenerative changes of lumbar spine are seen. No acute bony abnormality is noted. IMPRESSION: Persistent changes likely related to inflammatory bowel disease within the distal ileum. There has been interval development of multiple air-fluid collections within the right mid and central abdomen. The largest of these measures approximately 2.7 x 2.4 cm as described above. The air-fluid collections appear to inter communicate enter likely related to rupture of a small ileal diverticulum. Mild free fluid in the pelvis is noted. Bilateral pleural effusions with right basilar  atelectasis. The remainder of the exam is stable from the prior study. Electronically Signed   By: Inez Catalina M.D.   On: 02/21/2019 16:08    ____________________________________________   PROCEDURES  Procedure(s) performed (including Critical Care):  Procedures  ____________________________________________   INITIAL IMPRESSION / ASSESSMENT AND PLAN     73 year old male presenting to the emergency department for treatment and evaluation of bloating and diarrhea.  See HPI for further details.  While awaiting ER room assignment, labs are drawn.  Potassium is 2.8 white blood cell count is now 16.4 versus 14.6 upon discharge.  6 days ago bilirubin 1 was elevated, however today it is normal at 0.8.  Plan will be to give some saline, potassium supplement, and attempt to get a stool specimen.  While he was admitted here last week, he was unable to provide a specimen for PCR and C. difficile testing.  DIFFERENTIAL DIAGNOSIS  Diverticulitis, IBS, ileitis, appendicitis, Crohn's  ED COURSE  Review of old chart shows that his CT shows concern for Crohn's or other inflammatory bowel issue.  Care transitioned to Dr. Joni Fears for final  disposition. ____________________________________________   FINAL CLINICAL IMPRESSION(S) / ED DIAGNOSES  Final diagnoses:  Ileitis, terminal, with abscess ([place])  Right lower quadrant abdominal pain     ED Discharge Orders    None       Note:  This document was prepared using Dragon voice recognition software and may include unintentional dictation errors.   Victorino Dike, FNP 02/21/19 1735    Carrie Mew, MD 02/21/19 1850

## 2019-02-21 NOTE — Consult Note (Signed)
Date of Consultation:  02/21/2019  Requesting Physician:  Carrie Mew, MD  Reason for Consultation:  Ileitis with abscesses  History of Present Illness: Todd Briggs is a 73 y.o. male presenting for evaluation of abdominal pain and bloatedness.  He was recently admitted on 12/22 with abdominal pain that started the day prior associated with some nausea and vomiting.  He does have a history of chronic diarrhea but reports that the diarrhea was worse at that point.  On admission, he had a CT scan of the abdomen pelvis which showed fairly extensive inflammatory or infectious ileitis as well as a 2 cm lower pole right renal lesion which was followed on his MRI showing a potential solid renal neoplasm such as renal cell carcinoma.  Patient was admitted for IV antibiotics and was discharged to home on 12/23.  He presents today because he has been having persistent bloatedness and discomfort in his lower abdomen as well as persistent diarrhea.  He reports that overall there is no significant worsening but he still has bloatedness and diarrhea.  He did have a potential rash issue with one of the antibiotics that he stopped taking yesterday.  He was given a 5-day course of both ciprofloxacin and Flagyl and only has missed 1 day which would be today.  He reports that after having flatus or bowel movement the bloatedness does improve.  Denies having any fevers, nausea, vomiting.  Today in the emergency room, his work-up shows a white blood cell count of 16.4 which is higher than his discharge at 14.6, a potassium of 2.8.  Stool studies are currently pending.  CT scan repeated today shows again persistent inflammatory changes in the terminal ileum, however there is now also development of small fluid collections measuring as much as 2.7 x 2.4 cm.  There is no free air.  Past Medical History: Past Medical History:  Diagnosis Date  . Allergy    Seasonal  . Anemia   . Arthritis   . Basal cell carcinoma     Removed 1980's  . Cataracts, both eyes   . Diabetes mellitus without complication (HCC)    Borderline  . GERD (gastroesophageal reflux disease)   . IBS (irritable bowel syndrome)   . Peptic ulcer 73 years old  . Scarlet fever 73 years old  . Shortness of breath dyspnea    with exertion     Past Surgical History: Past Surgical History:  Procedure Laterality Date  . CHOLECYSTECTOMY N/A 03/30/2015   Procedure: LAPAROSCOPIC CHOLECYSTECTOMY;  Surgeon: Hubbard Robinson, MD;  Location: [facility] ORS;  Service: General;  Laterality: N/A;  . HERNIA REPAIR Bilateral 73 years old   Inguinal Hernia    Home Medications: Prior to Admission medications   Medication Sig Start Date End Date Taking? Authorizing Provider  cetirizine (ZYRTEC) 10 MG tablet Take 10 mg by mouth daily.   Yes [provider]  ibuprofen (ADVIL) 200 MG tablet Take 400-600 mg by mouth every 6 (six) hours as needed for fever or mild pain.   Yes [provider]    Allergies: Allergies  Allergen Reactions  . Penicillins Rash    Occurred as a child    Social History:  reports that he quit smoking about 34 years ago. His smoking use included cigarettes. He smoked 1.00 pack per day. He has never used smokeless tobacco. He reports that he does not drink alcohol or use drugs.   Family History: Family History  Problem Relation Age of Onset  .  Hypertension Mother   . Diabetes Mother   . Pancreatitis Mother   . Heart disease Mother   . Heart attack Father   . COPD Father   . Heart disease Father     Review of Systems: Review of Systems  Constitutional: Negative for chills and fever.  HENT: Negative for hearing loss.   Respiratory: Negative for shortness of breath.   Cardiovascular: Negative for chest pain.  Gastrointestinal: Positive for abdominal pain and diarrhea. Negative for blood in stool, constipation, nausea and vomiting.  Genitourinary: Negative for dysuria.  Musculoskeletal: Negative for  myalgias.  Skin: Positive for rash.  Neurological: Negative for dizziness.  Psychiatric/Behavioral: Negative for depression.    Physical Exam BP (!) 143/78 (BP Location: Left Arm)   Pulse 88   Temp 98.1 F (36.7 C) (Oral)   Resp 17   Ht 5' 10"  (1.778 m)   Wt 74.8 kg   SpO2 95%   BMI 23.68 kg/m  CONSTITUTIONAL: No acute distress HEENT:  Normocephalic, atraumatic, extraocular motion intact. NECK: Trachea is midline, and there is no jugular venous distension. RESPIRATORY:  Lungs are clear, and breath sounds are equal bilaterally. Normal respiratory effort without pathologic use of accessory muscles. CARDIOVASCULAR: Heart is regular without murmurs, gallops, or rubs. GI: The abdomen is soft, nondistended, with mild tenderness to palpation in the right lower quadrant particularly.  There is currently no peritoneal signs and the patient is not toxic.  He has well-healed scars from his prior laparoscopic cholecystectomy.   MUSCULOSKELETAL:  Normal muscle strength and tone in all four extremities.  No peripheral edema or cyanosis. SKIN: Skin turgor is normal. There are no pathologic skin lesions.  NEUROLOGIC:  Motor and sensation is grossly normal.  Cranial nerves are grossly intact. PSYCH:  Alert and oriented to person, place and time. Affect is normal.  Laboratory Analysis: Results for orders placed or performed during the hospital encounter of 02/21/19 (from the past 24 hour(s))  Lipase, blood     Status: None   Collection Time: 02/21/19  9:00 AM  Result Value Ref Range   Lipase 33 11 - 51 U/L  Comprehensive metabolic panel     Status: Abnormal   Collection Time: 02/21/19  9:00 AM  Result Value Ref Range   Sodium 135 135 - 145 mmol/L   Potassium 2.8 (L) 3.5 - 5.1 mmol/L   Chloride 97 (L) 98 - 111 mmol/L   CO2 27 22 - 32 mmol/L   Glucose, Bld 123 (H) 70 - 99 mg/dL   BUN 13 8 - 23 mg/dL   Creatinine, Ser 0.98 0.61 - 1.24 mg/dL   Calcium 7.5 (L) 8.9 - 10.3 mg/dL   Total Protein  5.6 (L) 6.5 - 8.1 g/dL   Albumin 2.4 (L) 3.5 - 5.0 g/dL   AST 32 15 - 41 U/L   ALT 22 0 - 44 U/L   Alkaline Phosphatase 106 38 - 126 U/L   Total Bilirubin 0.8 0.3 - 1.2 mg/dL   GFR calc non Af Amer >60 >60 mL/min   GFR calc Af Amer >60 >60 mL/min   Anion gap 11 5 - 15  CBC     Status: Abnormal   Collection Time: 02/21/19  9:00 AM  Result Value Ref Range   WBC 16.4 (H) 4.0 - 10.5 K/uL   RBC 4.50 4.22 - 5.81 MIL/uL   Hemoglobin 12.4 (L) 13.0 - 17.0 g/dL   HCT 36.8 (L) 39.0 - 52.0 %   MCV 81.8 80.0 -  100.0 fL   MCH 27.6 26.0 - 34.0 pg   MCHC 33.7 30.0 - 36.0 g/dL   RDW 13.3 11.5 - 15.5 %   Platelets 380 150 - 400 K/uL   nRBC 0.0 0.0 - 0.2 %  Urinalysis, Complete w Microscopic     Status: Abnormal   Collection Time: 02/21/19  9:00 AM  Result Value Ref Range   Color, Urine AMBER (A) YELLOW   APPearance HAZY (A) CLEAR   Specific Gravity, Urine 1.025 1.005 - 1.030   pH 6.0 5.0 - 8.0   Glucose, UA NEGATIVE NEGATIVE mg/dL   Hgb urine dipstick SMALL (A) NEGATIVE   Bilirubin Urine NEGATIVE NEGATIVE   Ketones, ur 20 (A) NEGATIVE mg/dL   Protein, ur 30 (A) NEGATIVE mg/dL   Nitrite NEGATIVE NEGATIVE   Leukocytes,Ua SMALL (A) NEGATIVE   RBC / HPF 21-50 0 - 5 RBC/hpf   WBC, UA 6-10 0 - 5 WBC/hpf   Bacteria, UA NONE SEEN NONE SEEN   Squamous Epithelial / LPF 0-5 0 - 5   Mucus PRESENT     Imaging: DG Abdomen 1 View  Result Date: 02/21/2019 CLINICAL DATA:  Abdominal bloating EXAM: ABDOMEN - 1 VIEW COMPARISON:  None. FINDINGS: Mildly distended loops of small bowel in the left upper quadrant. No significant stool burden. IMPRESSION: Mildly distended small bowel loops in the left upper quadrant. Electronically Signed   By: Macy Mis M.D.   On: 02/21/2019 12:43   CT abd  Result Date: 02/21/2019 CLINICAL DATA:  Abdominal pain and distension EXAM: CT ABDOMEN AND PELVIS WITH CONTRAST TECHNIQUE: Multidetector CT imaging of the abdomen and pelvis was performed using the standard protocol  following bolus administration of intravenous contrast. CONTRAST:  164m OMNIPAQUE IOHEXOL 300 MG/ML  SOLN COMPARISON:  02/15/2019 FINDINGS: Lower chest: Small bilateral pleural effusions are noted right greater than left with associated right basilar atelectatic changes. Scattered calcified granulomas are seen. Hepatobiliary: Tiny hypodensity is noted in the dome of the liver posteriorly best seen on image number 9 of series 2 stable from the prior exam. A few scattered hypodensities are seen stable from the prior study. Status post cholecystectomy. No biliary dilatation. Pancreas: Unremarkable. No pancreatic ductal dilatation or surrounding inflammatory changes. Spleen: Normal in size without focal abnormality. Adrenals/Urinary Tract: Adrenal glands are stable with a small right adrenal nodule identified. Normal enhancement of the kidneys is seen with the exception of a right lower pole mildly enhancing lesion which is stable from the prior exam. No obstructive changes are seen. The bladder is partially distended. Stomach/Bowel: Colon shows no obstructive changes. Some surrounding inflammatory changes noted related to the small bowel inflammatory change. A small amount of free fluid is noted within the pelvis which is new from the prior exam. No free air is seen. Diffuse inflammatory changes of the distal ileum are again identified with multiple diverticuli is some which contain dense material likely related to ingested content. The overall appearance is stable from the prior exam with the exception of a new air-fluid collection identified in the right mid abdomen. The fluid component measures approximately 2.7 x 2.4 cm in greatest dimension. It extends to an area of mottled extraluminal air best seen on image number 60 of series 2. A small adjacent air-fluid collection is noted best seen on image number 57 of series 2. These are consistent with small abscesses likely related to a micro perforation. An additional  small air-fluid collection is noted in the right mid abdomen on image  number 52 of series 2. Stomach is within normal limits. The proximal small bowel is unremarkable. Vascular/Lymphatic: Duplicated IVC is noted. Aortic calcifications are seen. No significant lymphadenopathy is noted. Reproductive: Prostate is unremarkable. Other: Free fluid is noted within the pelvis consistent with an interval perforation. No significant free air is noted. Previously described abscesses are noted. Musculoskeletal: Degenerative changes of lumbar spine are seen. No acute bony abnormality is noted. IMPRESSION: Persistent changes likely related to inflammatory bowel disease within the distal ileum. There has been interval development of multiple air-fluid collections within the right mid and central abdomen. The largest of these measures approximately 2.7 x 2.4 cm as described above. The air-fluid collections appear to inter communicate enter likely related to rupture of a small ileal diverticulum. Mild free fluid in the pelvis is noted. Bilateral pleural effusions with right basilar atelectasis. The remainder of the exam is stable from the prior study. Electronically Signed   By: Inez Catalina M.D.   On: 02/21/2019 16:08    Assessment and Plan: This is a 73 y.o. male with abdominal bloatedness and discomfort associated with terminal ileitis with abscesses.  -At this point the etiology for this is unclear.  There is concern for possible ileal diverticulitis but this did not appear to be the case on his last CT scan given the spread of inflammation in the terminal ileum.  There is concern for possible IBD but currently this has not been evaluated on colonoscopy yet.  Nonetheless, the patient is now on peritoneal, with no free air, and at this point no emergent surgery is needed.  Given the small size of the abscesses, as well as the spread of them, this may not be amenable for percutaneous drainage.  At this point I would say he  has failed outpatient management of his terminal ileitis and recommend admission to hospitalist for IV antibiotics.  If there is no improvement on his condition, he may need a repeat CT scan to evaluate the size or extent of the abscesses and whether they are more amenable for drainage in the future.  Otherwise discussed with the patient that he could potentially need surgery in the form of exploratory laparotomy and resection of the involved area including the right colon given the location of the inflammation.  For now, recommend n.p.o. status, IV fluid hydration given his chronic diarrhea, repletion of his electrolytes, IV antibiotics (has been started on cefepime in the emergency room), and following up the stool studies.  Recommend consulting with GI as well to see if he would warrant colonoscopy during this admission. -Discussed with the patient that if he does require surgery during this admission, I will also discussed with urology to see if his renal mass would warrant excision at the same time or not.  Currently he has an outpatient appointment with Dr. Diamantina Providence with urology on 03/23/2019.  Patient understands this and is in agreement.  Face-to-face time spent with the patient and care providers was 80 minutes, with more than 50% of the time spent counseling, educating, and coordinating care of the patient.     Melvyn Neth, MD [facility]:  [phone]

## 2019-02-22 DIAGNOSIS — K50014 Crohn's disease of small intestine with abscess: Principal | ICD-10-CM

## 2019-02-22 LAB — BASIC METABOLIC PANEL
Anion gap: 11 (ref 5–15)
BUN: 10 mg/dL (ref 8–23)
CO2: 28 mmol/L (ref 22–32)
Calcium: 7.1 mg/dL — ABNORMAL LOW (ref 8.9–10.3)
Chloride: 100 mmol/L (ref 98–111)
Creatinine, Ser: 0.99 mg/dL (ref 0.61–1.24)
GFR calc Af Amer: >60 mL/min (ref >60)
GFR calc non Af Amer: >60 mL/min (ref >60)
Glucose, Bld: 97 mg/dL (ref 70–99)
Potassium: 3 mmol/L — ABNORMAL LOW (ref 3.5–5.1)
Sodium: 139 mmol/L (ref 135–145)

## 2019-02-22 LAB — SARS CORONAVIRUS 2 (TAT 6-24 HRS): SARS Coronavirus 2: NEGATIVE

## 2019-02-22 LAB — CBC
HCT: 34.2 % — ABNORMAL LOW (ref 39.0–52.0)
Hemoglobin: 11.8 g/dL — ABNORMAL LOW (ref 13.0–17.0)
MCH: 27.4 pg (ref 26.0–34.0)
MCHC: 34.5 g/dL (ref 30.0–36.0)
MCV: 79.5 fL — ABNORMAL LOW (ref 80.0–100.0)
Platelets: 384 10*3/uL (ref 150–400)
RBC: 4.3 MIL/uL (ref 4.22–5.81)
RDW: 13.7 % (ref 11.5–15.5)
WBC: 12.9 10*3/uL — ABNORMAL HIGH (ref 4.0–10.5)
nRBC: 0 % (ref 0.0–0.2)

## 2019-02-22 LAB — MAGNESIUM: Magnesium: 2 mg/dL (ref 1.7–2.4)

## 2019-02-22 MED ORDER — HYDROCORTISONE 1 % EX CREA
TOPICAL_CREAM | CUTANEOUS | Status: DC | PRN
  Filled 2019-02-22 (×2): qty 28

## 2019-02-22 MED ORDER — INFLUENZA VAC A&B SA ADJ QUAD 0.5 ML IM PRSY
0.5000 mL | PREFILLED_SYRINGE | INTRAMUSCULAR | Status: DC
  Filled 2019-02-22: qty 0.5

## 2019-02-22 MED ORDER — TRAZODONE HCL 50 MG PO TABS
50.0000 mg | ORAL_TABLET | Freq: Every evening | ORAL | Status: DC | PRN
  Administered 2019-02-23 – 2019-02-24 (×2): 50 mg via ORAL
  Filled 2019-02-22 (×2): qty 1

## 2019-02-22 NOTE — Progress Notes (Signed)
Per MD okay for RN to DC telemetry order.

## 2019-02-22 NOTE — Progress Notes (Addendum)
Progress Note    Todd Briggs  MOL:[id number] DOB: [birth year]  DOA: 02/21/2019 PCP: Sofie Hartigan, MD      Brief Narrative:    Medical records reviewed and are as summarized below:   Todd Briggs is an 73 y.o. 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  At that time, he was discharged on ciprofloxacin and Flagyl.  He presented to the hospital today because of worsening diarrhea, generalized weakness and abdominal pain.  He describes the pain as "gas pain".  It was quite severe.  It was nonradiating and there was no known relieving or aggravating factors.  Pain was mainly located in the mid abdomen and right lower quadrant area.  He said he had loose watery stools almost every 30 minutes the day before admission.  His symptoms were associated with abdominal bloating. Stools are nonbloody.  He did not have any fever, chills, nausea, vomiting.  ED Course:  The patient had a CT scan of the abdomen and pelvis which showed persistent changes likely related to inflammatory bowel disease within the distal ileum with interval development of multiple air-fluid collections/abscesses within the right mid and central abdomen.  He was given IV antibiotics, oral and IV potassium and IV fluids in the ED.  He was seen in consultation by the general surgeon who recommended conservative management.    Assessment/Plan:   Principal Problem:   Ileitis, terminal, with abscess ([place]) Active Problems:   Hypokalemia   Diarrhea   Rupture of bowel (HCC)   Body mass index is 23.68 kg/m.    Terminal ileitis with microabscesses: Keep n.p.o. for now.  Continue IV fluids and empiric IV antibiotics.  Consulted gastroenterologist, Dr. Bonna Gains.  Follow-up with general surgeon.   Analgesics as needed for pain.  Hypokalemia: Slightly improved but still low.  Replete potassium.    Bilateral pleural effusions with right basilar atelectasis on CT abdomen pelvis: Asymptomatic.  He is tolerating room air.  Right renal mass: He has a follow-up appointment with urologist.  History of IBS/chronic diarrhea   Family Communication/Anticipated D/C date and plan/Code Status   DVT prophylaxis: Lovenox Code Status: Full code Family Communication: Discussed with the patient  disposition Plan: To be determined     Subjective:   He complains of diarrhea.  He said he had watery nonbloody stools almost every hour last night.  He does not have much abdominal pain.  No vomiting, fever or chills.  Objective:    Vitals:   02/21/19 0846 02/21/19 0854 02/21/19 2148 02/22/19 0628  BP:   (!) 163/86 (!) 142/83  Pulse:   79 76  Resp:    18  Temp:   98.8 F (37.1 C) 98.4 F (36.9 C)  TempSrc:   Oral Oral  SpO2:   96% 95%  Weight: 74.8 kg 74.8 kg    Height: 5' 10"  (1.778 m) 5' 10"  (1.778 m)      Intake/Output Summary (Last 24 hours) at 02/22/2019 1148 Last data filed at 02/22/2019 0935 Gross per 24 hour  Intake 1600 ml  Output 400 ml  Net 1200 ml   Filed Weights   02/21/19 0846 02/21/19 0854  Weight: 74.8 kg 74.8 kg    Exam:  GEN: NAD SKIN: Diffuse erythematous maculopapular rash on his back EYES: No pallor or icterus  ENT:  MMM CV: RRR PULM: CTA B ABD: soft, ND, NT, +BS CNS: AAO x 3, non focal EXT: No edema or tenderness   Data Reviewed:   I have personally reviewed following labs and imaging studies:  Labs: Labs show the following:   Basic Metabolic Panel: Recent Labs  Lab 02/16/19 0730 02/16/19 0933 02/21/19 0900 02/22/19 0539  NA 137  --  135 139  K 3.2*  --  2.8* 3.0*  CL 103  --  97* 100  CO2 23  --  27 28  GLUCOSE 98  --  123* 97  BUN 28*  --  13 10  CREATININE 1.19  --  0.98 0.99  CALCIUM 7.1*  --  7.5* 7.1*  MG  --  1.6* 1.9   --   PHOS  --   --  3.2  --    GFR Estimated Creatinine Clearance: 66.6 mL/min (by C-G formula based on SCr of 0.99 mg/dL). Liver Function Tests: Recent Labs  Lab 02/21/19 0900  AST 32  ALT 22  ALKPHOS 106  BILITOT 0.8  PROT 5.6*  ALBUMIN 2.4*   Recent Labs  Lab 02/21/19 0900  LIPASE 33   No results for input(s): AMMONIA in the last 168 hours. Coagulation profile No results for input(s): INR, PROTIME in the last 168 hours.  CBC: Recent Labs  Lab 02/16/19 0730 02/21/19 0900 02/22/19 0539  WBC 14.6* 16.4* 12.9*  HGB 12.1* 12.4* 11.8*  HCT 36.4* 36.8* 34.2*  MCV 85.8 81.8 79.5*  PLT 193 380 384   Cardiac Enzymes: No results for input(s): CKTOTAL, CKMB, CKMBINDEX, TROPONINI in the last 168 hours. BNP (last 3 results) No results for input(s): PROBNP in the last 8760 hours. CBG: No results for input(s): GLUCAP in the last 168 hours. D-Dimer: No results for input(s): DDIMER in the last 72 hours. Hgb A1c: No results for input(s): HGBA1C in the last 72 hours. Lipid Profile: No results for input(s): CHOL, HDL, LDLCALC, TRIG, CHOLHDL, LDLDIRECT in the last 72 hours. Thyroid function studies: No results for input(s): TSH, T4TOTAL, T3FREE, THYROIDAB in the last 72 hours.  Invalid input(s): FREET3 Anemia work up: No results for input(s): VITAMINB12, FOLATE, FERRITIN, TIBC, IRON, RETICCTPCT in the last 72 hours. Sepsis Labs: Recent Labs  Lab 02/16/19 0730 02/21/19 0900 02/22/19 0539  WBC 14.6* 16.4* 12.9*    Microbiology Recent Results (from the past 240 hour(s))  SARS CORONAVIRUS 2 (TAT 6-24 HRS) Nasopharyngeal Nasopharyngeal Swab     Status: None   Collection Time: 02/15/19 12:10 PM   Specimen: Nasopharyngeal Swab  Result Value Ref Range Status   SARS Coronavirus 2 NEGATIVE NEGATIVE Final    Comment: (NOTE) SARS-CoV-2 target nucleic acids are NOT DETECTED. The SARS-CoV-2 RNA is generally detectable in upper and lower respiratory specimens during the acute  phase of infection. Negative results do not preclude SARS-CoV-2 infection, do not rule out co-infections with other pathogens, and should not be used as the sole basis for treatment or other patient management decisions. Negative results must be combined with clinical observations, patient history, and epidemiological information. The expected result is Negative. Fact Sheet for Patients: SugarRoll.be Fact Sheet for Healthcare Providers: https://www.woods-mathews.com/ This test is not yet approved or cleared by the [place] FDA and  has been authorized for detection and/or diagnosis of SARS-CoV-2 by FDA under an Emergency Use Authorization (EUA). This EUA will remain  in effect (meaning this test can be used) for the duration of the COVID-19 declaration under Section 56  4(b)(1) of the Act, 21 U.S.C. section 360bbb-3(b)(1), unless the authorization is terminated or revoked sooner. Performed at [facility], [place] [address] [place], [place] [address]   CULTURE, BLOOD (ROUTINE X 2) w Reflex to ID Panel     Status: None   Collection Time: 02/15/19  1:16 PM   Specimen: BLOOD  Result Value Ref Range Status   Specimen Description BLOOD LEFT ANTECUBITAL  Final   Special Requests   Final    BOTTLES DRAWN AEROBIC AND ANAEROBIC Blood Culture adequate volume   Culture   Final    NO GROWTH 5 DAYS Performed at [facility], [address] [place], [place] [address]    Report Status 02/20/2019 FINAL  Final  CULTURE, BLOOD (ROUTINE X 2) w Reflex to ID Panel     Status: None   Collection Time: 02/15/19  1:16 PM   Specimen: BLOOD  Result Value Ref Range Status   Specimen Description BLOOD RIGHT ANTECUBITAL  Final   Special Requests   Final    BOTTLES DRAWN AEROBIC AND ANAEROBIC Blood Culture adequate volume   Culture   Final    NO GROWTH 5 DAYS Performed at [facility], [address], [place], [place] [address]     Report Status 02/20/2019 FINAL  Final  C Difficile Quick Screen w PCR reflex     Status: None   Collection Time: 02/21/19  4:48 PM   Specimen: Stool  Result Value Ref Range Status   C Diff antigen NEGATIVE NEGATIVE Final   C Diff toxin NEGATIVE NEGATIVE Final   C Diff interpretation No C. difficile detected.  Final    Comment: Performed at [facility], [place], [place] [address]  SARS CORONAVIRUS 2 (TAT 6-24 HRS) Nasopharyngeal Nasopharyngeal Swab     Status: None   Collection Time: 02/21/19  6:15 PM   Specimen: Nasopharyngeal Swab  Result Value Ref Range Status   SARS Coronavirus 2 NEGATIVE NEGATIVE Final    Comment: (NOTE) SARS-CoV-2 target nucleic acids are NOT DETECTED. The SARS-CoV-2 RNA is generally detectable in upper and lower respiratory specimens during the acute phase of infection. Negative results do not preclude SARS-CoV-2 infection, do not rule out co-infections with other pathogens, and should not be used as the sole basis for treatment or other patient management decisions. Negative results must be combined with clinical observations, patient history, and epidemiological information. The expected result is Negative. Fact Sheet for Patients: SugarRoll.be Fact Sheet for Healthcare Providers: https://www.woods-mathews.com/ This test is not yet approved or cleared by the [place] FDA and  has been authorized for detection and/or diagnosis of SARS-CoV-2 by FDA under an Emergency Use Authorization (EUA). This EUA will remain  in effect (meaning this test can be used) for the duration of the COVID-19 declaration under Section 56 4(b)(1) of the Act, 21 U.S.C. section 360bbb-3(b)(1), unless the authorization is terminated or revoked sooner. Performed at [facility], [place] [address], [place], [place] [address]     Procedures and diagnostic studies:  DG Abdomen 1 View  Result Date:  02/21/2019 CLINICAL DATA:  Abdominal bloating EXAM: ABDOMEN - 1 VIEW COMPARISON:  None. FINDINGS: Mildly distended loops of small bowel in the left upper quadrant. No significant stool burden. IMPRESSION: Mildly distended small bowel loops in the left upper quadrant. Electronically Signed   By: Macy Mis M.D.   On: 02/21/2019 12:43   CT abd  Result Date: 02/21/2019 CLINICAL DATA:  Abdominal pain and distension EXAM: CT ABDOMEN AND PELVIS WITH CONTRAST TECHNIQUE: Multidetector CT imaging of the abdomen and pelvis was performed using the standard protocol following bolus administration of intravenous contrast. CONTRAST:  194m OMNIPAQUE IOHEXOL 300 MG/ML  SOLN COMPARISON:  02/15/2019 FINDINGS: Lower chest: Small bilateral pleural effusions are noted right greater than left with associated right basilar atelectatic changes. Scattered calcified granulomas are seen. Hepatobiliary: Tiny hypodensity is noted in the dome of the liver posteriorly best seen on image number 9 of series 2 stable from the prior exam. A few scattered hypodensities are seen stable from the prior study. Status post cholecystectomy. No biliary dilatation. Pancreas: Unremarkable. No pancreatic ductal dilatation or surrounding inflammatory changes. Spleen: Normal in size without focal abnormality. Adrenals/Urinary Tract: Adrenal glands are stable with a small right adrenal nodule identified. Normal enhancement of the kidneys is seen with the exception of a right lower pole mildly enhancing lesion which is stable from the prior exam. No obstructive changes are seen. The bladder is partially distended. Stomach/Bowel: Colon shows no obstructive changes. Some surrounding inflammatory changes noted related to the small bowel inflammatory change. A small amount of free fluid is noted within the pelvis which is new from the prior exam. No free air is seen. Diffuse inflammatory changes of the distal ileum are again identified with multiple  diverticuli is some which contain dense material likely related to ingested content. The overall appearance is stable from the prior exam with the exception of a new air-fluid collection identified in the right mid abdomen. The fluid component measures approximately 2.7 x 2.4 cm in greatest dimension. It extends to an area of mottled extraluminal air best seen on image number 60 of series 2. A small adjacent air-fluid collection is noted best seen on image number 57 of series 2. These are consistent with small abscesses likely related to a micro perforation. An additional small air-fluid collection is noted in the right mid abdomen on image number 52 of series 2. Stomach is within normal limits. The proximal small bowel is unremarkable. Vascular/Lymphatic: Duplicated IVC is noted. Aortic calcifications are seen. No significant lymphadenopathy is noted. Reproductive: Prostate is unremarkable. Other: Free fluid is noted within the pelvis consistent with an interval perforation. No significant free air is noted. Previously described abscesses are noted. Musculoskeletal: Degenerative changes of lumbar spine are seen. No acute bony abnormality is noted. IMPRESSION: Persistent changes likely related to inflammatory bowel disease within the distal ileum. There has been interval development of multiple air-fluid collections within the right mid and central abdomen. The largest of these measures approximately 2.7 x 2.4 cm as described above. The air-fluid collections appear to inter communicate enter likely related to rupture of a small ileal diverticulum. Mild free fluid in the pelvis is noted. Bilateral pleural effusions with right basilar atelectasis. The remainder of the exam is stable from the prior study. Electronically Signed   By: MInez CatalinaM.D.   On: 02/21/2019 16:08    Medications:   . enoxaparin (LOVENOX) injection  40 mg Subcutaneous Q24H  . [START ON 02/23/2019] influenza vaccine adjuvanted  0.5 mL  Intramuscular Tomorrow-1000  . loratadine  10 mg Oral Daily   Continuous Infusions: . ceFEPime (MAXIPIME) IV 2 g (02/22/19 0934)  . dextrose 5 % and 0.9 % NaCl with KCl 40 mEq/L 75 mL/hr at 02/21/19 2216  . metronidazole 500 mg (02/22/19 1109)     LOS: 1 day   Nahom Carfagno  [facility]   *Please refer  to [email or web address], password [email or web address] to get updated schedule on who will round on this patient, as hospitalists switch teams weekly. If 7PM-7AM, please contact night-coverage at [email or web address], password [email or web address] for any overnight needs.  02/22/2019, 11:48 AM

## 2019-02-22 NOTE — Consult Note (Signed)
Todd Antigua, MD [address] [place], [place], [place], [address] [address], [place], [place], [place], [address] Phone: [phone]  Fax: [phone]  Consultation  Referring Provider:     Dr. Mal Misty Primary Care Physician:  Sofie Hartigan, MD Reason for Consultation:     Diarrhea, abnormal CT  Date of Admission:  02/21/2019 Date of Consultation:  02/22/2019         HPI:   Todd Briggs is a 73 y.o. male who was recently hospitalized for abdominal pain and treated with antibiotics and discharged.  Now presents with diarrhea.  Reports 10 bowel movements yesterday that were loose.  No blood in his stool.  Does report chronic history of loose bowel movements for 30 years.  Baseline is 3-4 loose bowel movements a day.  No prior history of IBD.  No family history of colon cancer.  Prior to his admission on 02/15/2019, patient reported light lower quadrant abdominal pain for 3 to 4 days without fever or chills.  CT on that admission reported extensive inflammatory or infectious ileitis.  Right renal lesion worrisome for solid revealed no neoplasm.  Scattered ileal diverticuli with no diverticulitis.  Had a colonoscopy with Dr. Vira Agar in 2010 for chronic diarrhea.  Please see procedure report in probation.  He describes an area of mild fullness And firmness "in left upper perianal area that is different from on the right.  Somewhat tender to palpation."  Otherwise colon is described to be normal and biopsies were taken for histology.  Internal hemorrhoids reported.  Recommendation was to see Dr. Tamala Julian for evaluation of perianal area.  Past Medical History:  Diagnosis Date  . Allergy    Seasonal  . Anemia   . Arthritis   . Basal cell carcinoma    Removed 1980's  . Cataracts, both eyes   . Diabetes mellitus without complication (HCC)    Borderline  . GERD (gastroesophageal reflux disease)   . IBS (irritable bowel syndrome)   . Peptic ulcer 73 years old  . Scarlet  fever 73 years old  . Shortness of breath dyspnea    with exertion    Past Surgical History:  Procedure Laterality Date  . CHOLECYSTECTOMY N/A 03/30/2015   Procedure: LAPAROSCOPIC CHOLECYSTECTOMY;  Surgeon: Hubbard Robinson, MD;  Location: [facility] ORS;  Service: General;  Laterality: N/A;  . HERNIA REPAIR Bilateral 73 years old   Inguinal Hernia    Prior to Admission medications   Medication Sig Start Date End Date Taking? Authorizing Provider  cetirizine (ZYRTEC) 10 MG tablet Take 10 mg by mouth daily.   Yes [provider]  ibuprofen (ADVIL) 200 MG tablet Take 400-600 mg by mouth every 6 (six) hours as needed for fever or mild pain.   Yes [provider]    Family History  Problem Relation Age of Onset  . Hypertension Mother   . Diabetes Mother   . Pancreatitis Mother   . Heart disease Mother   . Heart attack Father   . COPD Father   . Heart disease Father      Social History   Tobacco Use  . Smoking status: Former Smoker    Packs/day: 1.00    Types: Cigarettes    Quit date: 02/28/1985    Years since quitting: 34.0  . Smokeless tobacco: Never Used  Substance Use Topics  . Alcohol use: No  . Drug use: No    Allergies as of 02/21/2019 - Review Complete 02/21/2019  Allergen Reaction Noted  . Penicillins Rash 03/01/2015    Review of Systems:    All systems reviewed and negative except where noted in HPI.   Physical Exam:  Vital signs in last 24 hours: Vitals:   02/21/19 0854 02/21/19 2148 02/22/19 0628 02/22/19 1339  BP:  (!) 163/86 (!) 142/83 (!) 148/84  Pulse:  79 76 74  Resp:   18 15  Temp:  98.8 F (37.1 C) 98.4 F (36.9 C) (!) 97.4 F (36.3 C)  TempSrc:  Oral Oral Oral  SpO2:  96% 95% 97%  Weight: 74.8 kg     Height: 5' 10"  (1.778 m)      Last BM Date: 02/21/19 General:   Pleasant, cooperative in NAD Head:  Normocephalic and atraumatic. Eyes:   No icterus.   Conjunctiva pink. PERRLA. Ears:  Normal auditory acuity. Neck:  Supple;  no masses or thyroidomegaly Lungs: Respirations even and unlabored. Lungs clear to auscultation bilaterally.   No wheezes, crackles, or rhonchi.  Abdomen:  Soft, nondistended, nontender. Normal bowel sounds. No appreciable masses or hepatomegaly.  No rebound or guarding.  Neurologic:  Alert and oriented x3;  grossly normal neurologically. Skin:  Intact without significant lesions or rashes. Cervical Nodes:  No significant cervical adenopathy. Psych:  Alert and cooperative. Normal affect.  LAB RESULTS: Recent Labs    02/21/19 0900 02/22/19 0539  WBC 16.4* 12.9*  HGB 12.4* 11.8*  HCT 36.8* 34.2*  PLT 380 384   BMET Recent Labs    02/21/19 0900 02/22/19 0539  NA 135 139  K 2.8* 3.0*  CL 97* 100  CO2 27 28  GLUCOSE 123* 97  BUN 13 10  CREATININE 0.98 0.99  CALCIUM 7.5* 7.1*   LFT Recent Labs    02/21/19 0900  PROT 5.6*  ALBUMIN 2.4*  AST 32  ALT 22  ALKPHOS 106  BILITOT 0.8   PT/INR No results for input(s): LABPROT, INR in the last 72 hours.  STUDIES: DG Abdomen 1 View  Result Date: 02/21/2019 CLINICAL DATA:  Abdominal bloating EXAM: ABDOMEN - 1 VIEW COMPARISON:  None. FINDINGS: Mildly distended loops of small bowel in the left upper quadrant. No significant stool burden. IMPRESSION: Mildly distended small bowel loops in the left upper quadrant. Electronically Signed   By: Macy Mis M.D.   On: 02/21/2019 12:43   CT abd  Result Date: 02/21/2019 CLINICAL DATA:  Abdominal pain and distension EXAM: CT ABDOMEN AND PELVIS WITH CONTRAST TECHNIQUE: Multidetector CT imaging of the abdomen and pelvis was performed using the standard protocol following bolus administration of intravenous contrast. CONTRAST:  130m OMNIPAQUE IOHEXOL 300 MG/ML  SOLN COMPARISON:  02/15/2019 FINDINGS: Lower chest: Small bilateral pleural effusions are noted right greater than left with associated right basilar atelectatic changes. Scattered calcified granulomas are seen. Hepatobiliary: Tiny  hypodensity is noted in the dome of the liver posteriorly best seen on image number 9 of series 2 stable from the prior exam. A few scattered hypodensities are seen stable from the prior study. Status post cholecystectomy. No biliary dilatation. Pancreas: Unremarkable. No pancreatic ductal dilatation or surrounding inflammatory changes. Spleen: Normal in size without focal abnormality. Adrenals/Urinary Tract: Adrenal glands are stable with a small right adrenal nodule identified. Normal enhancement of the kidneys is seen with the exception of a right lower pole mildly enhancing lesion which is stable from the prior exam. No obstructive changes are seen. The bladder is partially distended. Stomach/Bowel: Colon shows no obstructive changes. Some surrounding inflammatory changes  noted related to the small bowel inflammatory change. A small amount of free fluid is noted within the pelvis which is new from the prior exam. No free air is seen. Diffuse inflammatory changes of the distal ileum are again identified with multiple diverticuli is some which contain dense material likely related to ingested content. The overall appearance is stable from the prior exam with the exception of a new air-fluid collection identified in the right mid abdomen. The fluid component measures approximately 2.7 x 2.4 cm in greatest dimension. It extends to an area of mottled extraluminal air best seen on image number 60 of series 2. A small adjacent air-fluid collection is noted best seen on image number 57 of series 2. These are consistent with small abscesses likely related to a micro perforation. An additional small air-fluid collection is noted in the right mid abdomen on image number 52 of series 2. Stomach is within normal limits. The proximal small bowel is unremarkable. Vascular/Lymphatic: Duplicated IVC is noted. Aortic calcifications are seen. No significant lymphadenopathy is noted. Reproductive: Prostate is unremarkable. Other:  Free fluid is noted within the pelvis consistent with an interval perforation. No significant free air is noted. Previously described abscesses are noted. Musculoskeletal: Degenerative changes of lumbar spine are seen. No acute bony abnormality is noted. IMPRESSION: Persistent changes likely related to inflammatory bowel disease within the distal ileum. There has been interval development of multiple air-fluid collections within the right mid and central abdomen. The largest of these measures approximately 2.7 x 2.4 cm as described above. The air-fluid collections appear to inter communicate enter likely related to rupture of a small ileal diverticulum. Mild free fluid in the pelvis is noted. Bilateral pleural effusions with right basilar atelectasis. The remainder of the exam is stable from the prior study. Electronically Signed   By: Inez Catalina M.D.   On: 02/21/2019 16:08      Impression / Plan:   Todd Briggs is a 73 y.o. y/o male with diarrhea and abdominal pain for 1 to 2 weeks  Patient CT scan has shown changes compared to the one on last admission recently.  Multiple air-fluid collections reported which appear to "intercommunicating, likely related to rupture of a small ileal diverticulum."  C. difficile is negative and GI panel is pending  Given the acutely abnormal CT scan with reports of "abscesses likely related to a microperforation", colonoscopy would be high risk.  Air insufflation during the colonoscopy can worsen any underlying perforation in the setting of active infection/abscesses the procedure would be considered high risk  Surgery is on board and is evaluating with radiology as far as drain placement patient would likely benefit from a colonoscopy in the near future once this is addressed to evaluate for IBD  Continue gram-negative and anaerobic antibiotics Follow-up pending GI panel  Further work-up for renal lesion as per primary team  Thank you for involving me in  the care of this patient.      LOS: 1 day   Virgel Manifold, MD  02/22/2019, 5:24 PM

## 2019-02-22 NOTE — Progress Notes (Signed)
Per Dr. Hampton Abbot pt is allow to have ice chips and sips pf water for today, but he will be NPO after midnight. Plan is for pt to get a percutaneous drain tomorrow. RN will continue to assess and monitor pt.

## 2019-02-22 NOTE — Progress Notes (Signed)
02/22/2019  Subjective: No acute events overnight.  Patient reports this afternoon that he is feeling better compared to yesterday and the pain has improved significantly.  Reports that his diarrhea has also improved and has not had any problems since at least 4 hours.  Stool testing for C. difficile was negative and currently GI panel is still pending.  Vital signs: Temp:  [97.4 F (36.3 C)-98.8 F (37.1 C)] 97.4 F (36.3 C) (12/29 1339) Pulse Rate:  [74-79] 74 (12/29 1339) Resp:  [15-18] 15 (12/29 1339) BP: (142-163)/(83-86) 148/84 (12/29 1339) SpO2:  [95 %-97 %] 97 % (12/29 1339)   Intake/Output: 12/28 0701 - 12/29 0700 In: 1600 [IV Piggyback:1600] Out: 100 [Urine:100] Last BM Date: 02/21/19  Physical Exam: Constitutional: No acute distress Abdomen: Soft, nondistended, with some mild soreness to palpation in the right lower quadrant and lower abdomen which is significantly improved compared to yesterday.  Labs:  Recent Labs    02/21/19 0900 02/22/19 0539  WBC 16.4* 12.9*  HGB 12.4* 11.8*  HCT 36.8* 34.2*  PLT 380 384   Recent Labs    02/21/19 0900 02/22/19 0539  NA 135 139  K 2.8* 3.0*  CL 97* 100  CO2 27 28  GLUCOSE 123* 97  BUN 13 10  CREATININE 0.98 0.99  CALCIUM 7.5* 7.1*   No results for input(s): LABPROT, INR in the last 72 hours.  Imaging: CT abd  Result Date: 02/21/2019 CLINICAL DATA:  Abdominal pain and distension EXAM: CT ABDOMEN AND PELVIS WITH CONTRAST TECHNIQUE: Multidetector CT imaging of the abdomen and pelvis was performed using the standard protocol following bolus administration of intravenous contrast. CONTRAST:  133m OMNIPAQUE IOHEXOL 300 MG/ML  SOLN COMPARISON:  02/15/2019 FINDINGS: Lower chest: Small bilateral pleural effusions are noted right greater than left with associated right basilar atelectatic changes. Scattered calcified granulomas are seen. Hepatobiliary: Tiny hypodensity is noted in the dome of the liver posteriorly best seen  on image number 9 of series 2 stable from the prior exam. A few scattered hypodensities are seen stable from the prior study. Status post cholecystectomy. No biliary dilatation. Pancreas: Unremarkable. No pancreatic ductal dilatation or surrounding inflammatory changes. Spleen: Normal in size without focal abnormality. Adrenals/Urinary Tract: Adrenal glands are stable with a small right adrenal nodule identified. Normal enhancement of the kidneys is seen with the exception of a right lower pole mildly enhancing lesion which is stable from the prior exam. No obstructive changes are seen. The bladder is partially distended. Stomach/Bowel: Colon shows no obstructive changes. Some surrounding inflammatory changes noted related to the small bowel inflammatory change. A small amount of free fluid is noted within the pelvis which is new from the prior exam. No free air is seen. Diffuse inflammatory changes of the distal ileum are again identified with multiple diverticuli is some which contain dense material likely related to ingested content. The overall appearance is stable from the prior exam with the exception of a new air-fluid collection identified in the right mid abdomen. The fluid component measures approximately 2.7 x 2.4 cm in greatest dimension. It extends to an area of mottled extraluminal air best seen on image number 60 of series 2. A small adjacent air-fluid collection is noted best seen on image number 57 of series 2. These are consistent with small abscesses likely related to a micro perforation. An additional small air-fluid collection is noted in the right mid abdomen on image number 52 of series 2. Stomach is within normal limits. The proximal small  bowel is unremarkable. Vascular/Lymphatic: Duplicated IVC is noted. Aortic calcifications are seen. No significant lymphadenopathy is noted. Reproductive: Prostate is unremarkable. Other: Free fluid is noted within the pelvis consistent with an interval  perforation. No significant free air is noted. Previously described abscesses are noted. Musculoskeletal: Degenerative changes of lumbar spine are seen. No acute bony abnormality is noted. IMPRESSION: Persistent changes likely related to inflammatory bowel disease within the distal ileum. There has been interval development of multiple air-fluid collections within the right mid and central abdomen. The largest of these measures approximately 2.7 x 2.4 cm as described above. The air-fluid collections appear to inter communicate enter likely related to rupture of a small ileal diverticulum. Mild free fluid in the pelvis is noted. Bilateral pleural effusions with right basilar atelectasis. The remainder of the exam is stable from the prior study. Electronically Signed   By: Inez Catalina M.D.   On: 02/21/2019 16:08    Assessment/Plan: This is a 73 y.o. male with distal ileitis with some multiple air-fluid collections consistent with abscesses in the vicinity.  -Discussed with the patient that at this point it is unclear whether this could be abscesses from inflammatory bowel disease versus abscesses from perforated diverticulitis of the distal ileum.  Given that the extent of the ileum that is involved in the inflammation is the same as the previous CT scan of last week, I would be leaning more towards IBD type of issue.  I would imagine that if this were truly a perforated ileal diverticulitis, the area of inflammation will be more limited to one specific diverticulum section rather than a longer segment of terminal ileum like it currently is. -Today I was not able to discuss the patient's CT scan with interventional radiology.  However tentatively he has been placed on the schedule for tomorrow for his CT to be evaluated by radiology to determine whether it would be feasible to drainage of these abscesses.  Discussed with the family and the patient that it may not be feasible based on the size of these  abscesses and the location.  However I would defer this to radiology.  As a precaution we will allow him to have ice chips and sips of water today and make him n.p.o. after midnight for possible drainage procedure tomorrow.   Melvyn Neth, [facility]

## 2019-02-23 ENCOUNTER — Inpatient Hospital Stay: Payer: Medicare HMO

## 2019-02-23 LAB — GI PATHOGEN PANEL BY PCR, STOOL

## 2019-02-23 LAB — BASIC METABOLIC PANEL
Anion gap: 8 (ref 5–15)
BUN: 9 mg/dL (ref 8–23)
CO2: 28 mmol/L (ref 22–32)
Calcium: 7.3 mg/dL — ABNORMAL LOW (ref 8.9–10.3)
Chloride: 103 mmol/L (ref 98–111)
Creatinine, Ser: 0.96 mg/dL (ref 0.61–1.24)
GFR calc Af Amer: >60 mL/min (ref >60)
GFR calc non Af Amer: >60 mL/min (ref >60)
Glucose, Bld: 102 mg/dL — ABNORMAL HIGH (ref 70–99)
Potassium: 3.5 mmol/L (ref 3.5–5.1)
Sodium: 139 mmol/L (ref 135–145)

## 2019-02-23 LAB — CBC
HCT: 34.8 % — ABNORMAL LOW (ref 39.0–52.0)
Hemoglobin: 12.1 g/dL — ABNORMAL LOW (ref 13.0–17.0)
MCH: 27.9 pg (ref 26.0–34.0)
MCHC: 34.8 g/dL (ref 30.0–36.0)
MCV: 80.4 fL (ref 80.0–100.0)
Platelets: 437 10*3/uL — ABNORMAL HIGH (ref 150–400)
RBC: 4.33 MIL/uL (ref 4.22–5.81)
RDW: 13.5 % (ref 11.5–15.5)
WBC: 14.5 10*3/uL — ABNORMAL HIGH (ref 4.0–10.5)
nRBC: 0 % (ref 0.0–0.2)

## 2019-02-23 LAB — PROTIME-INR
INR: 1.3 — ABNORMAL HIGH (ref 0.8–1.2)
Prothrombin Time: 16.4 seconds — ABNORMAL HIGH (ref 11.4–15.2)

## 2019-02-23 MED ORDER — FENTANYL CITRATE (PF) 100 MCG/2ML IJ SOLN
INTRAMUSCULAR | Status: AC | PRN
  Administered 2019-02-23 (×2): 50 ug via INTRAVENOUS

## 2019-02-23 MED ORDER — ENOXAPARIN SODIUM 40 MG/0.4ML ~~LOC~~ SOLN
40.0000 mg | SUBCUTANEOUS | Status: DC
  Administered 2019-02-24 – 2019-02-27 (×4): 40 mg via SUBCUTANEOUS
  Filled 2019-02-23 (×5): qty 0.4

## 2019-02-23 MED ORDER — MIDAZOLAM HCL 2 MG/2ML IJ SOLN
INTRAMUSCULAR | Status: AC | PRN
  Administered 2019-02-23 (×2): 1 mg via INTRAVENOUS

## 2019-02-23 MED ORDER — FENTANYL CITRATE (PF) 100 MCG/2ML IJ SOLN
INTRAMUSCULAR | Status: AC
  Filled 2019-02-23: qty 2

## 2019-02-23 MED ORDER — LIDOCAINE 5 % EX PTCH
1.0000 | MEDICATED_PATCH | CUTANEOUS | Status: DC
  Filled 2019-02-23 (×6): qty 1

## 2019-02-23 MED ORDER — MIDAZOLAM HCL 2 MG/2ML IJ SOLN
INTRAMUSCULAR | Status: AC
  Filled 2019-02-23: qty 2

## 2019-02-23 MED ORDER — SODIUM CHLORIDE 0.9% FLUSH
5.0000 mL | Freq: Three times a day (TID) | INTRAVENOUS | Status: DC
  Administered 2019-02-23 – 2019-02-28 (×13): 5 mL

## 2019-02-23 MED ORDER — HYDROCODONE-ACETAMINOPHEN 5-325 MG PO TABS
1.0000 | ORAL_TABLET | ORAL | Status: DC | PRN
  Administered 2019-02-23 – 2019-02-24 (×2): 1 via ORAL
  Filled 2019-02-23 (×3): qty 1

## 2019-02-23 NOTE — Progress Notes (Signed)
Todd Antigua, MD [address], [place], [place], [place], [address] [address] [place], [place], [place], [address] Phone: [phone]  Fax: [phone]   Subjective: Patient underwent CT-guided drain placement by IR today and reports feeling much better after it   Objective: Exam: Vital signs in last 24 hours: Vitals:   02/22/19 2219 02/23/19 0400 02/23/19 0643 02/23/19 1022  BP: (!) 148/84  (!) 146/93 (!) 145/90  Pulse: 76  85 80  Resp: 20  18 20   Temp: 98.2 F (36.8 C)  98.1 F (36.7 C)   TempSrc:      SpO2: 96%  95% 95%  Weight:  75.7 kg    Height:       Weight change: 0.856 kg  Intake/Output Summary (Last 24 hours) at 02/23/2019 1043 Last data filed at 02/22/2019 1500 Gross per 24 hour  Intake 1266.39 ml  Output 500 ml  Net 766.39 ml    General: No acute distress, AAO x3 Abd: Soft, NT/ND, No HSM.  Drain in place with watery red output Skin: Warm, no rashes Neck: Supple, Trachea midline   Lab Results: Lab Results  Component Value Date   WBC 14.5 (H) 02/23/2019   HGB 12.1 (L) 02/23/2019   HCT 34.8 (L) 02/23/2019   MCV 80.4 02/23/2019   PLT 437 (H) 02/23/2019   Micro Results: Recent Results (from the past 240 hour(s))  SARS CORONAVIRUS 2 (TAT 6-24 HRS) Nasopharyngeal Nasopharyngeal Swab     Status: None   Collection Time: 02/15/19 12:10 PM   Specimen: Nasopharyngeal Swab  Result Value Ref Range Status   SARS Coronavirus 2 NEGATIVE NEGATIVE Final    Comment: (NOTE) SARS-CoV-2 target nucleic acids are NOT DETECTED. The SARS-CoV-2 RNA is generally detectable in upper and lower respiratory specimens during the acute phase of infection. Negative results do not preclude SARS-CoV-2 infection, do not rule out co-infections with other pathogens, and should not be used as the sole basis for treatment or other patient management decisions. Negative results must be combined with clinical observations, patient history, and epidemiological  information. The expected result is Negative. Fact Sheet for Patients: SugarRoll.be Fact Sheet for Healthcare Providers: https://www.woods-mathews.com/ This test is not yet approved or cleared by the [place] FDA and  has been authorized for detection and/or diagnosis of SARS-CoV-2 by FDA under an Emergency Use Authorization (EUA). This EUA will remain  in effect (meaning this test can be used) for the duration of the COVID-19 declaration under Section 56 4(b)(1) of the Act, 21 U.S.C. section 360bbb-3(b)(1), unless the authorization is terminated or revoked sooner. Performed at [facility], [place] [address] [place], [place] [address]   CULTURE, BLOOD (ROUTINE X 2) w Reflex to ID Panel     Status: None   Collection Time: 02/15/19  1:16 PM   Specimen: BLOOD  Result Value Ref Range Status   Specimen Description BLOOD LEFT ANTECUBITAL  Final   Special Requests   Final    BOTTLES DRAWN AEROBIC AND ANAEROBIC Blood Culture adequate volume   Culture   Final    NO GROWTH 5 DAYS Performed at [facility], [address] [place], [place] [address]    Report Status 02/20/2019 FINAL  Final  CULTURE, BLOOD (ROUTINE X 2) w Reflex to ID Panel     Status: None   Collection Time: 02/15/19  1:16 PM   Specimen: BLOOD  Result Value Ref Range Status   Specimen Description BLOOD RIGHT ANTECUBITAL  Final  Special Requests   Final    BOTTLES DRAWN AEROBIC AND ANAEROBIC Blood Culture adequate volume   Culture   Final    NO GROWTH 5 DAYS Performed at [facility], [address] [place], [place] [address]    Report Status 02/20/2019 FINAL  Final  GI pathogen panel by PCR, stool     Status: None   Collection Time: 02/21/19  4:48 PM   Specimen: Stool  Result Value Ref Range Status   Plesiomonas shigelloides NOT DETECTED NOT DETECTED Final   Yersinia enterocolitica NOT DETECTED NOT DETECTED Final   Vibrio NOT DETECTED NOT  DETECTED Final   Enteropathogenic E coli NOT DETECTED NOT DETECTED Final   E coli (ETEC) LT/ST NOT DETECTED NOT DETECTED Final   E coli 2637 by PCR Not applicable NOT DETECTED Final   Cryptosporidium by PCR NOT DETECTED NOT DETECTED Final   Entamoeba histolytica NOT DETECTED NOT DETECTED Final   Adenovirus F 40/41 NOT DETECTED NOT DETECTED Final   Norovirus GI/GII NOT DETECTED NOT DETECTED Final   Sapovirus NOT DETECTED NOT DETECTED Final    Comment: (NOTE) Performed At: [facility] [place], [place] [id number] Rush Farmer MD AJ:[id number]    Vibrio cholerae NOT DETECTED NOT DETECTED Final   Campylobacter by PCR NOT DETECTED NOT DETECTED Final   Salmonella by PCR NOT DETECTED NOT DETECTED Final   E coli (STEC) NOT DETECTED NOT DETECTED Final   Enteroaggregative E coli NOT DETECTED NOT DETECTED Final   Shigella by PCR NOT DETECTED NOT DETECTED Final   Cyclospora cayetanensis NOT DETECTED NOT DETECTED Final   Astrovirus NOT DETECTED NOT DETECTED Final   G lamblia by PCR NOT DETECTED NOT DETECTED Final   Rotavirus A by PCR NOT DETECTED NOT DETECTED Final  C Difficile Quick Screen w PCR reflex     Status: None   Collection Time: 02/21/19  4:48 PM   Specimen: Stool  Result Value Ref Range Status   C Diff antigen NEGATIVE NEGATIVE Final   C Diff toxin NEGATIVE NEGATIVE Final   C Diff interpretation No C. difficile detected.  Final    Comment: Performed at [facility], [place], [place] [address]  SARS CORONAVIRUS 2 (TAT 6-24 HRS) Nasopharyngeal Nasopharyngeal Swab     Status: None   Collection Time: 02/21/19  6:15 PM   Specimen: Nasopharyngeal Swab  Result Value Ref Range Status   SARS Coronavirus 2 NEGATIVE NEGATIVE Final    Comment: (NOTE) SARS-CoV-2 target nucleic acids are NOT DETECTED. The SARS-CoV-2 RNA is generally detectable in upper and lower respiratory specimens during the acute phase of infection. Negative results do  not preclude SARS-CoV-2 infection, do not rule out co-infections with other pathogens, and should not be used as the sole basis for treatment or other patient management decisions. Negative results must be combined with clinical observations, patient history, and epidemiological information. The expected result is Negative. Fact Sheet for Patients: SugarRoll.be Fact Sheet for Healthcare Providers: https://www.woods-mathews.com/ This test is not yet approved or cleared by the [place] FDA and  has been authorized for detection and/or diagnosis of SARS-CoV-2 by FDA under an Emergency Use Authorization (EUA). This EUA will remain  in effect (meaning this test can be used) for the duration of the COVID-19 declaration under Section 56 4(b)(1) of the Act, 21 U.S.C. section 360bbb-3(b)(1), unless the authorization is terminated or revoked sooner. Performed at [facility], [place] [address] [place], [place] [address]  Studies/Results: DG Abdomen 1 View  Result Date: 02/21/2019 CLINICAL DATA:  Abdominal bloating EXAM: ABDOMEN - 1 VIEW COMPARISON:  None. FINDINGS: Mildly distended loops of small bowel in the left upper quadrant. No significant stool burden. IMPRESSION: Mildly distended small bowel loops in the left upper quadrant. Electronically Signed   By: Macy Mis M.D.   On: 02/21/2019 12:43   CT abd  Result Date: 02/21/2019 CLINICAL DATA:  Abdominal pain and distension EXAM: CT ABDOMEN AND PELVIS WITH CONTRAST TECHNIQUE: Multidetector CT imaging of the abdomen and pelvis was performed using the standard protocol following bolus administration of intravenous contrast. CONTRAST:  127m OMNIPAQUE IOHEXOL 300 MG/ML  SOLN COMPARISON:  02/15/2019 FINDINGS: Lower chest: Small bilateral pleural effusions are noted right greater than left with associated right basilar atelectatic changes. Scattered calcified granulomas are seen. Hepatobiliary:  Tiny hypodensity is noted in the dome of the liver posteriorly best seen on image number 9 of series 2 stable from the prior exam. A few scattered hypodensities are seen stable from the prior study. Status post cholecystectomy. No biliary dilatation. Pancreas: Unremarkable. No pancreatic ductal dilatation or surrounding inflammatory changes. Spleen: Normal in size without focal abnormality. Adrenals/Urinary Tract: Adrenal glands are stable with a small right adrenal nodule identified. Normal enhancement of the kidneys is seen with the exception of a right lower pole mildly enhancing lesion which is stable from the prior exam. No obstructive changes are seen. The bladder is partially distended. Stomach/Bowel: Colon shows no obstructive changes. Some surrounding inflammatory changes noted related to the small bowel inflammatory change. A small amount of free fluid is noted within the pelvis which is new from the prior exam. No free air is seen. Diffuse inflammatory changes of the distal ileum are again identified with multiple diverticuli is some which contain dense material likely related to ingested content. The overall appearance is stable from the prior exam with the exception of a new air-fluid collection identified in the right mid abdomen. The fluid component measures approximately 2.7 x 2.4 cm in greatest dimension. It extends to an area of mottled extraluminal air best seen on image number 60 of series 2. A small adjacent air-fluid collection is noted best seen on image number 57 of series 2. These are consistent with small abscesses likely related to a micro perforation. An additional small air-fluid collection is noted in the right mid abdomen on image number 52 of series 2. Stomach is within normal limits. The proximal small bowel is unremarkable. Vascular/Lymphatic: Duplicated IVC is noted. Aortic calcifications are seen. No significant lymphadenopathy is noted. Reproductive: Prostate is unremarkable.  Other: Free fluid is noted within the pelvis consistent with an interval perforation. No significant free air is noted. Previously described abscesses are noted. Musculoskeletal: Degenerative changes of lumbar spine are seen. No acute bony abnormality is noted. IMPRESSION: Persistent changes likely related to inflammatory bowel disease within the distal ileum. There has been interval development of multiple air-fluid collections within the right mid and central abdomen. The largest of these measures approximately 2.7 x 2.4 cm as described above. The air-fluid collections appear to inter communicate enter likely related to rupture of a small ileal diverticulum. Mild free fluid in the pelvis is noted. Bilateral pleural effusions with right basilar atelectasis. The remainder of the exam is stable from the prior study. Electronically Signed   By: MInez CatalinaM.D.   On: 02/21/2019 16:08   Medications:  Scheduled Meds: . fentaNYL      . influenza vaccine adjuvanted  0.5 mL Intramuscular Tomorrow-1000  . loratadine  10 mg Oral Daily  . midazolam       Continuous Infusions: . ceFEPime (MAXIPIME) IV 2 g (02/23/19 0300)  . dextrose 5 % and 0.9 % NaCl with KCl 40 mEq/L 100 mL/hr at 02/23/19 0259  . metronidazole 500 mg (02/23/19 0359)   PRN Meds:.acetaminophen **OR** acetaminophen, hydrocortisone cream, ondansetron **OR** ondansetron (ZOFRAN) IV, traZODone   Assessment: Principal Problem:   Ileitis, terminal, with abscess ([place]) Active Problems:   Hypokalemia   Diarrhea   Rupture of bowel ([place])    Plan: GI panel and C. difficile negative Drain placement completed by IR today Continue antibiotics Close follow-up with surgery given multiple abscesses  Follow-up in GI clinic as an outpatient closely as well to determine appropriate and safe timeline for colonoscopy for visualization, possibly after drain removal   LOS: 2 days   Todd Antigua, MD 02/23/2019, 10:43 AM

## 2019-02-23 NOTE — Progress Notes (Signed)
Per MD okay for pt to have ice chips and sips with meds.

## 2019-02-23 NOTE — Progress Notes (Signed)
Progress Note    Todd Briggs  TKZ:[id number] DOB: 17-Mar-1945  DOA: 02/21/2019 PCP: Sofie Hartigan, MD      Brief Narrative:    Medical records reviewed and are as summarized below:   Todd Briggs is an 73 y.o. 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  At that time, he was discharged on ciprofloxacin and Flagyl.  He presented to the hospital today because of worsening diarrhea, generalized weakness and abdominal pain.  He describes the pain as "gas pain".  It was quite severe.  It was nonradiating and there was no known relieving or aggravating factors.  Pain was mainly located in the mid abdomen and right lower quadrant area.  He said he had loose watery stools almost every 30 minutes the day before admission.  His symptoms were associated with abdominal bloating. Stools are nonbloody.  He did not have any fever, chills, nausea, vomiting.  ED Course:  The patient had a CT scan of the abdomen and pelvis which showed persistent changes likely related to inflammatory bowel disease within the distal ileum with interval development of multiple air-fluid collections/abscesses within the right mid and central abdomen.  He was given IV antibiotics, oral and IV potassium and IV fluids in the ED.  He was seen in consultation by the general surgeon who recommended conservative management.    Assessment/Plan:   Principal Problem:   Ileitis, terminal, with abscess ([place]) Active Problems:   Hypokalemia   Diarrhea   Rupture of bowel ([facility])   Body mass index is 23.95 kg/m.    Terminal ileitis with microabscesses:  -Status post IR guided aspiration and a drainage placement on 02/23/2019.  Currently recovering -appreciate interventional radiology, general  surgery and GI recommendations - Keep n.p.o. for now until cleared by surgery -  Continue IV fluids and empiric IV antibiotics  -GI panel negative for C. difficile -  Analgesics as needed for pain -Leukocytosis somewhat fluctuating but less than admission; no fevers -Close monitoring  Hypokalemia: Resolved this morning  - Replete potassium.    Bilateral pleural effusions with right basilar atelectasis on CT abdomen pelvis: Incidental finding and no acute issues.   - He is tolerating room air -Continue to monitor  Right renal mass: He has a follow-up appointment with urologist.  History of IBS/chronic diarrhea --symptomatic management at this point   Family Communication/Anticipated D/C date and plan/Code Status   DVT prophylaxis: Lovenox Code Status: Full code Family Communication: Discussed with the patient  disposition Plan: To be determined     Subjective:   Patient was somewhat exhausted this morning was unable to sleep overnight.  Status post IR guided procedure.  Did not have any hematochezia or hematemesis.  He does not have much abdominal pain.  No vomiting, fever or chills.  Objective:    Vitals:   02/23/19 1055 02/23/19 1100 02/23/19 1105 02/23/19 1128  BP: 136/75 131/71  (!) 142/78  Pulse: 76 80 80 73  Resp: 16 18 18 18   Temp:    97.9 F (36.6 C)  TempSrc:    Oral  SpO2: 98% 97% 97% 94%  Weight:      Height:        Intake/Output Summary (Last 24 hours) at 02/23/2019 1354 Last data filed at 02/22/2019 1500 Gross per 24 hour  Intake  1266.39 ml  Output 500 ml  Net 766.39 ml   Filed Weights   02/21/19 0846 02/21/19 0854 02/23/19 0400  Weight: 74.8 kg 74.8 kg 75.7 kg    Exam:  GEN: NAD SKIN: Diffuse erythematous maculopapular rash on his back EYES: No pallor or icterus  ENT: MMM CV: RRR PULM: CTA B ABD: soft, ND, NT, +BS; no guarding or rebound tenderness CNS: AAO x 3, non focal EXT: No edema or tenderness   Data Reviewed:   I have  personally reviewed following labs and imaging studies:  Labs: Labs show the following:   Basic Metabolic Panel: Recent Labs  Lab 02/21/19 0900 02/22/19 0539 02/23/19 0459  NA 135 139 139  K 2.8* 3.0* 3.5  CL 97* 100 103  CO2 27 28 28   GLUCOSE 123* 97 102*  BUN 13 10 9   CREATININE 0.98 0.99 0.96  CALCIUM 7.5* 7.1* 7.3*  MG 1.9 2.0  --   PHOS 3.2  --   --    GFR Estimated Creatinine Clearance: 68.6 mL/min (by C-G formula based on SCr of 0.96 mg/dL). Liver Function Tests: Recent Labs  Lab 02/21/19 0900  AST 32  ALT 22  ALKPHOS 106  BILITOT 0.8  PROT 5.6*  ALBUMIN 2.4*   Recent Labs  Lab 02/21/19 0900  LIPASE 33   No results for input(s): AMMONIA in the last 168 hours. Coagulation profile Recent Labs  Lab 02/23/19 0459  INR 1.3*    CBC: Recent Labs  Lab 02/21/19 0900 02/22/19 0539 02/23/19 0459  WBC 16.4* 12.9* 14.5*  HGB 12.4* 11.8* 12.1*  HCT 36.8* 34.2* 34.8*  MCV 81.8 79.5* 80.4  PLT 380 384 437*   Cardiac Enzymes: No results for input(s): CKTOTAL, CKMB, CKMBINDEX, TROPONINI in the last 168 hours. BNP (last 3 results) No results for input(s): PROBNP in the last 8760 hours. CBG: No results for input(s): GLUCAP in the last 168 hours. D-Dimer: No results for input(s): DDIMER in the last 72 hours. Hgb A1c: No results for input(s): HGBA1C in the last 72 hours. Lipid Profile: No results for input(s): CHOL, HDL, LDLCALC, TRIG, CHOLHDL, LDLDIRECT in the last 72 hours. Thyroid function studies: No results for input(s): TSH, T4TOTAL, T3FREE, THYROIDAB in the last 72 hours.  Invalid input(s): FREET3 Anemia work up: No results for input(s): VITAMINB12, FOLATE, FERRITIN, TIBC, IRON, RETICCTPCT in the last 72 hours. Sepsis Labs: Recent Labs  Lab 02/21/19 0900 02/22/19 0539 02/23/19 0459  WBC 16.4* 12.9* 14.5*    Microbiology Recent Results (from the past 240 hour(s))  SARS CORONAVIRUS 2 (TAT 6-24 HRS) Nasopharyngeal Nasopharyngeal Swab      Status: None   Collection Time: 02/15/19 12:10 PM   Specimen: Nasopharyngeal Swab  Result Value Ref Range Status   SARS Coronavirus 2 NEGATIVE NEGATIVE Final    Comment: (NOTE) SARS-CoV-2 target nucleic acids are NOT DETECTED. The SARS-CoV-2 RNA is generally detectable in upper and lower respiratory specimens during the acute phase of infection. Negative results do not preclude SARS-CoV-2 infection, do not rule out co-infections with other pathogens, and should not be used as the sole basis for treatment or other patient management decisions. Negative results must be combined with clinical observations, patient history, and epidemiological information. The expected result is Negative. Fact Sheet for Patients: SugarRoll.be Fact Sheet for Healthcare Providers: https://www.woods-mathews.com/ This test is not yet approved or cleared by the [place] FDA and  has been authorized for detection and/or diagnosis of SARS-CoV-2 by FDA under an Emergency Use  Authorization (EUA). This EUA will remain  in effect (meaning this test can be used) for the duration of the COVID-19 declaration under Section 56 4(b)(1) of the Act, 21 U.S.C. section 360bbb-3(b)(1), unless the authorization is terminated or revoked sooner. Performed at [facility], [facility] [address], [place], [place] [address]   CULTURE, BLOOD (ROUTINE X 2) w Reflex to ID Panel     Status: None   Collection Time: 02/15/19  1:16 PM   Specimen: BLOOD  Result Value Ref Range Status   Specimen Description BLOOD LEFT ANTECUBITAL  Final   Special Requests   Final    BOTTLES DRAWN AEROBIC AND ANAEROBIC Blood Culture adequate volume   Culture   Final    NO GROWTH 5 DAYS Performed at [facility], [address] [place], [place] [address]    Report Status 02/20/2019 FINAL  Final  CULTURE, BLOOD (ROUTINE X 2) w Reflex to ID Panel     Status: None   Collection Time: 02/15/19  1:16  PM   Specimen: BLOOD  Result Value Ref Range Status   Specimen Description BLOOD RIGHT ANTECUBITAL  Final   Special Requests   Final    BOTTLES DRAWN AEROBIC AND ANAEROBIC Blood Culture adequate volume   Culture   Final    NO GROWTH 5 DAYS Performed at [facility], [address] [place], [place] [address]    Report Status 02/20/2019 FINAL  Final  GI pathogen panel by PCR, stool     Status: None   Collection Time: 02/21/19  4:48 PM   Specimen: Stool  Result Value Ref Range Status   Plesiomonas shigelloides NOT DETECTED NOT DETECTED Final   Yersinia enterocolitica NOT DETECTED NOT DETECTED Final   Vibrio NOT DETECTED NOT DETECTED Final   Enteropathogenic E coli NOT DETECTED NOT DETECTED Final   E coli (ETEC) LT/ST NOT DETECTED NOT DETECTED Final   E coli 1324 by PCR Not applicable NOT DETECTED Final   Cryptosporidium by PCR NOT DETECTED NOT DETECTED Final   Entamoeba histolytica NOT DETECTED NOT DETECTED Final   Adenovirus F 40/41 NOT DETECTED NOT DETECTED Final   Norovirus GI/GII NOT DETECTED NOT DETECTED Final   Sapovirus NOT DETECTED NOT DETECTED Final    Comment: (NOTE) Performed At: [facility] [address] [place], [place] [id number] Rush Farmer MD GU:[id number]    Vibrio cholerae NOT DETECTED NOT DETECTED Final   Campylobacter by PCR NOT DETECTED NOT DETECTED Final   Salmonella by PCR NOT DETECTED NOT DETECTED Final   E coli (STEC) NOT DETECTED NOT DETECTED Final   Enteroaggregative E coli NOT DETECTED NOT DETECTED Final   Shigella by PCR NOT DETECTED NOT DETECTED Final   Cyclospora cayetanensis NOT DETECTED NOT DETECTED Final   Astrovirus NOT DETECTED NOT DETECTED Final   G lamblia by PCR NOT DETECTED NOT DETECTED Final   Rotavirus A by PCR NOT DETECTED NOT DETECTED Final  C Difficile Quick Screen w PCR reflex     Status: None   Collection Time: 02/21/19  4:48 PM   Specimen: Stool  Result Value Ref Range Status   C Diff antigen NEGATIVE  NEGATIVE Final   C Diff toxin NEGATIVE NEGATIVE Final   C Diff interpretation No C. difficile detected.  Final    Comment: Performed at [facility], [place], [place] [address]  SARS CORONAVIRUS 2 (TAT 6-24 HRS) Nasopharyngeal Nasopharyngeal Swab     Status: None   Collection  Time: 02/21/19  6:15 PM   Specimen: Nasopharyngeal Swab  Result Value Ref Range Status   SARS Coronavirus 2 NEGATIVE NEGATIVE Final    Comment: (NOTE) SARS-CoV-2 target nucleic acids are NOT DETECTED. The SARS-CoV-2 RNA is generally detectable in upper and lower respiratory specimens during the acute phase of infection. Negative results do not preclude SARS-CoV-2 infection, do not rule out co-infections with other pathogens, and should not be used as the sole basis for treatment or other patient management decisions. Negative results must be combined with clinical observations, patient history, and epidemiological information. The expected result is Negative. Fact Sheet for Patients: SugarRoll.be Fact Sheet for Healthcare Providers: https://www.woods-mathews.com/ This test is not yet approved or cleared by the [place] FDA and  has been authorized for detection and/or diagnosis of SARS-CoV-2 by FDA under an Emergency Use Authorization (EUA). This EUA will remain  in effect (meaning this test can be used) for the duration of the COVID-19 declaration under Section 56 4(b)(1) of the Act, 21 U.S.C. section 360bbb-3(b)(1), unless the authorization is terminated or revoked sooner. Performed at [facility], [place] [address], [place], [place] [address]     Procedures and diagnostic studies:  CT abd  Result Date: 02/21/2019 CLINICAL DATA:  Abdominal pain and distension EXAM: CT ABDOMEN AND PELVIS WITH CONTRAST TECHNIQUE: Multidetector CT imaging of the abdomen and pelvis was performed using the standard protocol following bolus  administration of intravenous contrast. CONTRAST:  186m OMNIPAQUE IOHEXOL 300 MG/ML  SOLN COMPARISON:  02/15/2019 FINDINGS: Lower chest: Small bilateral pleural effusions are noted right greater than left with associated right basilar atelectatic changes. Scattered calcified granulomas are seen. Hepatobiliary: Tiny hypodensity is noted in the dome of the liver posteriorly best seen on image number 9 of series 2 stable from the prior exam. A few scattered hypodensities are seen stable from the prior study. Status post cholecystectomy. No biliary dilatation. Pancreas: Unremarkable. No pancreatic ductal dilatation or surrounding inflammatory changes. Spleen: Normal in size without focal abnormality. Adrenals/Urinary Tract: Adrenal glands are stable with a small right adrenal nodule identified. Normal enhancement of the kidneys is seen with the exception of a right lower pole mildly enhancing lesion which is stable from the prior exam. No obstructive changes are seen. The bladder is partially distended. Stomach/Bowel: Colon shows no obstructive changes. Some surrounding inflammatory changes noted related to the small bowel inflammatory change. A small amount of free fluid is noted within the pelvis which is new from the prior exam. No free air is seen. Diffuse inflammatory changes of the distal ileum are again identified with multiple diverticuli is some which contain dense material likely related to ingested content. The overall appearance is stable from the prior exam with the exception of a new air-fluid collection identified in the right mid abdomen. The fluid component measures approximately 2.7 x 2.4 cm in greatest dimension. It extends to an area of mottled extraluminal air best seen on image number 60 of series 2. A small adjacent air-fluid collection is noted best seen on image number 57 of series 2. These are consistent with small abscesses likely related to a micro perforation. An additional small air-fluid  collection is noted in the right mid abdomen on image number 52 of series 2. Stomach is within normal limits. The proximal small bowel is unremarkable. Vascular/Lymphatic: Duplicated IVC is noted. Aortic calcifications are seen. No significant lymphadenopathy is noted. Reproductive: Prostate is unremarkable. Other: Free fluid is noted within the pelvis consistent with an interval  perforation. No significant free air is noted. Previously described abscesses are noted. Musculoskeletal: Degenerative changes of lumbar spine are seen. No acute bony abnormality is noted. IMPRESSION: Persistent changes likely related to inflammatory bowel disease within the distal ileum. There has been interval development of multiple air-fluid collections within the right mid and central abdomen. The largest of these measures approximately 2.7 x 2.4 cm as described above. The air-fluid collections appear to inter communicate enter likely related to rupture of a small ileal diverticulum. Mild free fluid in the pelvis is noted. Bilateral pleural effusions with right basilar atelectasis. The remainder of the exam is stable from the prior study. Electronically Signed   By: Inez Catalina M.D.   On: 02/21/2019 16:08   CT IMAGE GUIDED DRAINAGE BY PERCUTANEOUS CATHETER  Result Date: 02/23/2019 INDICATION: Concern for inflammatory bowel disease, now with enteric perforation. Please perform CT-guided aspiration and/or drainage catheter placement for infection source control purposes. EXAM: CT IMAGE GUIDED DRAINAGE BY PERCUTANEOUS CATHETER COMPARISON:  CT abdomen pelvis-02/21/2019; 02/15/2019 MEDICATIONS: The patient is currently admitted to the hospital and receiving intravenous antibiotics. The antibiotics were administered within an appropriate time frame prior to the initiation of the procedure. ANESTHESIA/SEDATION: Moderate (conscious) sedation was employed during this procedure. A total of Versed 2 mg and Fentanyl 100 mcg was administered  intravenously. Moderate Sedation Time: 11 minutes. The patient's level of consciousness and vital signs were monitored continuously by radiology nursing throughout the procedure under my direct supervision. CONTRAST:  None COMPLICATIONS: None immediate. PROCEDURE: Informed written consent was obtained from the patient after a discussion of the risks, benefits and alternatives to treatment. The patient was placed supine on the CT gantry and a pre procedural CT was performed re-demonstrating the known interloop abscess/fluid collection within the right lower abdomen/pelvis with dominant bilobed serpiginous component measuring approximately 6.5 x 2.3 cm (image 26, series 2). The procedure was planned. A timeout was performed prior to the initiation of the procedure. The skin overlying the anterolateral aspect of the right lower abdomen was prepped and draped in the usual sterile fashion. The overlying soft tissues were anesthetized with 1% lidocaine with epinephrine. Appropriate trajectory was planned with the use of a 22 gauge spinal needle. An 18 gauge trocar needle was advanced into the abscess/fluid collection and a short Amplatz super stiff wire was coiled within the collection. Appropriate positioning was confirmed with a limited CT scan. The tract was serially dilated allowing placement of a 10 [place] all-purpose drainage catheter. Appropriate positioning was confirmed with a limited postprocedural CT scan. Approximately 35 ml of purulent fluid was aspirated. The tube was connected to a JP bulb and sutured in place. A dressing was placed. The patient tolerated the procedure well without immediate post procedural complication. IMPRESSION: Successful CT guided placement of a 10 French all purpose drain catheter into the interloop abscess within the right lower abdomen/pelvis with aspiration of 35 mL of purulent fluid. Samples were sent to the laboratory as requested by the ordering clinical team. Electronically  Signed   By: Sandi Mariscal M.D.   On: 02/23/2019 12:30    Medications:   . [START ON 02/24/2019] enoxaparin (LOVENOX) injection  40 mg Subcutaneous Q24H  . fentaNYL      . influenza vaccine adjuvanted  0.5 mL Intramuscular Tomorrow-1000  . loratadine  10 mg Oral Daily  . midazolam       Continuous Infusions: . ceFEPime (MAXIPIME) IV 2 g (02/23/19 1131)  . dextrose 5 % and 0.9 % NaCl  with KCl 40 mEq/L 100 mL/hr at 02/23/19 0259  . metronidazole 500 mg (02/23/19 1209)     LOS: 2 days   [place]  [facility]   *Please refer to [email or web address], password [email or web address] to get updated schedule on who will round on this patient, as hospitalists switch teams weekly. If 7PM-7AM, please contact night-coverage at [email or web address], password [email or web address] for any overnight needs.  02/23/2019, 1:54 PM

## 2019-02-23 NOTE — Consult Note (Signed)
Chief Complaint: Enteric perforation potentially secondary to inflammatory bowel disease   Referring Physician(s): Piscoya (Surgery)  Patient Status: [facility] [facility] - In-pt  History of Present Illness: Todd Briggs is a 73 y.o. male with past medical history significant for diabetes, GERD, peptic ulcer who is now omitted to the hospital with abdominal pain with CT scan of the abdomen pelvis performed 02/15/2019 demonstrating findings worrisome for inflammatory bowel disease primarily involving the terminal ileum.  Subsequent CT scan was performed 02/21/2019 demonstrating findings worrisome for development of a microperforation and abscess, and as such, request made for attempted CT-guided aspiration and/or drainage catheter placement.  Patient currently denies abdominal pain.  He denies fever or chills.  No chest pain or shortness of breath.  Past Medical History:  Diagnosis Date  . Allergy    Seasonal  . Anemia   . Arthritis   . Basal cell carcinoma    Removed 1980's  . Cataracts, both eyes   . Diabetes mellitus without complication (HCC)    Borderline  . GERD (gastroesophageal reflux disease)   . IBS (irritable bowel syndrome)   . Peptic ulcer 73 years old  . Scarlet fever 73 years old  . Shortness of breath dyspnea    with exertion    Past Surgical History:  Procedure Laterality Date  . CHOLECYSTECTOMY N/A 03/30/2015   Procedure: LAPAROSCOPIC CHOLECYSTECTOMY;  Surgeon: Hubbard Robinson, MD;  Location: [facility] ORS;  Service: General;  Laterality: N/A;  . HERNIA REPAIR Bilateral 73 years old   Inguinal Hernia    Allergies: Penicillins  Medications: Prior to Admission medications   Medication Sig Start Date End Date Taking? Authorizing Provider  cetirizine (ZYRTEC) 10 MG tablet Take 10 mg by mouth daily.   Yes [provider]  ibuprofen (ADVIL) 200 MG tablet Take 400-600 mg by mouth every 6 (six) hours as needed for fever or mild pain.   Yes [provider]     Family History  Problem Relation Age of Onset  . Hypertension Mother   . Diabetes Mother   . Pancreatitis Mother   . Heart disease Mother   . Heart attack Father   . COPD Father   . Heart disease Father     Social History   Socioeconomic History  . Marital status: Married    Spouse name: Not on file  . Number of children: Not on file  . Years of education: Not on file  . Highest education level: Not on file  Occupational History  . Not on file  Tobacco Use  . Smoking status: Former Smoker    Packs/day: 1.00    Types: Cigarettes    Quit date: 02/28/1985    Years since quitting: 34.0  . Smokeless tobacco: Never Used  Substance and Sexual Activity  . Alcohol use: No  . Drug use: No  . Sexual activity: Not on file  Other Topics Concern  . Not on file  Social History Narrative  . Not on file   Social Determinants of Health   Financial Resource Strain:   . Difficulty of Paying Living Expenses: Not on file  Food Insecurity:   . Worried About Charity fundraiser in the Last Year: Not on file  . Ran Out of Food in the Last Year: Not on file  Transportation Needs:   . Lack of Transportation (Medical): Not on file  . Lack of Transportation (Non-Medical): Not on file  Physical Activity:   . Days of Exercise per  Week: Not on file  . Minutes of Exercise per Session: Not on file  Stress:   . Feeling of Stress : Not on file  Social Connections:   . Frequency of Communication with Friends and Family: Not on file  . Frequency of Social Gatherings with Friends and Family: Not on file  . Attends Religious Services: Not on file  . Active Member of Clubs or Organizations: Not on file  . Attends Archivist Meetings: Not on file  . Marital Status: Not on file    ECOG Status: 2 - Symptomatic, <50% confined to bed  Review of Systems: A 12 point ROS discussed and pertinent positives are indicated in the HPI above.  All other systems are  negative.  Review of Systems  Vital Signs: BP 131/71   Pulse 80   Temp 98.1 F (36.7 C)   Resp 18   Ht 5' 10"  (1.778 m)   Wt 75.7 kg   SpO2 97%   BMI 23.95 kg/m   Physical Exam  Imaging: DG Abdomen 1 View  Result Date: 02/21/2019 CLINICAL DATA:  Abdominal bloating EXAM: ABDOMEN - 1 VIEW COMPARISON:  None. FINDINGS: Mildly distended loops of small bowel in the left upper quadrant. No significant stool burden. IMPRESSION: Mildly distended small bowel loops in the left upper quadrant. Electronically Signed   By: Macy Mis M.D.   On: 02/21/2019 12:43   MR ABDOMEN W WO CONTRAST  Result Date: 02/16/2019 CLINICAL DATA:  Right renal lesion on CT. EXAM: MRI ABDOMEN WITHOUT AND WITH CONTRAST TECHNIQUE: Multiplanar multisequence MR imaging of the abdomen was performed both before and after the administration of intravenous contrast. CONTRAST:  36m GADAVIST GADOBUTROL 1 MMOL/ML IV SOLN COMPARISON:  CT abdomen/pelvis dated 02/15/2019 FINDINGS: Lower chest: Small bilateral pleural effusions. Associated lower lobe atelectasis. Hepatobiliary: Liver is within normal limits. No suspicious/enhancing hepatic lesions. No hepatic steatosis. Status post cholecystectomy. No intrahepatic or extrahepatic ductal dilatation. Pancreas:  Within normal limits. Spleen:  Within normal limits. Adrenals/Urinary Tract:  Adrenal glands are within normal limits. 1.9 x 2.1 x 1.7 cm solid enhancing mass in the right lower kidney (series 13/image 34), compatible with solid renal neoplasm such as renal cell carcinoma. Left kidney is within normal limits. No hydronephrosis. Stomach/Bowel: Stomach is within normal limits. On coronal HASTE imaging, the terminal ileum is mildly thick-walled although underdistended (series 2/image 21). Visualized bowel is otherwise grossly unremarkable. Vascular/Lymphatic:  No evidence of abdominal aortic aneurysm. Single right renal artery and vein.  No renal vein invasion. 6 mm short axis right  para-aortic node inferior to the right renal vein (series 5/image 28), nonspecific. Other: No abdominal ascites. Mild mesenteric edema in the right mid abdomen (series 5/image 35). Musculoskeletal: No focal osseous lesions. IMPRESSION: 2.1 cm solid enhancing mass in the right lower kidney, compatible with solid renal neoplasm such as renal cell carcinoma. Single right renal artery and vein.  No renal vein invasion. 6 mm short axis right para-aortic node inferior to the right renal vein, nonspecific, technically not pathologically enlarged. Consider attention on follow-up. No findings specific for metastatic disease. 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 Electronically Signed   By: SJulian HyM.D.   On: 02/16/2019 15:16   CT abd  Result Date: 02/21/2019 CLINICAL DATA:  Abdominal pain and distension EXAM: CT ABDOMEN AND PELVIS WITH CONTRAST TECHNIQUE: Multidetector CT imaging of the abdomen and pelvis was performed using the standard protocol following  bolus administration of intravenous contrast. CONTRAST:  185m OMNIPAQUE IOHEXOL 300 MG/ML  SOLN COMPARISON:  02/15/2019 FINDINGS: Lower chest: Small bilateral pleural effusions are noted right greater than left with associated right basilar atelectatic changes. Scattered calcified granulomas are seen. Hepatobiliary: Tiny hypodensity is noted in the dome of the liver posteriorly best seen on image number 9 of series 2 stable from the prior exam. A few scattered hypodensities are seen stable from the prior study. Status post cholecystectomy. No biliary dilatation. Pancreas: Unremarkable. No pancreatic ductal dilatation or surrounding inflammatory changes. Spleen: Normal in size without focal abnormality. Adrenals/Urinary Tract: Adrenal glands are stable with a small right adrenal nodule identified. Normal enhancement of the kidneys is seen with the exception of a right  lower pole mildly enhancing lesion which is stable from the prior exam. No obstructive changes are seen. The bladder is partially distended. Stomach/Bowel: Colon shows no obstructive changes. Some surrounding inflammatory changes noted related to the small bowel inflammatory change. A small amount of free fluid is noted within the pelvis which is new from the prior exam. No free air is seen. Diffuse inflammatory changes of the distal ileum are again identified with multiple diverticuli is some which contain dense material likely related to ingested content. The overall appearance is stable from the prior exam with the exception of a new air-fluid collection identified in the right mid abdomen. The fluid component measures approximately 2.7 x 2.4 cm in greatest dimension. It extends to an area of mottled extraluminal air best seen on image number 60 of series 2. A small adjacent air-fluid collection is noted best seen on image number 57 of series 2. These are consistent with small abscesses likely related to a micro perforation. An additional small air-fluid collection is noted in the right mid abdomen on image number 52 of series 2. Stomach is within normal limits. The proximal small bowel is unremarkable. Vascular/Lymphatic: Duplicated IVC is noted. Aortic calcifications are seen. No significant lymphadenopathy is noted. Reproductive: Prostate is unremarkable. Other: Free fluid is noted within the pelvis consistent with an interval perforation. No significant free air is noted. Previously described abscesses are noted. Musculoskeletal: Degenerative changes of lumbar spine are seen. No acute bony abnormality is noted. IMPRESSION: Persistent changes likely related to inflammatory bowel disease within the distal ileum. There has been interval development of multiple air-fluid collections within the right mid and central abdomen. The largest of these measures approximately 2.7 x 2.4 cm as described above. The air-fluid  collections appear to inter communicate enter likely related to rupture of a small ileal diverticulum. Mild free fluid in the pelvis is noted. Bilateral pleural effusions with right basilar atelectasis. The remainder of the exam is stable from the prior study. Electronically Signed   By: MInez CatalinaM.D.   On: 02/21/2019 16:08   CT Abdomen Pelvis W Contrast  Result Date: 02/15/2019 CLINICAL DATA:  Abdominal pain since last evening. History of irritable bowel syndrome. EXAM: CT ABDOMEN AND PELVIS WITH CONTRAST TECHNIQUE: Multidetector CT imaging of the abdomen and pelvis was performed using the standard protocol following bolus administration of intravenous contrast. CONTRAST:  1061mOMNIPAQUE IOHEXOL 300 MG/ML  SOLN COMPARISON:  None. FINDINGS: Lower chest: Bibasilar scarring and subpleural atelectasis. No worrisome pulmonary lesions or pleural effusion. The heart is normal in size. No pericardial effusion. Hepatobiliary: A few tiny low-attenuation lesions are likely benign cysts. The gallbladder is surgically absent. There is mild associated intra and extrahepatic biliary dilatation. Pancreas: No mass, inflammation or ductal  dilatation. Spleen: Normal size.  No focal lesions. Adrenals/Urinary Tract: Small right adrenal gland nodule, unchanged since a chest CT from 2018 and consistent with a benign adenoma. The left adrenal gland is unremarkable. There is an indeterminate lesion in the lower pole region of the right kidney measuring approximately 21.5 mm. Findings suspicious for a small solid renal neoplasm. A hyperdense/hemorrhagic cyst is possible but this will need further evaluation with MRI. The left kidney is unremarkable. Stomach/Bowel: The stomach is unremarkable. The duodenum and proximal loops of jejunum appear relatively normal. Mild dilatation of the more distal jejunal loops with scattered air-fluid levels. Severe inflammatory or infectious ileitis is noted. There is marked mucosal and serosal  enhancement and submucosal edema. The distal loops of ileum are very irregular and some of them demonstrate straightening and areas of traction. There also multiple ileal diverticuli. Some fibrofatty infiltrated type changes are suggestive of acute on chronic ileitis. The terminal ileum is also abnormal. Recommend GI consultation as patient could have Crohn's disease. This could be causing a mild functional obstruction but I do not see an obvious mechanical obstruction. No evidence of fistula or abscess. The colon is relatively decompressed. No colonic inflammatory process. Vascular/Lymphatic: Scattered aortic calcifications but no aneurysm or dissection. The branch vessels are patent. The major venous structures are patent. 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 Small scattered mesenteric and retroperitoneal lymph nodes likely associated with the small bowel inflammatory process. There is also mild mesenteric edema and a small amount of mesenteric fluid. Reproductive: The prostate gland is slightly enlarged. The seminal vesicles appear normal. Other: No free pelvic fluid collections, pelvic abscess or adenopathy. No inguinal mass or adenopathy. Musculoskeletal: No significant bony findings. Moderate lower lumbar degenerative disc disease and facet disease. IMPRESSION: 1. Fairly extensive inflammatory or infectious ileitis as detailed above. I suspect this is acute on chronic disease. Crohn's disease is certainly a possibility. This may be causing a mild functional obstruction with dilated bowel but no mechanical obstruction is demonstrated. Patient may need GI consultation. 2. Indeterminate 2 cm lower pole right renal lesion worrisome for solid renal neoplasm. Recommend MRI abdomen without and with contrast for further evaluation. Patient may need urology consultation. 3. Scattered ileal diverticuli but no findings for diverticulitis. 4. Status post cholecystectomy.  No biliary dilatation. Electronically Signed    By: Marijo Sanes M.D.   On: 02/15/2019 08:22    Labs:  CBC: Recent Labs    02/16/19 0730 02/21/19 0900 02/22/19 0539 02/23/19 0459  WBC 14.6* 16.4* 12.9* 14.5*  HGB 12.1* 12.4* 11.8* 12.1*  HCT 36.4* 36.8* 34.2* 34.8*  PLT 193 380 384 437*    COAGS: Recent Labs    02/23/19 0459  INR 1.3*    BMP: Recent Labs    02/16/19 0730 02/21/19 0900 02/22/19 0539 02/23/19 0459  NA 137 135 139 139  K 3.2* 2.8* 3.0* 3.5  CL 103 97* 100 103  CO2 23 27 28 28   GLUCOSE 98 123* 97 102*  BUN 28* 13 10 9   CALCIUM 7.1* 7.5* 7.1* 7.3*  CREATININE 1.19 0.98 0.99 0.96  GFRNONAA 59* >60 >60 >60  GFRAA >60 >60 >60 >60    LIVER FUNCTION TESTS: Recent Labs    02/15/19 0728 02/21/19 0900  BILITOT 2.1* 0.8  AST 32 32  ALT 17 22  ALKPHOS 42 106  PROT 6.2* 5.6*  ALBUMIN 3.5 2.4*    TUMOR MARKERS: No results for input(s): AFPTM, CEA, CA199, CHROMGRNA in the last 8760 hours.  Assessment and Plan:  Todd Briggs is a 72 y.o. male with past medical history significant for diabetes, GERD, peptic ulcer who is now omitted to the hospital with abdominal pain with CT scan of the abdomen pelvis performed 02/15/2019 demonstrating findings worrisome for inflammatory bowel disease primarily involving the terminal ileum.  Subsequent CT scan was performed 02/21/2019 demonstrating findings worrisome for development of a microperforation and abscess, and as such, request made for attempted CT-guided aspiration and/or drainage catheter placement.  Risks and benefits of CT-guided aspiration and/or drainage catheter placement was discussed with the patient including bleeding, infection, damage to adjacent structures, bowel perforation/fistula connection, and sepsis.  All of the patient's questions were answered, patient is agreeable to proceed.  Consent signed and in chart.  Thank you for this interesting consult.  I greatly enjoyed meeting Todd Briggs and look forward to participating in  their care.  A copy of this report was sent to the requesting provider on this date.  Electronically Signed: Sandi Mariscal, MD 02/23/2019, 11:22 AM   I spent a total of 20 Minutes in face to face in clinical consultation, greater than 50% of which was counseling/coordinating care for CT-guided abdominal aspiration and/or drainage catheter placement

## 2019-02-23 NOTE — Progress Notes (Signed)
Patient clinically stable post Abscess Drain placement per Dr Pascal Lux, tolerated well. Vitals stable throughout entire procedure and after procedure. Awake/alert and oriented post procedure. Denies complaints at this time. Dressing dry and intact to drain site right lower abdomen. Received Versed 32m along with Fentanyl 1066m IV for procedure. 3582murulent light brown to pink drainage removed with insertion with specimens to lab. Patient transferred back to his room post procedure and post recovery , report given to JuaAzucena Fallenth questions answered.

## 2019-02-23 NOTE — Procedures (Signed)
Pre procedural Dx: Enteric perforation Post procedural Dx: Same  Technically successful CT guided placed of a 10 Fr drainage catheter placement into the right lower abdominal interloop abscess yielding 35 cc of purulent fluid.    All aspirated samples sent to the laboratory for analysis.    EBL: Minimal Complications: None immediate  Ronny Bacon, MD Pager #: [phone]

## 2019-02-23 NOTE — Progress Notes (Signed)
Per MD okay for RN advance diet order to clear liquids.

## 2019-02-23 NOTE — Progress Notes (Signed)
02/23/2019  Subjective: Patient reports he had a rougher night.  He said he was having some discomfort while off the antibiotics and got better when the antibiotic infusion started again.  Reports the diarrhea is better and the GI panel finally is back negative.  His WBC did worsen today to 14.5 from 12.9.  Vital signs: Temp:  [97.4 F (36.3 C)-98.2 F (36.8 C)] 97.9 F (36.6 C) (12/30 1128) Pulse Rate:  [71-85] 73 (12/30 1128) Resp:  [12-20] 18 (12/30 1128) BP: (131-438)/(70-93) 142/78 (12/30 1128) SpO2:  [94 %-99 %] 94 % (12/30 1128) Weight:  [75.7 kg] 75.7 kg (12/30 0400)   Intake/Output: 12/29 0701 - 12/30 0700 In: 1266.4 [I.V.:866.4; IV Piggyback:400] Out: 800 [Urine:800] Last BM Date: 02/22/19  Physical Exam: Constitutional: No acute distress Abdomen:  Soft, non-distended, with some soreness to palpation in the right lower quadrant, non-peritoneal.  Labs:  Recent Labs    02/22/19 0539 02/23/19 0459  WBC 12.9* 14.5*  HGB 11.8* 12.1*  HCT 34.2* 34.8*  PLT 384 437*   Recent Labs    02/22/19 0539 02/23/19 0459  NA 139 139  K 3.0* 3.5  CL 100 103  CO2 28 28  GLUCOSE 97 102*  BUN 10 9  CREATININE 0.99 0.96  CALCIUM 7.1* 7.3*   Recent Labs    02/23/19 0459  LABPROT 16.4*  INR 1.3*    Imaging: No results found.  Assessment/Plan: This is a 73 y.o. male with terminal ileitis -- ibd vs ileal diverticulitis.   --Discussed case with Dr. Pascal Lux this morning.  He will bring patient down to CT for evaluation of the abscess and see if there is a window for drainage.  If so, will place percutaneous drain. --Discussed with patient that if unable to get a drain, if there continues to be worsening, he may end up requiring surgery.   Melvyn Neth, [facility]

## 2019-02-24 LAB — CBC WITH DIFFERENTIAL/PLATELET
Abs Immature Granulocytes: 0.18 10*3/uL — ABNORMAL HIGH (ref 0.00–0.07)
Basophils Absolute: 0.1 10*3/uL (ref 0.0–0.1)
Basophils Relative: 0 %
Eosinophils Absolute: 0.2 10*3/uL (ref 0.0–0.5)
Eosinophils Relative: 1 %
HCT: 33.5 % — ABNORMAL LOW (ref 39.0–52.0)
Hemoglobin: 11.6 g/dL — ABNORMAL LOW (ref 13.0–17.0)
Immature Granulocytes: 1 %
Lymphocytes Relative: 7 %
Lymphs Abs: 1 10*3/uL (ref 0.7–4.0)
MCH: 27.5 pg (ref 26.0–34.0)
MCHC: 34.6 g/dL (ref 30.0–36.0)
MCV: 79.4 fL — ABNORMAL LOW (ref 80.0–100.0)
Monocytes Absolute: 0.9 10*3/uL (ref 0.1–1.0)
Monocytes Relative: 6 %
Neutro Abs: 11.9 10*3/uL — ABNORMAL HIGH (ref 1.7–7.7)
Neutrophils Relative %: 85 %
Platelets: 440 10*3/uL — ABNORMAL HIGH (ref 150–400)
RBC: 4.22 MIL/uL (ref 4.22–5.81)
RDW: 13.7 % (ref 11.5–15.5)
WBC: 14.2 10*3/uL — ABNORMAL HIGH (ref 4.0–10.5)
nRBC: 0 % (ref 0.0–0.2)

## 2019-02-24 LAB — BASIC METABOLIC PANEL
Anion gap: 9 (ref 5–15)
BUN: 8 mg/dL (ref 8–23)
CO2: 27 mmol/L (ref 22–32)
Calcium: 7.2 mg/dL — ABNORMAL LOW (ref 8.9–10.3)
Chloride: 101 mmol/L (ref 98–111)
Creatinine, Ser: 0.78 mg/dL (ref 0.61–1.24)
GFR calc Af Amer: >60 mL/min (ref >60)
GFR calc non Af Amer: >60 mL/min (ref >60)
Glucose, Bld: 117 mg/dL — ABNORMAL HIGH (ref 70–99)
Potassium: 3.6 mmol/L (ref 3.5–5.1)
Sodium: 137 mmol/L (ref 135–145)

## 2019-02-24 MED ORDER — SODIUM CHLORIDE 0.9 % IV SOLN
INTRAVENOUS | Status: DC | PRN
  Administered 2019-02-24 (×2): 30 mL via INTRAVENOUS
  Administered 2019-02-25 – 2019-02-26 (×4): 50 mL via INTRAVENOUS
  Administered 2019-02-28: 250 mL via INTRAVENOUS

## 2019-02-24 NOTE — Progress Notes (Signed)
Pharmacy Antibiotic Note  Todd Briggs is a 73 y.o. male admitted on 02/21/2019 with IAI (terminal ileitis with microabscesses).  Pharmacy has been consulted for cefepime dosing. Also on Flagyl. Status post IR guided aspiration and a drainage placement on 02/23/2019 with culture pending. Leukocytosis somewhat fluctuating but less than admission; no fevers. This is day #4 of IV antibiotics. His renal function is stable.  Plan: Continue cefepime 2 g IV q8h    Height: 5' 10"  (177.8 cm) Weight: 166 lb 14.2 oz (75.7 kg) IBW/kg (Calculated) : 73  Temp (24hrs), Avg:98.1 F (36.7 C), Min:97.8 F (36.6 C), Max:98.4 F (36.9 C)  Recent Labs  Lab 02/21/19 0900 02/22/19 0539 02/23/19 0459 02/24/19 0453  WBC 16.4* 12.9* 14.5* 14.2*  CREATININE 0.98 0.99 0.96 0.78    Estimated Creatinine Clearance: 82.4 mL/min (by C-G formula based on SCr of 0.78 mg/dL).    Allergies  Allergen Reactions  . Penicillins Rash    Occurred as a child    Antimicrobials this admission: Cefepime/Flagyl 12/28 >>  Microbiology results: 12/30 BCx: NGTD 12/30 WCx: mod GPR, few GPC in chains, GNR 12/28 SARS CoV-2: negative 12/28 GI panel negative 12/28 C diff: negative  Thank you for allowing pharmacy to be a part of this patient's care.  Dallie Piles 02/24/2019 3:00 PM

## 2019-02-24 NOTE — Progress Notes (Signed)
Progress Note    Todd Briggs  HYQ:[id number] DOB: Jan 19, 1946  DOA: 02/21/2019 PCP: Sofie Hartigan, MD      Brief Narrative:    Medical records reviewed and are as summarized below:   Todd Briggs is an 73 y.o. 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  At that time, he was discharged on ciprofloxacin and Flagyl.  He presented to the hospital today because of worsening diarrhea, generalized weakness and abdominal pain.  He describes the pain as "gas pain".  It was quite severe.  It was nonradiating and there was no known relieving or aggravating factors.  Pain was mainly located in the mid abdomen and right lower quadrant area.  He said he had loose watery stools almost every 30 minutes the day before admission.  His symptoms were associated with abdominal bloating. Stools are nonbloody.  He did not have any fever, chills, nausea, vomiting.  ED Course:  The patient had a CT scan of the abdomen and pelvis which showed persistent changes likely related to inflammatory bowel disease within the distal ileum with interval development of multiple air-fluid collections/abscesses within the right mid and central abdomen.  He was given IV antibiotics, oral and IV potassium and IV fluids in the ED.  He was seen in consultation by the general surgeon who recommended conservative management.    Assessment/Plan:   Principal Problem:   Ileitis, terminal, with abscess ([place]) Active Problems:   Hypokalemia   Diarrhea   Rupture of bowel (HCC)   Body mass index is 23.95 kg/m.    Terminal ileitis with microabscesses:  -Status post IR guided aspiration and a drainage placement on 02/23/2019.   -We'll having some mild abdominal discomfort -Possible early  ileus -Continue clear liquid diet as per surgery recommendation -appreciate interventional radiology, general surgery and GI recommendations -  Continue IV fluids and empiric IV antibiotics  -To follow blood culture and drainage culture -GI panel negative for C. difficile -  Analgesics as needed for pain -Leukocytosis somewhat fluctuating but less than admission; no fevers -Close monitoring  Hypokalemia: Resolved  -Monitor daily  Bilateral pleural effusions with right basilar atelectasis on CT abdomen pelvis: Incidental finding and no acute issues.   - He is tolerating room air -Continue to monitor  Right renal mass: He has a follow-up appointment with urologist.  History of IBS/chronic diarrhea --symptomatic management at this point   Family Communication/Anticipated D/C date and plan/Code Status   DVT prophylaxis: Lovenox Code Status: Full code Family Communication: Discussed with the patient  disposition Plan: To be determined     Subjective:   Patient was somewhat exhausted this morning was unable to sleep overnight.  Status post IR guided procedure.  Did not have any hematochezia or hematemesis.  He does not have much abdominal pain.  No vomiting, fever or chills.  Objective:    Vitals:   02/23/19 1105 02/23/19 1128 02/23/19 2148 02/24/19 0528  BP:  (!) 142/78 (!) 160/92 (!) 149/76  Pulse: 80 73 78 69  Resp: 18 18 16 20   Temp:  97.9 F (36.6 C) 98.4 F (36.9 C) 97.8 F (36.6 C)  TempSrc:  Oral Oral Oral  SpO2: 97% 94% 97% 95%  Weight:      Height:        Intake/Output Summary (Last  24 hours) at 02/24/2019 1351 Last data filed at 02/24/2019 1251 Gross per 24 hour  Intake 840 ml  Output 30 ml  Net 810 ml   Filed Weights   02/21/19 0846 02/21/19 0854 02/23/19 0400  Weight: 74.8 kg 74.8 kg 75.7 kg    Exam:  GEN: NAD SKIN: Diffuse erythematous maculopapular rash on his back EYES: No pallor or icterus  ENT: MMM CV: RRR PULM: CTA B ABD: soft,  ND, NT, +BS; no guarding or rebound tenderness CNS: AAO x 3, non focal EXT: No edema or tenderness   Data Reviewed:   I have personally reviewed following labs and imaging studies:  Labs: Labs show the following:   Basic Metabolic Panel: Recent Labs  Lab 02/21/19 0900 02/22/19 0539 02/23/19 0459 02/24/19 0453  NA 135 139 139 137  K 2.8* 3.0* 3.5 3.6  CL 97* 100 103 101  CO2 27 28 28 27   GLUCOSE 123* 97 102* 117*  BUN 13 10 9 8   CREATININE 0.98 0.99 0.96 0.78  CALCIUM 7.5* 7.1* 7.3* 7.2*  MG 1.9 2.0  --   --   PHOS 3.2  --   --   --    GFR Estimated Creatinine Clearance: 82.4 mL/min (by C-G formula based on SCr of 0.78 mg/dL). Liver Function Tests: Recent Labs  Lab 02/21/19 0900  AST 32  ALT 22  ALKPHOS 106  BILITOT 0.8  PROT 5.6*  ALBUMIN 2.4*   Recent Labs  Lab 02/21/19 0900  LIPASE 33   No results for input(s): AMMONIA in the last 168 hours. Coagulation profile Recent Labs  Lab 02/23/19 0459  INR 1.3*    CBC: Recent Labs  Lab 02/21/19 0900 02/22/19 0539 02/23/19 0459 02/24/19 0453  WBC 16.4* 12.9* 14.5* 14.2*  NEUTROABS  --   --   --  11.9*  HGB 12.4* 11.8* 12.1* 11.6*  HCT 36.8* 34.2* 34.8* 33.5*  MCV 81.8 79.5* 80.4 79.4*  PLT 380 384 437* 440*   Cardiac Enzymes: No results for input(s): CKTOTAL, CKMB, CKMBINDEX, TROPONINI in the last 168 hours. BNP (last 3 results) No results for input(s): PROBNP in the last 8760 hours. CBG: No results for input(s): GLUCAP in the last 168 hours. D-Dimer: No results for input(s): DDIMER in the last 72 hours. Hgb A1c: No results for input(s): HGBA1C in the last 72 hours. Lipid Profile: No results for input(s): CHOL, HDL, LDLCALC, TRIG, CHOLHDL, LDLDIRECT in the last 72 hours. Thyroid function studies: No results for input(s): TSH, T4TOTAL, T3FREE, THYROIDAB in the last 72 hours.  Invalid input(s): FREET3 Anemia work up: No results for input(s): VITAMINB12, FOLATE, FERRITIN, TIBC, IRON,  RETICCTPCT in the last 72 hours. Sepsis Labs: Recent Labs  Lab 02/21/19 0900 02/22/19 0539 02/23/19 0459 02/24/19 0453  WBC 16.4* 12.9* 14.5* 14.2*    Microbiology Recent Results (from the past 240 hour(s))  SARS CORONAVIRUS 2 (TAT 6-24 HRS) Nasopharyngeal Nasopharyngeal Swab     Status: None   Collection Time: 02/15/19 12:10 PM   Specimen: Nasopharyngeal Swab  Result Value Ref Range Status   SARS Coronavirus 2 NEGATIVE NEGATIVE Final    Comment: (NOTE) SARS-CoV-2 target nucleic acids are NOT DETECTED. The SARS-CoV-2 RNA is generally detectable in upper and lower respiratory specimens during the acute phase of infection. Negative results do not preclude SARS-CoV-2 infection, do not rule out co-infections with other pathogens, and should not be used as the sole basis for treatment or other patient management decisions. Negative results must be  combined with clinical observations, patient history, and epidemiological information. The expected result is Negative. Fact Sheet for Patients: SugarRoll.be Fact Sheet for Healthcare Providers: https://www.woods-mathews.com/ This test is not yet approved or cleared by the [place] FDA and  has been authorized for detection and/or diagnosis of SARS-CoV-2 by FDA under an Emergency Use Authorization (EUA). This EUA will remain  in effect (meaning this test can be used) for the duration of the COVID-19 declaration under Section 56 4(b)(1) of the Act, 21 U.S.C. section 360bbb-3(b)(1), unless the authorization is terminated or revoked sooner. Performed at [facility], [place] [address] [place], [place] [address]   CULTURE, BLOOD (ROUTINE X 2) w Reflex to ID Panel     Status: None   Collection Time: 02/15/19  1:16 PM   Specimen: BLOOD  Result Value Ref Range Status   Specimen Description BLOOD LEFT ANTECUBITAL  Final   Special Requests   Final    BOTTLES DRAWN AEROBIC AND ANAEROBIC Blood  Culture adequate volume   Culture   Final    NO GROWTH 5 DAYS Performed at [facility], [address] [place], [place] [address]    Report Status 02/20/2019 FINAL  Final  CULTURE, BLOOD (ROUTINE X 2) w Reflex to ID Panel     Status: None   Collection Time: 02/15/19  1:16 PM   Specimen: BLOOD  Result Value Ref Range Status   Specimen Description BLOOD RIGHT ANTECUBITAL  Final   Special Requests   Final    BOTTLES DRAWN AEROBIC AND ANAEROBIC Blood Culture adequate volume   Culture   Final    NO GROWTH 5 DAYS Performed at [facility], [address] [place],  [address]    Report Status 02/20/2019 FINAL  Final  GI pathogen panel by PCR, stool     Status: None   Collection Time: 02/21/19  4:48 PM   Specimen: Stool  Result Value Ref Range Status   Plesiomonas shigelloides NOT DETECTED NOT DETECTED Final   Yersinia enterocolitica NOT DETECTED NOT DETECTED Final   Vibrio NOT DETECTED NOT DETECTED Final   Enteropathogenic E coli NOT DETECTED NOT DETECTED Final   E coli (ETEC) LT/ST NOT DETECTED NOT DETECTED Final   E coli 1779 by PCR Not applicable NOT DETECTED Final   Cryptosporidium by PCR NOT DETECTED NOT DETECTED Final   Entamoeba histolytica NOT DETECTED NOT DETECTED Final   Adenovirus F 40/41 NOT DETECTED NOT DETECTED Final   Norovirus GI/GII NOT DETECTED NOT DETECTED Final   Sapovirus NOT DETECTED NOT DETECTED Final    Comment: (NOTE) Performed At: [facility] [address] [place], [place] [id number] Rush Farmer MD RA:[id number]    Vibrio cholerae NOT DETECTED NOT DETECTED Final   Campylobacter by PCR NOT DETECTED NOT DETECTED Final   Salmonella by PCR NOT DETECTED NOT DETECTED Final   E coli (STEC) NOT DETECTED NOT DETECTED Final   Enteroaggregative E coli NOT DETECTED NOT DETECTED Final   Shigella by PCR NOT DETECTED NOT DETECTED Final   Cyclospora cayetanensis NOT DETECTED NOT DETECTED Final   Astrovirus NOT DETECTED NOT  DETECTED Final   G lamblia by PCR NOT DETECTED NOT DETECTED Final   Rotavirus A by PCR NOT DETECTED NOT DETECTED Final  C Difficile Quick Screen w PCR reflex     Status: None   Collection Time: 02/21/19  4:48 PM   Specimen: Stool  Result Value Ref Range Status   C Diff antigen NEGATIVE NEGATIVE  Final   C Diff toxin NEGATIVE NEGATIVE Final   C Diff interpretation No C. difficile detected.  Final    Comment: Performed at [facility], [place], [place] [address]  SARS CORONAVIRUS 2 (TAT 6-24 HRS) Nasopharyngeal Nasopharyngeal Swab     Status: None   Collection Time: 02/21/19  6:15 PM   Specimen: Nasopharyngeal Swab  Result Value Ref Range Status   SARS Coronavirus 2 NEGATIVE NEGATIVE Final    Comment: (NOTE) SARS-CoV-2 target nucleic acids are NOT DETECTED. The SARS-CoV-2 RNA is generally detectable in upper and lower respiratory specimens during the acute phase of infection. Negative results do not preclude SARS-CoV-2 infection, do not rule out co-infections with other pathogens, and should not be used as the sole basis for treatment or other patient management decisions. Negative results must be combined with clinical observations, patient history, and epidemiological information. The expected result is Negative. Fact Sheet for Patients: SugarRoll.be Fact Sheet for Healthcare Providers: https://www.woods-mathews.com/ This test is not yet approved or cleared by the [place] FDA and  has been authorized for detection and/or diagnosis of SARS-CoV-2 by FDA under an Emergency Use Authorization (EUA). This EUA will remain  in effect (meaning this test can be used) for the duration of the COVID-19 declaration under Section 56 4(b)(1) of the Act, 21 U.S.C. section 360bbb-3(b)(1), unless the authorization is terminated or revoked sooner. Performed at [facility], [place] [address], [place], [place] [address]    Aerobic/Anaerobic Culture (surgical/deep wound)     Status: None (Preliminary result)   Collection Time: 02/23/19 11:30 AM   Specimen: Wound; Abscess  Result Value Ref Range Status   Specimen Description   Final    WOUND Performed at [facility], [address], [place], [place] [address]    Special Requests   Final    Normal Performed at [facility], [address] [place], [place] [address]    Gram Stain   Final    MODERATE WBC PRESENT, PREDOMINANTLY PMN MODERATE GRAM POSITIVE RODS FEW GRAM POSITIVE COCCI IN CHAINS RARE YEAST RARE GRAM NEGATIVE RODS    Culture   Final    NO GROWTH < 24 HOURS Performed at [facility], [place] [address], [place], [place] [address]    Report Status PENDING  Incomplete  CULTURE, BLOOD (ROUTINE X 2) w Reflex to ID Panel     Status: None (Preliminary result)   Collection Time: 02/23/19  2:14 PM   Specimen: BLOOD  Result Value Ref Range Status   Specimen Description BLOOD RIGHT ANTECUBITAL  Final   Special Requests   Final    BOTTLES DRAWN AEROBIC AND ANAEROBIC Blood Culture adequate volume   Culture   Final    NO GROWTH < 24 HOURS Performed at [facility], [address], [place], [place] [address]    Report Status PENDING  Incomplete  CULTURE, BLOOD (ROUTINE X 2) w Reflex to ID Panel     Status: None (Preliminary result)   Collection Time: 02/23/19  2:22 PM   Specimen: BLOOD  Result Value Ref Range Status   Specimen Description BLOOD BLOOD RIGHT HAND  Final   Special Requests   Final    BOTTLES DRAWN AEROBIC AND ANAEROBIC Blood Culture adequate volume   Culture   Final    NO GROWTH < 24 HOURS Performed at [facility], [address] [place], [place] [address]    Report Status PENDING  Incomplete  Procedures and diagnostic studies:  CT IMAGE GUIDED DRAINAGE BY PERCUTANEOUS CATHETER  Result Date: 02/23/2019 INDICATION: Concern for inflammatory bowel disease, now with enteric  perforation. Please perform CT-guided aspiration and/or drainage catheter placement for infection source control purposes. EXAM: CT IMAGE GUIDED DRAINAGE BY PERCUTANEOUS CATHETER COMPARISON:  CT abdomen pelvis-02/21/2019; 02/15/2019 MEDICATIONS: The patient is currently admitted to the hospital and receiving intravenous antibiotics. The antibiotics were administered within an appropriate time frame prior to the initiation of the procedure. ANESTHESIA/SEDATION: Moderate (conscious) sedation was employed during this procedure. A total of Versed 2 mg and Fentanyl 100 mcg was administered intravenously. Moderate Sedation Time: 11 minutes. The patient's level of consciousness and vital signs were monitored continuously by radiology nursing throughout the procedure under my direct supervision. CONTRAST:  None COMPLICATIONS: None immediate. PROCEDURE: Informed written consent was obtained from the patient after a discussion of the risks, benefits and alternatives to treatment. The patient was placed supine on the CT gantry and a pre procedural CT was performed re-demonstrating the known interloop abscess/fluid collection within the right lower abdomen/pelvis with dominant bilobed serpiginous component measuring approximately 6.5 x 2.3 cm (image 26, series 2). The procedure was planned. A timeout was performed prior to the initiation of the procedure. The skin overlying the anterolateral aspect of the right lower abdomen was prepped and draped in the usual sterile fashion. The overlying soft tissues were anesthetized with 1% lidocaine with epinephrine. Appropriate trajectory was planned with the use of a 22 gauge spinal needle. An 18 gauge trocar needle was advanced into the abscess/fluid collection and a short Amplatz super stiff wire was coiled within the collection. Appropriate positioning was confirmed with a limited CT scan. The tract was serially dilated allowing placement of a 10 [place] all-purpose drainage catheter.  Appropriate positioning was confirmed with a limited postprocedural CT scan. Approximately 35 ml of purulent fluid was aspirated. The tube was connected to a JP bulb and sutured in place. A dressing was placed. The patient tolerated the procedure well without immediate post procedural complication. IMPRESSION: Successful CT guided placement of a 10 French all purpose drain catheter into the interloop abscess within the right lower abdomen/pelvis with aspiration of 35 mL of purulent fluid. Samples were sent to the laboratory as requested by the ordering clinical team. Electronically Signed   By: Sandi Mariscal M.D.   On: 02/23/2019 12:30    Medications:   . enoxaparin (LOVENOX) injection  40 mg Subcutaneous Q24H  . influenza vaccine adjuvanted  0.5 mL Intramuscular Tomorrow-1000  . lidocaine  1 patch Transdermal Q24H  . loratadine  10 mg Oral Daily  . sodium chloride flush  5 mL Intracatheter Q8H   Continuous Infusions: . sodium chloride 30 mL (02/24/19 1039)  . ceFEPime (MAXIPIME) IV 2 g (02/24/19 1040)  . dextrose 5 % and 0.9 % NaCl with KCl 40 mEq/L 100 mL/hr at 02/24/19 0729  . metronidazole 500 mg (02/24/19 1200)     LOS: 3 days   [place]  [facility]   *Please refer to [email or web address], password [email or web address] to get updated schedule on who will round on this patient, as hospitalists switch teams weekly. If 7PM-7AM, please contact night-coverage at [email or web address], password [email or web address] for any overnight needs.  02/24/2019, 1:51 PM

## 2019-02-24 NOTE — Progress Notes (Signed)
Todd Antigua, MD [address] [place], [place], [place], [address] [address], [place], [place], [place], [address] Phone: [phone]  Fax: [phone]   Subjective: Patient reports 3-4 loose bowel movements yesterday.  No blood in stool.  This is improved compared to 10 loose bowel movements a day, previously   Objective: Exam: Vital signs in last 24 hours: Vitals:   02/23/19 1105 02/23/19 1128 02/23/19 2148 02/24/19 0528  BP:  (!) 142/78 (!) 160/92 (!) 149/76  Pulse: 80 73 78 69  Resp: 18 18 16 20   Temp:  97.9 F (36.6 C) 98.4 F (36.9 C) 97.8 F (36.6 C)  TempSrc:  Oral Oral Oral  SpO2: 97% 94% 97% 95%  Weight:      Height:       Weight change:   Intake/Output Summary (Last 24 hours) at 02/24/2019 1434 Last data filed at 02/24/2019 1426 Gross per 24 hour  Intake 845 ml  Output 30 ml  Net 815 ml    General: No acute distress, AAO x3 Abd: Soft, NT/ND, No HSM Skin: Warm, no rashes Neck: Supple, Trachea midline   Lab Results: Lab Results  Component Value Date   WBC 14.2 (H) 02/24/2019   HGB 11.6 (L) 02/24/2019   HCT 33.5 (L) 02/24/2019   MCV 79.4 (L) 02/24/2019   PLT 440 (H) 02/24/2019   Micro Results: Recent Results (from the past 240 hour(s))  SARS CORONAVIRUS 2 (TAT 6-24 HRS) Nasopharyngeal Nasopharyngeal Swab     Status: None   Collection Time: 02/15/19 12:10 PM   Specimen: Nasopharyngeal Swab  Result Value Ref Range Status   SARS Coronavirus 2 NEGATIVE NEGATIVE Final    Comment: (NOTE) SARS-CoV-2 target nucleic acids are NOT DETECTED. The SARS-CoV-2 RNA is generally detectable in upper and lower respiratory specimens during the acute phase of infection. Negative results do not preclude SARS-CoV-2 infection, do not rule out co-infections with other pathogens, and should not be used as the sole basis for treatment or other patient management decisions. Negative results must be combined with clinical observations, patient history,  and epidemiological information. The expected result is Negative. Fact Sheet for Patients: SugarRoll.be Fact Sheet for Healthcare Providers: https://www.woods-mathews.com/ This test is not yet approved or cleared by the [place] FDA and  has been authorized for detection and/or diagnosis of SARS-CoV-2 by FDA under an Emergency Use Authorization (EUA). This EUA will remain  in effect (meaning this test can be used) for the duration of the COVID-19 declaration under Section 56 4(b)(1) of the Act, 21 U.S.C. section 360bbb-3(b)(1), unless the authorization is terminated or revoked sooner. Performed at [facility], [place] [address], [place], [place] [address]   CULTURE, BLOOD (ROUTINE X 2) w Reflex to ID Panel     Status: None   Collection Time: 02/15/19  1:16 PM   Specimen: BLOOD  Result Value Ref Range Status   Specimen Description BLOOD LEFT ANTECUBITAL  Final   Special Requests   Final    BOTTLES DRAWN AEROBIC AND ANAEROBIC Blood Culture adequate volume   Culture   Final    NO GROWTH 5 DAYS Performed at [facility], [address] [place], [place] [address]    Report Status 02/20/2019 FINAL  Final  CULTURE, BLOOD (ROUTINE X 2) w Reflex to ID Panel     Status: None   Collection Time: 02/15/19  1:16 PM   Specimen: BLOOD  Result Value Ref Range Status   Specimen Description BLOOD RIGHT  ANTECUBITAL  Final   Special Requests   Final    BOTTLES DRAWN AEROBIC AND ANAEROBIC Blood Culture adequate volume   Culture   Final    NO GROWTH 5 DAYS Performed at [facility], [address] [place], [place] [address]    Report Status 02/20/2019 FINAL  Final  GI pathogen panel by PCR, stool     Status: None   Collection Time: 02/21/19  4:48 PM   Specimen: Stool  Result Value Ref Range Status   Plesiomonas shigelloides NOT DETECTED NOT DETECTED Final   Yersinia enterocolitica NOT DETECTED NOT DETECTED Final   Vibrio  NOT DETECTED NOT DETECTED Final   Enteropathogenic E coli NOT DETECTED NOT DETECTED Final   E coli (ETEC) LT/ST NOT DETECTED NOT DETECTED Final   E coli 5631 by PCR Not applicable NOT DETECTED Final   Cryptosporidium by PCR NOT DETECTED NOT DETECTED Final   Entamoeba histolytica NOT DETECTED NOT DETECTED Final   Adenovirus F 40/41 NOT DETECTED NOT DETECTED Final   Norovirus GI/GII NOT DETECTED NOT DETECTED Final   Sapovirus NOT DETECTED NOT DETECTED Final    Comment: (NOTE) Performed At: [facility] [place], [place] [id number] Rush Farmer MD HY:[id number]    Vibrio cholerae NOT DETECTED NOT DETECTED Final   Campylobacter by PCR NOT DETECTED NOT DETECTED Final   Salmonella by PCR NOT DETECTED NOT DETECTED Final   E coli (STEC) NOT DETECTED NOT DETECTED Final   Enteroaggregative E coli NOT DETECTED NOT DETECTED Final   Shigella by PCR NOT DETECTED NOT DETECTED Final   Cyclospora cayetanensis NOT DETECTED NOT DETECTED Final   Astrovirus NOT DETECTED NOT DETECTED Final   G lamblia by PCR NOT DETECTED NOT DETECTED Final   Rotavirus A by PCR NOT DETECTED NOT DETECTED Final  C Difficile Quick Screen w PCR reflex     Status: None   Collection Time: 02/21/19  4:48 PM   Specimen: Stool  Result Value Ref Range Status   C Diff antigen NEGATIVE NEGATIVE Final   C Diff toxin NEGATIVE NEGATIVE Final   C Diff interpretation No C. difficile detected.  Final    Comment: Performed at [facility], [place], [place] [address]  SARS CORONAVIRUS 2 (TAT 6-24 HRS) Nasopharyngeal Nasopharyngeal Swab     Status: None   Collection Time: 02/21/19  6:15 PM   Specimen: Nasopharyngeal Swab  Result Value Ref Range Status   SARS Coronavirus 2 NEGATIVE NEGATIVE Final    Comment: (NOTE) SARS-CoV-2 target nucleic acids are NOT DETECTED. The SARS-CoV-2 RNA is generally detectable in upper and lower respiratory specimens during the acute phase of infection.  Negative results do not preclude SARS-CoV-2 infection, do not rule out co-infections with other pathogens, and should not be used as the sole basis for treatment or other patient management decisions. Negative results must be combined with clinical observations, patient history, and epidemiological information. The expected result is Negative. Fact Sheet for Patients: SugarRoll.be Fact Sheet for Healthcare Providers: https://www.woods-mathews.com/ This test is not yet approved or cleared by the [place] FDA and  has been authorized for detection and/or diagnosis of SARS-CoV-2 by FDA under an Emergency Use Authorization (EUA). This EUA will remain  in effect (meaning this test can be used) for the duration of the COVID-19 declaration under Section 56 4(b)(1) of the Act, 21 U.S.C. section 360bbb-3(b)(1), unless the authorization is terminated or revoked sooner. Performed at [facility], [place]  [address] [place], [place] [address]   Aerobic/Anaerobic Culture (surgical/deep wound)     Status: None (Preliminary result)   Collection Time: 02/23/19 11:30 AM   Specimen: Wound; Abscess  Result Value Ref Range Status   Specimen Description   Final    WOUND Performed at [facility], [address] [place], [place] [address]    Special Requests   Final    Normal Performed at [facility], [address] [place], [place] [address]    Gram Stain   Final    MODERATE WBC PRESENT, PREDOMINANTLY PMN MODERATE GRAM POSITIVE RODS FEW GRAM POSITIVE COCCI IN CHAINS RARE YEAST RARE GRAM NEGATIVE RODS    Culture   Final    NO GROWTH < 24 HOURS Performed at [facility], [place] [address], [place], [place] [address]    Report Status PENDING  Incomplete  CULTURE, BLOOD (ROUTINE X 2) w Reflex to ID Panel     Status: None (Preliminary result)   Collection Time: 02/23/19  2:14 PM   Specimen: BLOOD  Result Value Ref Range  Status   Specimen Description BLOOD RIGHT ANTECUBITAL  Final   Special Requests   Final    BOTTLES DRAWN AEROBIC AND ANAEROBIC Blood Culture adequate volume   Culture   Final    NO GROWTH < 24 HOURS Performed at [facility], [address], [place], [place] [address]    Report Status PENDING  Incomplete  CULTURE, BLOOD (ROUTINE X 2) w Reflex to ID Panel     Status: None (Preliminary result)   Collection Time: 02/23/19  2:22 PM   Specimen: BLOOD  Result Value Ref Range Status   Specimen Description BLOOD BLOOD RIGHT HAND  Final   Special Requests   Final    BOTTLES DRAWN AEROBIC AND ANAEROBIC Blood Culture adequate volume   Culture   Final    NO GROWTH < 24 HOURS Performed at [facility], [address] [place], [place] [address]    Report Status PENDING  Incomplete   Studies/Results: CT IMAGE GUIDED DRAINAGE BY PERCUTANEOUS CATHETER  Result Date: 02/23/2019 INDICATION: Concern for inflammatory bowel disease, now with enteric perforation. Please perform CT-guided aspiration and/or drainage catheter placement for infection source control purposes. EXAM: CT IMAGE GUIDED DRAINAGE BY PERCUTANEOUS CATHETER COMPARISON:  CT abdomen pelvis-02/21/2019; 02/15/2019 MEDICATIONS: The patient is currently admitted to the hospital and receiving intravenous antibiotics. The antibiotics were administered within an appropriate time frame prior to the initiation of the procedure. ANESTHESIA/SEDATION: Moderate (conscious) sedation was employed during this procedure. A total of Versed 2 mg and Fentanyl 100 mcg was administered intravenously. Moderate Sedation Time: 11 minutes. The patient's level of consciousness and vital signs were monitored continuously by radiology nursing throughout the procedure under my direct supervision. CONTRAST:  None COMPLICATIONS: None immediate. PROCEDURE: Informed written consent was obtained from the patient after a discussion of the risks, benefits and  alternatives to treatment. The patient was placed supine on the CT gantry and a pre procedural CT was performed re-demonstrating the known interloop abscess/fluid collection within the right lower abdomen/pelvis with dominant bilobed serpiginous component measuring approximately 6.5 x 2.3 cm (image 26, series 2). The procedure was planned. A timeout was performed prior to the initiation of the procedure. The skin overlying the anterolateral aspect of the right lower abdomen was prepped and draped in the usual sterile fashion. The overlying soft tissues were anesthetized with 1% lidocaine with epinephrine. Appropriate trajectory was planned with  the use of a 22 gauge spinal needle. An 18 gauge trocar needle was advanced into the abscess/fluid collection and a short Amplatz super stiff wire was coiled within the collection. Appropriate positioning was confirmed with a limited CT scan. The tract was serially dilated allowing placement of a 10 [place] all-purpose drainage catheter. Appropriate positioning was confirmed with a limited postprocedural CT scan. Approximately 35 ml of purulent fluid was aspirated. The tube was connected to a JP bulb and sutured in place. A dressing was placed. The patient tolerated the procedure well without immediate post procedural complication. IMPRESSION: Successful CT guided placement of a 10 French all purpose drain catheter into the interloop abscess within the right lower abdomen/pelvis with aspiration of 35 mL of purulent fluid. Samples were sent to the laboratory as requested by the ordering clinical team. Electronically Signed   By: Sandi Mariscal M.D.   On: 02/23/2019 12:30   Medications:  Scheduled Meds: . enoxaparin (LOVENOX) injection  40 mg Subcutaneous Q24H  . influenza vaccine adjuvanted  0.5 mL Intramuscular Tomorrow-1000  . lidocaine  1 patch Transdermal Q24H  . loratadine  10 mg Oral Daily  . sodium chloride flush  5 mL Intracatheter Q8H   Continuous Infusions: .  sodium chloride 30 mL (02/24/19 1039)  . ceFEPime (MAXIPIME) IV 2 g (02/24/19 1040)  . dextrose 5 % and 0.9 % NaCl with KCl 40 mEq/L 100 mL/hr at 02/24/19 1425  . metronidazole 500 mg (02/24/19 1200)   PRN Meds:.sodium chloride, acetaminophen **OR** [DISCONTINUED] acetaminophen, HYDROcodone-acetaminophen, hydrocortisone cream, ondansetron **OR** ondansetron (ZOFRAN) IV, traZODone   Assessment: Principal Problem:   Ileitis, terminal, with abscess ([place]) Active Problems:   Hypokalemia   Diarrhea   Rupture of bowel ([place])    Plan: IR drain in place Continue antibiotics Possible colonoscopy in the near future, after improvement of the infection currently being treated with drainage and antibiotics  Dr. Alice Reichert is on call over the weekend and will be following the patient   LOS: 3 days   Todd Antigua, MD 02/24/2019, 2:34 PM

## 2019-02-24 NOTE — Care Management Important Message (Signed)
Important Message  Patient Details  Name: Todd Briggs MRN: [id number] Date of Birth: [birth year]   Medicare Important Message Given:  Yes     Dannette Barbara 02/24/2019, 11:19 AM

## 2019-02-24 NOTE — Progress Notes (Signed)
02/24/2019  Subjective: Patient reports not feeling well this morning.  He mentions that after drinking he felt very bloated with some abdominal discomfort.  His pain is overall doing well, but he doesn't feel as good.  Vital signs: Temp:  [97.8 F (36.6 C)-98.4 F (36.9 C)] 97.8 F (36.6 C) (12/31 0528) Pulse Rate:  [69-80] 69 (12/31 0528) Resp:  [12-20] 20 (12/31 0528) BP: (131-438)/(70-92) 149/76 (12/31 0528) SpO2:  [94 %-99 %] 95 % (12/31 0528)   Intake/Output: 12/30 0701 - 12/31 0700 In: -  Out: 30 [Drains:30] Last BM Date: 02/22/19  Physical Exam: Constitutional: No acute distress Abdomen:  Soft, non-distended, with some discomfort in RLQ, not worse than prior.  Percutaneous drain in place with seropurulent fluid.  Labs:  Recent Labs    02/23/19 0459 02/24/19 0453  WBC 14.5* 14.2*  HGB 12.1* 11.6*  HCT 34.8* 33.5*  PLT 437* 440*   Recent Labs    02/23/19 0459 02/24/19 0453  NA 139 137  K 3.5 3.6  CL 103 101  CO2 28 27  GLUCOSE 102* 117*  BUN 9 8  CREATININE 0.96 0.78  CALCIUM 7.3* 7.2*   Recent Labs    02/23/19 0459  LABPROT 16.4*  INR 1.3*    Imaging: CT IMAGE GUIDED DRAINAGE BY PERCUTANEOUS CATHETER  Result Date: 02/23/2019 INDICATION: Concern for inflammatory bowel disease, now with enteric perforation. Please perform CT-guided aspiration and/or drainage catheter placement for infection source control purposes. EXAM: CT IMAGE GUIDED DRAINAGE BY PERCUTANEOUS CATHETER COMPARISON:  CT abdomen pelvis-02/21/2019; 02/15/2019 MEDICATIONS: The patient is currently admitted to the hospital and receiving intravenous antibiotics. The antibiotics were administered within an appropriate time frame prior to the initiation of the procedure. ANESTHESIA/SEDATION: Moderate (conscious) sedation was employed during this procedure. A total of Versed 2 mg and Fentanyl 100 mcg was administered intravenously. Moderate Sedation Time: 11 minutes. The patient's level of  consciousness and vital signs were monitored continuously by radiology nursing throughout the procedure under my direct supervision. CONTRAST:  None COMPLICATIONS: None immediate. PROCEDURE: Informed written consent was obtained from the patient after a discussion of the risks, benefits and alternatives to treatment. The patient was placed supine on the CT gantry and a pre procedural CT was performed re-demonstrating the known interloop abscess/fluid collection within the right lower abdomen/pelvis with dominant bilobed serpiginous component measuring approximately 6.5 x 2.3 cm (image 26, series 2). The procedure was planned. A timeout was performed prior to the initiation of the procedure. The skin overlying the anterolateral aspect of the right lower abdomen was prepped and draped in the usual sterile fashion. The overlying soft tissues were anesthetized with 1% lidocaine with epinephrine. Appropriate trajectory was planned with the use of a 22 gauge spinal needle. An 18 gauge trocar needle was advanced into the abscess/fluid collection and a short Amplatz super stiff wire was coiled within the collection. Appropriate positioning was confirmed with a limited CT scan. The tract was serially dilated allowing placement of a 10 [place] all-purpose drainage catheter. Appropriate positioning was confirmed with a limited postprocedural CT scan. Approximately 35 ml of purulent fluid was aspirated. The tube was connected to a JP bulb and sutured in place. A dressing was placed. The patient tolerated the procedure well without immediate post procedural complication. IMPRESSION: Successful CT guided placement of a 10 French all purpose drain catheter into the interloop abscess within the right lower abdomen/pelvis with aspiration of 35 mL of purulent fluid. Samples were sent to the laboratory as requested  by the ordering clinical team. Electronically Signed   By: Sandi Mariscal M.D.   On: 02/23/2019 12:30     Assessment/Plan: This is a 73 y.o. male with terminal ileitis vs ileal diverticulitis, with abscess, s/p percutaneous drainage.  --At this point, the patient may be having a degree of ileus that could be causing the bloatedness or discomfort after drinking.  Will keep on clears today and not advance until feeling better. --Continue IV antibiotics.  Awaiting cultures from drainage.   Melvyn Neth, [facility]

## 2019-02-25 HISTORY — PX: OTHER SURGICAL HISTORY: SHX169

## 2019-02-25 LAB — CBC WITH DIFFERENTIAL/PLATELET
Abs Immature Granulocytes: 0.19 10*3/uL — ABNORMAL HIGH (ref 0.00–0.07)
Basophils Absolute: 0.1 10*3/uL (ref 0.0–0.1)
Basophils Relative: 0 %
Eosinophils Absolute: 0.2 10*3/uL (ref 0.0–0.5)
Eosinophils Relative: 2 %
HCT: 35.1 % — ABNORMAL LOW (ref 39.0–52.0)
Hemoglobin: 12.2 g/dL — ABNORMAL LOW (ref 13.0–17.0)
Immature Granulocytes: 2 %
Lymphocytes Relative: 10 %
Lymphs Abs: 1.2 10*3/uL (ref 0.7–4.0)
MCH: 27.5 pg (ref 26.0–34.0)
MCHC: 34.8 g/dL (ref 30.0–36.0)
MCV: 79.2 fL — ABNORMAL LOW (ref 80.0–100.0)
Monocytes Absolute: 0.6 10*3/uL (ref 0.1–1.0)
Monocytes Relative: 5 %
Neutro Abs: 10 10*3/uL — ABNORMAL HIGH (ref 1.7–7.7)
Neutrophils Relative %: 81 %
Platelets: 515 10*3/uL — ABNORMAL HIGH (ref 150–400)
RBC: 4.43 MIL/uL (ref 4.22–5.81)
RDW: 13.3 % (ref 11.5–15.5)
WBC: 12.3 10*3/uL — ABNORMAL HIGH (ref 4.0–10.5)
nRBC: 0 % (ref 0.0–0.2)

## 2019-02-25 LAB — BASIC METABOLIC PANEL
Anion gap: 10 (ref 5–15)
BUN: 6 mg/dL — ABNORMAL LOW (ref 8–23)
CO2: 26 mmol/L (ref 22–32)
Calcium: 7.4 mg/dL — ABNORMAL LOW (ref 8.9–10.3)
Chloride: 102 mmol/L (ref 98–111)
Creatinine, Ser: 0.7 mg/dL (ref 0.61–1.24)
GFR calc Af Amer: >60 mL/min (ref >60)
GFR calc non Af Amer: >60 mL/min (ref >60)
Glucose, Bld: 102 mg/dL — ABNORMAL HIGH (ref 70–99)
Potassium: 3.6 mmol/L (ref 3.5–5.1)
Sodium: 138 mmol/L (ref 135–145)

## 2019-02-25 MED ORDER — NON FORMULARY: 5.0000 mg | Freq: Every evening | Status: DC | PRN

## 2019-02-25 MED ORDER — MELATONIN 5 MG PO TABS
5.0000 mg | ORAL_TABLET | Freq: Every evening | ORAL | Status: DC | PRN
  Administered 2019-02-25 – 2019-02-27 (×3): 5 mg via ORAL
  Filled 2019-02-25 (×4): qty 1

## 2019-02-25 NOTE — Progress Notes (Signed)
02/25/2019  Subjective: Patient reports not feeling much improved this morning.  Though complains of not being able to get enough rest.  Unfortunately, I woke him from sleep.  He apparently does not want to progress much other than attempting sips of clear liquids today.    Vital signs: Temp:  [97.7 F (36.5 C)-98.3 F (36.8 C)] 97.7 F (36.5 C) (01/01 0448) Pulse Rate:  [72-76] 76 (01/01 0448) Resp:  [20] 20 (01/01 0448) BP: (148-160)/(80-87) 153/87 (01/01 0448) SpO2:  [95 %-97 %] 97 % (01/01 0448)   Intake/Output: 12/31 0701 - 01/01 0700 In: 0312.8 [P.O.:840; I.V.:4286.8; IV Piggyback:1750] Out: -  Last BM Date: 02/24/19  Physical Exam: Constitutional: No acute distress Abdomen:  Soft, non-distended, with some discomfort in RLQ, as expected.  Percutaneous drain in place with seropurulent fluid.  Labs:  Recent Labs    02/24/19 0453 02/25/19 0506  WBC 14.2* 12.3*  HGB 11.6* 12.2*  HCT 33.5* 35.1*  PLT 440* 515*   Recent Labs    02/24/19 0453 02/25/19 0506  NA 137 138  K 3.6 3.6  CL 101 102  CO2 27 26  GLUCOSE 117* 102*  BUN 8 6*  CREATININE 0.78 0.70  CALCIUM 7.2* 7.4*   Recent Labs    02/23/19 0459  LABPROT 16.4*  INR 1.3*    Imaging: No new imaging since 12/30.   Assessment/Plan: This is a 74 y.o. male with terminal ileitis vs ileal diverticulitis, with abscess, s/p percutaneous drainage.  --At this point, the patient may be having a degree of ileus, will keep on clears today and not advance until feeling better. --Continue IV antibiotics.  Awaiting cultures from drainage.  Todd Briggs 12:27 PM

## 2019-02-25 NOTE — Progress Notes (Signed)
PROGRESS NOTE    Todd Briggs  MOQ:[id number] DOB: 06-05-1945 DOA: 02/21/2019 PCP: Sofie Hartigan, MD      Assessment & Plan:   Principal Problem:   Ileitis, terminal, with abscess [facility]) Active Problems:   Hypokalemia   Diarrhea   Rupture of bowel ([place])   Terminal ileitis with microabscesses: continue IV cefepime & flagyl. S/p IR guided aspiration and a drainage placement on 02/23/2019. Wound cx growing moderate gram positive rods, few gram positive cocci in chains, rare yeast, rare gram neg rods. GI panel neg for c. diff. GI and gen surg following and recs apprec  Hypokalemia: WNL today. Will continue to monitor   Bilateral pleural effusions:  Asymptomatic. Not requiring supplemental oxygen  Right renal mass: pt has a follow-up appointment with urologist.  Leukocytosis: likely secondary to infection. Continue on IV abxs  Thrombocytosis: likely reactive. Will continue to monitor   Microcytic anemia: will order iron panel. No need for a transfusion at this time   DVT prophylaxis: lovenox Code Status: full  Family Communication:  Disposition Plan:    Consultants:   GI, gen surg   Procedures:  S/p IR guided aspiration and a drainage placement on 02/23/2019.   Antimicrobials: cefepime & flagyl   Subjective: Pt c/o abd pain   Objective: Vitals:   02/24/19 0528 02/24/19 1506 02/24/19 2138 02/25/19 0448  BP: (!) 149/76 (!) 148/82 (!) 160/80 (!) 153/87  Pulse: 69 72 72 76  Resp: 20  20 20   Temp: 97.8 F (36.6 C) 98 F (36.7 C) 98.3 F (36.8 C) 97.7 F (36.5 C)  TempSrc: Oral Oral    SpO2: 95% 95% 95% 97%  Weight:      Height:        Intake/Output Summary (Last 24 hours) at 02/25/2019 0816 Last data filed at 02/25/2019 0500 Gross per 24 hour  Intake 6876.77 ml  Output --  Net 6876.77 ml   Filed Weights   02/21/19 0846 02/21/19 0854 02/23/19 0400  Weight: 74.8 kg 74.8 kg 75.7 kg    Examination:  General exam: Appears calm and  comfortable  Respiratory system: Clear to auscultation. Respiratory effort normal. Cardiovascular system: S1 & S2+. No rubs, gallops or clicks.  Gastrointestinal system: Abdomen is nondistended, soft and tenderness to palpation. Hypoactive bowel sounds heard. Central nervous system: Alert and oriented. Moves all 4 extremities Psychiatry: Judgement and insight appear normal. Flat mood and affect    Data Reviewed: I have personally reviewed following labs and imaging studies  CBC: Recent Labs  Lab 02/21/19 0900 02/22/19 0539 02/23/19 0459 02/24/19 0453 02/25/19 0506  WBC 16.4* 12.9* 14.5* 14.2* 12.3*  NEUTROABS  --   --   --  11.9* 10.0*  HGB 12.4* 11.8* 12.1* 11.6* 12.2*  HCT 36.8* 34.2* 34.8* 33.5* 35.1*  MCV 81.8 79.5* 80.4 79.4* 79.2*  PLT 380 384 437* 440* 354*   Basic Metabolic Panel: Recent Labs  Lab 02/21/19 0900 02/22/19 0539 02/23/19 0459 02/24/19 0453 02/25/19 0506  NA 135 139 139 137 138  K 2.8* 3.0* 3.5 3.6 3.6  CL 97* 100 103 101 102  CO2 27 28 28 27 26   GLUCOSE 123* 97 102* 117* 102*  BUN 13 10 9 8  6*  CREATININE 0.98 0.99 0.96 0.78 0.70  CALCIUM 7.5* 7.1* 7.3* 7.2* 7.4*  MG 1.9 2.0  --   --   --   PHOS 3.2  --   --   --   --    GFR:  Estimated Creatinine Clearance: 82.4 mL/min (by C-G formula based on SCr of 0.7 mg/dL). Liver Function Tests: Recent Labs  Lab 02/21/19 0900  AST 32  ALT 22  ALKPHOS 106  BILITOT 0.8  PROT 5.6*  ALBUMIN 2.4*   Recent Labs  Lab 02/21/19 0900  LIPASE 33   No results for input(s): AMMONIA in the last 168 hours. Coagulation Profile: Recent Labs  Lab 02/23/19 0459  INR 1.3*   Cardiac Enzymes: No results for input(s): CKTOTAL, CKMB, CKMBINDEX, TROPONINI in the last 168 hours. BNP (last 3 results) No results for input(s): PROBNP in the last 8760 hours. HbA1C: No results for input(s): HGBA1C in the last 72 hours. CBG: No results for input(s): GLUCAP in the last 168 hours. Lipid Profile: No results for  input(s): CHOL, HDL, LDLCALC, TRIG, CHOLHDL, LDLDIRECT in the last 72 hours. Thyroid Function Tests: No results for input(s): TSH, T4TOTAL, FREET4, T3FREE, THYROIDAB in the last 72 hours. Anemia Panel: No results for input(s): VITAMINB12, FOLATE, FERRITIN, TIBC, IRON, RETICCTPCT in the last 72 hours. Sepsis Labs: No results for input(s): PROCALCITON, LATICACIDVEN in the last 168 hours.  Recent Results (from the past 240 hour(s))  SARS CORONAVIRUS 2 (TAT 6-24 HRS) Nasopharyngeal Nasopharyngeal Swab     Status: None   Collection Time: 02/15/19 12:10 PM   Specimen: Nasopharyngeal Swab  Result Value Ref Range Status   SARS Coronavirus 2 NEGATIVE NEGATIVE Final    Comment: (NOTE) SARS-CoV-2 target nucleic acids are NOT DETECTED. The SARS-CoV-2 RNA is generally detectable in upper and lower respiratory specimens during the acute phase of infection. Negative results do not preclude SARS-CoV-2 infection, do not rule out co-infections with other pathogens, and should not be used as the sole basis for treatment or other patient management decisions. Negative results must be combined with clinical observations, patient history, and epidemiological information. The expected result is Negative. Fact Sheet for Patients: SugarRoll.be Fact Sheet for Healthcare Providers: https://www.woods-mathews.com/ This test is not yet approved or cleared by the [place] FDA and  has been authorized for detection and/or diagnosis of SARS-CoV-2 by FDA under an Emergency Use Authorization (EUA). This EUA will remain  in effect (meaning this test can be used) for the duration of the COVID-19 declaration under Section 56 4(b)(1) of the Act, 21 U.S.C. section 360bbb-3(b)(1), unless the authorization is terminated or revoked sooner. Performed at [facility], [place] [address] [place], [place] [address]   CULTURE, BLOOD (ROUTINE X 2) w Reflex to ID Panel     Status:  None   Collection Time: 02/15/19  1:16 PM   Specimen: BLOOD  Result Value Ref Range Status   Specimen Description BLOOD LEFT ANTECUBITAL  Final   Special Requests   Final    BOTTLES DRAWN AEROBIC AND ANAEROBIC Blood Culture adequate volume   Culture   Final    NO GROWTH 5 DAYS Performed at [facility], [address] [place], [place] [address]    Report Status 02/20/2019 FINAL  Final  CULTURE, BLOOD (ROUTINE X 2) w Reflex to ID Panel     Status: None   Collection Time: 02/15/19  1:16 PM   Specimen: BLOOD  Result Value Ref Range Status   Specimen Description BLOOD RIGHT ANTECUBITAL  Final   Special Requests   Final    BOTTLES DRAWN AEROBIC AND ANAEROBIC Blood Culture adequate volume   Culture   Final    NO GROWTH 5 DAYS Performed at [facility], [address] [place], [place] [address]    Report Status 02/20/2019 FINAL  Final  GI pathogen panel by PCR, stool     Status: None   Collection Time: 02/21/19  4:48 PM   Specimen: Stool  Result Value Ref Range Status   Plesiomonas shigelloides NOT DETECTED NOT DETECTED Final   Yersinia enterocolitica NOT DETECTED NOT DETECTED Final   Vibrio NOT DETECTED NOT DETECTED Final   Enteropathogenic E coli NOT DETECTED NOT DETECTED Final   E coli (ETEC) LT/ST NOT DETECTED NOT DETECTED Final   E coli 5102 by PCR Not applicable NOT DETECTED Final   Cryptosporidium by PCR NOT DETECTED NOT DETECTED Final   Entamoeba histolytica NOT DETECTED NOT DETECTED Final   Adenovirus F 40/41 NOT DETECTED NOT DETECTED Final   Norovirus GI/GII NOT DETECTED NOT DETECTED Final   Sapovirus NOT DETECTED NOT DETECTED Final    Comment: (NOTE) Performed At: [facility] [place], [place] [id number] Rush Farmer MD MP:[id number]    Vibrio cholerae NOT DETECTED NOT DETECTED Final   Campylobacter by PCR NOT DETECTED NOT DETECTED Final   Salmonella by PCR NOT DETECTED NOT DETECTED Final   E coli (STEC) NOT DETECTED  NOT DETECTED Final   Enteroaggregative E coli NOT DETECTED NOT DETECTED Final   Shigella by PCR NOT DETECTED NOT DETECTED Final   Cyclospora cayetanensis NOT DETECTED NOT DETECTED Final   Astrovirus NOT DETECTED NOT DETECTED Final   G lamblia by PCR NOT DETECTED NOT DETECTED Final   Rotavirus A by PCR NOT DETECTED NOT DETECTED Final  C Difficile Quick Screen w PCR reflex     Status: None   Collection Time: 02/21/19  4:48 PM   Specimen: Stool  Result Value Ref Range Status   C Diff antigen NEGATIVE NEGATIVE Final   C Diff toxin NEGATIVE NEGATIVE Final   C Diff interpretation No C. difficile detected.  Final    Comment: Performed at [facility], [place], [place] [address]  SARS CORONAVIRUS 2 (TAT 6-24 HRS) Nasopharyngeal Nasopharyngeal Swab     Status: None   Collection Time: 02/21/19  6:15 PM   Specimen: Nasopharyngeal Swab  Result Value Ref Range Status   SARS Coronavirus 2 NEGATIVE NEGATIVE Final    Comment: (NOTE) SARS-CoV-2 target nucleic acids are NOT DETECTED. The SARS-CoV-2 RNA is generally detectable in upper and lower respiratory specimens during the acute phase of infection. Negative results do not preclude SARS-CoV-2 infection, do not rule out co-infections with other pathogens, and should not be used as the sole basis for treatment or other patient management decisions. Negative results must be combined with clinical observations, patient history, and epidemiological information. The expected result is Negative. Fact Sheet for Patients: SugarRoll.be Fact Sheet for Healthcare Providers: https://www.woods-mathews.com/ This test is not yet approved or cleared by the [place] FDA and  has been authorized for detection and/or diagnosis of SARS-CoV-2 by FDA under an Emergency Use Authorization (EUA). This EUA will remain  in effect (meaning this test can be used) for the duration of the COVID-19  declaration under Section 56 4(b)(1) of the Act, 21 U.S.C. section 360bbb-3(b)(1), unless the authorization is terminated or revoked sooner. Performed at [facility], [place] [address] [place], [place] [address]   Aerobic/Anaerobic Culture (surgical/deep wound)     Status: None (Preliminary result)   Collection Time: 02/23/19 11:30 AM   Specimen: Wound; Abscess  Result Value Ref Range Status   Specimen Description  Final    WOUND Performed at [facility], [address], [place], [place] [address]    Special Requests   Final    Normal Performed at [facility], [address] [place], [place] [address]    Gram Stain   Final    MODERATE WBC PRESENT, PREDOMINANTLY PMN MODERATE GRAM POSITIVE RODS FEW GRAM POSITIVE COCCI IN CHAINS RARE YEAST RARE GRAM NEGATIVE RODS    Culture   Final    NO GROWTH < 24 HOURS Performed at [facility], [place] [address] [place], [place] [address]    Report Status PENDING  Incomplete  CULTURE, BLOOD (ROUTINE X 2) w Reflex to ID Panel     Status: None (Preliminary result)   Collection Time: 02/23/19  2:14 PM   Specimen: BLOOD  Result Value Ref Range Status   Specimen Description BLOOD RIGHT ANTECUBITAL  Final   Special Requests   Final    BOTTLES DRAWN AEROBIC AND ANAEROBIC Blood Culture adequate volume   Culture   Final    NO GROWTH 2 DAYS Performed at [facility], [address], [place], [place] [address]    Report Status PENDING  Incomplete  CULTURE, BLOOD (ROUTINE X 2) w Reflex to ID Panel     Status: None (Preliminary result)   Collection Time: 02/23/19  2:22 PM   Specimen: BLOOD  Result Value Ref Range Status   Specimen Description BLOOD BLOOD RIGHT HAND  Final   Special Requests   Final    BOTTLES DRAWN AEROBIC AND ANAEROBIC Blood Culture adequate volume   Culture   Final    NO GROWTH 2 DAYS Performed at [facility], [address] [place],  [address]    Report Status  PENDING  Incomplete         Radiology Studies: CT IMAGE GUIDED DRAINAGE BY PERCUTANEOUS CATHETER  Result Date: 02/23/2019 INDICATION: Concern for inflammatory bowel disease, now with enteric perforation. Please perform CT-guided aspiration and/or drainage catheter placement for infection source control purposes. EXAM: CT IMAGE GUIDED DRAINAGE BY PERCUTANEOUS CATHETER COMPARISON:  CT abdomen pelvis-02/21/2019; 02/15/2019 MEDICATIONS: The patient is currently admitted to the hospital and receiving intravenous antibiotics. The antibiotics were administered within an appropriate time frame prior to the initiation of the procedure. ANESTHESIA/SEDATION: Moderate (conscious) sedation was employed during this procedure. A total of Versed 2 mg and Fentanyl 100 mcg was administered intravenously. Moderate Sedation Time: 11 minutes. The patient's level of consciousness and vital signs were monitored continuously by radiology nursing throughout the procedure under my direct supervision. CONTRAST:  None COMPLICATIONS: None immediate. PROCEDURE: Informed written consent was obtained from the patient after a discussion of the risks, benefits and alternatives to treatment. The patient was placed supine on the CT gantry and a pre procedural CT was performed re-demonstrating the known interloop abscess/fluid collection within the right lower abdomen/pelvis with dominant bilobed serpiginous component measuring approximately 6.5 x 2.3 cm (image 26, series 2). The procedure was planned. A timeout was performed prior to the initiation of the procedure. The skin overlying the anterolateral aspect of the right lower abdomen was prepped and draped in the usual sterile fashion. The overlying soft tissues were anesthetized with 1% lidocaine with epinephrine. Appropriate trajectory was planned with the use of a 22 gauge spinal needle. An 18 gauge trocar needle was advanced into the abscess/fluid collection and a short Amplatz super  stiff wire was coiled within the collection. Appropriate positioning was confirmed with a limited  CT scan. The tract was serially dilated allowing placement of a 10 [place] all-purpose drainage catheter. Appropriate positioning was confirmed with a limited postprocedural CT scan. Approximately 35 ml of purulent fluid was aspirated. The tube was connected to a JP bulb and sutured in place. A dressing was placed. The patient tolerated the procedure well without immediate post procedural complication. IMPRESSION: Successful CT guided placement of a 10 French all purpose drain catheter into the interloop abscess within the right lower abdomen/pelvis with aspiration of 35 mL of purulent fluid. Samples were sent to the laboratory as requested by the ordering clinical team. Electronically Signed   By: Sandi Mariscal M.D.   On: 02/23/2019 12:30        Scheduled Meds: . enoxaparin (LOVENOX) injection  40 mg Subcutaneous Q24H  . influenza vaccine adjuvanted  0.5 mL Intramuscular Tomorrow-1000  . lidocaine  1 patch Transdermal Q24H  . loratadine  10 mg Oral Daily  . sodium chloride flush  5 mL Intracatheter Q8H   Continuous Infusions: . sodium chloride 10 mL/hr at 02/25/19 0500  . ceFEPime (MAXIPIME) IV Stopped (02/25/19 0301)  . dextrose 5 % and 0.9 % NaCl with KCl 40 mEq/L Stopped (02/25/19 0225)  . metronidazole Stopped (02/25/19 0450)     LOS: 4 days    Time spent: 33 mins    Wyvonnia Dusky, MD [facility] Pager [phone]  If 7PM-7AM, please contact night-coverage [email or web address] Password [email or web address] 02/25/2019, 8:16 AM

## 2019-02-25 NOTE — Progress Notes (Signed)
GI Inpatient Follow-up Note  Subjective:  Patient seen in follow-up for terminal ileitis with microabscesses. He reports no acute events overnight other than not sleeping well. He is without abdominal pain, nausea, vomiting, or fevers. He reports two BMs this morning which were formed consistency and without overt hematochezia or melena.   Scheduled Inpatient Medications:  . enoxaparin (LOVENOX) injection  40 mg Subcutaneous Q24H  . influenza vaccine adjuvanted  0.5 mL Intramuscular Tomorrow-1000  . lidocaine  1 patch Transdermal Q24H  . loratadine  10 mg Oral Daily  . sodium chloride flush  5 mL Intracatheter Q8H    Continuous Inpatient Infusions:   . sodium chloride 50 mL (02/25/19 1005)  . ceFEPime (MAXIPIME) IV 2 g (02/25/19 1006)  . dextrose 5 % and 0.9 % NaCl with KCl 40 mEq/L Stopped (02/25/19 0225)  . metronidazole 500 mg (02/25/19 1125)    PRN Inpatient Medications:  sodium chloride, acetaminophen **OR** [DISCONTINUED] acetaminophen, HYDROcodone-acetaminophen, hydrocortisone cream, Melatonin, ondansetron **OR** ondansetron (ZOFRAN) IV, traZODone  Review of Systems: Constitutional: Weight is stable.  Eyes: No changes in vision. ENT: No oral lesions, sore throat.  GI: see HPI.  Heme/Lymph: No easy bruising.  CV: No chest pain.  GU: No hematuria.  Integumentary: No rashes.  Neuro: No headaches.  Psych: No depression/anxiety.  Endocrine: No heat/cold intolerance.  Allergic/Immunologic: No urticaria.  Resp: No cough, SOB.  Musculoskeletal: No joint swelling.    Physical Examination: BP (!) 153/87 (BP Location: Left Arm)   Pulse 76   Temp 97.7 F (36.5 C)   Resp 20   Ht 5' 10"  (1.778 m)   Wt 75.7 kg   SpO2 97%   BMI 23.95 kg/m  Gen: NAD, alert and oriented x 4 HEENT: PEERLA, EOMI, Neck: supple, no JVD or thyromegaly Chest: CTA bilaterally, no wheezes, crackles, or other adventitious sounds CV: RRR, no m/g/c/r Abd: soft, nondistended, mild tenderness to deep  palpation in RLQ as expected, percutaneous drain in place; no HSM, guarding, ridigity, or rebound tenderness Ext: no edema, well perfused with 2+ pulses, Skin: no rash or lesions noted Lymph: no LAD  Data: Lab Results  Component Value Date   WBC 12.3 (H) 02/25/2019   HGB 12.2 (L) 02/25/2019   HCT 35.1 (L) 02/25/2019   MCV 79.2 (L) 02/25/2019   PLT 515 (H) 02/25/2019   Recent Labs  Lab 02/23/19 0459 02/24/19 0453 02/25/19 0506  HGB 12.1* 11.6* 12.2*   Lab Results  Component Value Date   NA 138 02/25/2019   K 3.6 02/25/2019   CL 102 02/25/2019   CO2 26 02/25/2019   BUN 6 (L) 02/25/2019   CREATININE 0.70 02/25/2019   Lab Results  Component Value Date   ALT 22 02/21/2019   AST 32 02/21/2019   ALKPHOS 106 02/21/2019   BILITOT 0.8 02/21/2019   Recent Labs  Lab 02/23/19 0459  INR 1.3*   Assessment/Plan: Mr. Schoen is a 74 y.o. male with a PMH of Hx of BCC, borderline DM, GERD, Hx of PUD admitted for terminal ileitis with microabscesses  1. Terminal ileitis with microabscesses  -IR drain in place -Continue antibiotics -Colonoscopy will be needed after resolution of abdominal infection. Per daughter, he has appointment with Dr. Bonna Gains on 01/20 -Full liquid diet  -Following   Please call with questions or concerns.    Octavia Bruckner, PA-C [facility] [phone] (Cell)

## 2019-02-26 LAB — BASIC METABOLIC PANEL
Anion gap: 8 (ref 5–15)
BUN: 5 mg/dL — ABNORMAL LOW (ref 8–23)
CO2: 27 mmol/L (ref 22–32)
Calcium: 7.8 mg/dL — ABNORMAL LOW (ref 8.9–10.3)
Chloride: 102 mmol/L (ref 98–111)
Creatinine, Ser: 0.77 mg/dL (ref 0.61–1.24)
GFR calc Af Amer: >60 mL/min (ref >60)
GFR calc non Af Amer: >60 mL/min (ref >60)
Glucose, Bld: 108 mg/dL — ABNORMAL HIGH (ref 70–99)
Potassium: 4 mmol/L (ref 3.5–5.1)
Sodium: 137 mmol/L (ref 135–145)

## 2019-02-26 LAB — IRON AND TIBC
Iron: 29 ug/dL — ABNORMAL LOW (ref 45–182)
Saturation Ratios: 16 % — ABNORMAL LOW (ref 17.9–39.5)
TIBC: 179 ug/dL — ABNORMAL LOW (ref 250–450)
UIBC: 150 ug/dL

## 2019-02-26 LAB — CBC WITH DIFFERENTIAL/PLATELET
Abs Immature Granulocytes: 0.16 10*3/uL — ABNORMAL HIGH (ref 0.00–0.07)
Basophils Absolute: 0.1 10*3/uL (ref 0.0–0.1)
Basophils Relative: 0 %
Eosinophils Absolute: 0.2 10*3/uL (ref 0.0–0.5)
Eosinophils Relative: 2 %
HCT: 36.1 % — ABNORMAL LOW (ref 39.0–52.0)
Hemoglobin: 11.8 g/dL — ABNORMAL LOW (ref 13.0–17.0)
Immature Granulocytes: 1 %
Lymphocytes Relative: 10 %
Lymphs Abs: 1.2 10*3/uL (ref 0.7–4.0)
MCH: 27.2 pg (ref 26.0–34.0)
MCHC: 32.7 g/dL (ref 30.0–36.0)
MCV: 83.2 fL (ref 80.0–100.0)
Monocytes Absolute: 0.6 10*3/uL (ref 0.1–1.0)
Monocytes Relative: 5 %
Neutro Abs: 10.1 10*3/uL — ABNORMAL HIGH (ref 1.7–7.7)
Neutrophils Relative %: 82 %
Platelets: 567 10*3/uL — ABNORMAL HIGH (ref 150–400)
RBC: 4.34 MIL/uL (ref 4.22–5.81)
RDW: 13.4 % (ref 11.5–15.5)
WBC: 12.3 10*3/uL — ABNORMAL HIGH (ref 4.0–10.5)
nRBC: 0 % (ref 0.0–0.2)

## 2019-02-26 LAB — FERRITIN: Ferritin: 190 ng/mL (ref 24–336)

## 2019-02-26 MED ORDER — FERROUS SULFATE 325 (65 FE) MG PO TABS
325.0000 mg | ORAL_TABLET | Freq: Every day | ORAL | Status: DC
  Administered 2019-02-26 – 2019-02-28 (×3): 325 mg via ORAL
  Filled 2019-02-26 (×3): qty 1

## 2019-02-26 NOTE — Progress Notes (Signed)
PROGRESS NOTE    Todd Briggs  SNK:[id number] DOB: 11/11/1945 DOA: 02/21/2019 PCP: Sofie Hartigan, MD      Assessment & Plan:   Principal Problem:   Ileitis, terminal, with abscess [facility]) Active Problems:   Hypokalemia   Diarrhea   Rupture of bowel (August)   Terminal ileitis with microabscesses: continue IV cefepime & flagyl. S/p IR guided aspiration and a drainage placement on 02/23/2019. Wound cx growing moderate gram positive rods, few gram positive cocci in chains, rare yeast, rare gram neg rods. GI panel completely neg. GI and gen surg following and recs apprec  Hypokalemia: WNL today. Will continue to monitor   Bilateral pleural effusions:  Asymptomatic. Not requiring supplemental oxygen  Right renal mass: pt has a follow-up appointment with urologist.  Leukocytosis: likely secondary to infection. Continue on IV abxs  Thrombocytosis: likely reactive. Will continue to monitor   IDA & ACD: will start iron supplements. No need for a transfusion at this time. Will continue to monitor    DVT prophylaxis: lovenox Code Status: full  Family Communication:  Disposition Plan:    Consultants:   GI, gen surg   Procedures:  S/p IR guided aspiration and a drainage placement on 02/23/2019.   Antimicrobials: cefepime & flagyl   Subjective: Pt c/o abd pain   Objective: Vitals:   02/25/19 1221 02/25/19 2053 02/26/19 0448 02/26/19 0448  BP: (!) 143/83 (!) 152/81  (!) 162/85  Pulse: 71 71  70  Resp: 16 20  20   Temp: 97.7 F (36.5 C) 98.2 F (36.8 C)  (!) 97.5 F (36.4 C)  TempSrc: Oral Oral  Oral  SpO2: 95% 97%  98%  Weight:   73.7 kg   Height:        Intake/Output Summary (Last 24 hours) at 02/26/2019 0818 Last data filed at 02/26/2019 0550 Gross per 24 hour  Intake 310.13 ml  Output 40 ml  Net 270.13 ml   Filed Weights   02/21/19 0854 02/23/19 0400 02/26/19 0448  Weight: 74.8 kg 75.7 kg 73.7 kg    Examination:  General exam: Appears calm  and comfortable  Respiratory system: Clear to auscultation. No wheezes, rales Cardiovascular system: S1 & S2+. No rubs, gallops or clicks.  Gastrointestinal system: Abdomen is nondistended, soft and nontender. Normal bowel sounds heard. JP drain in place, no drainage present at time of exam  Central nervous system: Alert and oriented. Moves all 4 extremities Psychiatry: Judgement and insight appear normal. Normal mood and affect    Data Reviewed: I have personally reviewed following labs and imaging studies  CBC: Recent Labs  Lab 02/22/19 0539 02/23/19 0459 02/24/19 0453 02/25/19 0506 02/26/19 0457  WBC 12.9* 14.5* 14.2* 12.3* 12.3*  NEUTROABS  --   --  11.9* 10.0* 10.1*  HGB 11.8* 12.1* 11.6* 12.2* 11.8*  HCT 34.2* 34.8* 33.5* 35.1* 36.1*  MCV 79.5* 80.4 79.4* 79.2* 83.2  PLT 384 437* 440* 515* 937*   Basic Metabolic Panel: Recent Labs  Lab 02/21/19 0900 02/22/19 0539 02/23/19 0459 02/24/19 0453 02/25/19 0506 02/26/19 0457  NA 135 139 139 137 138 137  K 2.8* 3.0* 3.5 3.6 3.6 4.0  CL 97* 100 103 101 102 102  CO2 27 28 28 27 26 27   GLUCOSE 123* 97 102* 117* 102* 108*  BUN 13 10 9 8  6* 5*  CREATININE 0.98 0.99 0.96 0.78 0.70 0.77  CALCIUM 7.5* 7.1* 7.3* 7.2* 7.4* 7.8*  MG 1.9 2.0  --   --   --   --  PHOS 3.2  --   --   --   --   --    GFR: Estimated Creatinine Clearance: 82.4 mL/min (by C-G formula based on SCr of 0.77 mg/dL). Liver Function Tests: Recent Labs  Lab 02/21/19 0900  AST 32  ALT 22  ALKPHOS 106  BILITOT 0.8  PROT 5.6*  ALBUMIN 2.4*   Recent Labs  Lab 02/21/19 0900  LIPASE 33   No results for input(s): AMMONIA in the last 168 hours. Coagulation Profile: Recent Labs  Lab 02/23/19 0459  INR 1.3*   Cardiac Enzymes: No results for input(s): CKTOTAL, CKMB, CKMBINDEX, TROPONINI in the last 168 hours. BNP (last 3 results) No results for input(s): PROBNP in the last 8760 hours. HbA1C: No results for input(s): HGBA1C in the last 72  hours. CBG: No results for input(s): GLUCAP in the last 168 hours. Lipid Profile: No results for input(s): CHOL, HDL, LDLCALC, TRIG, CHOLHDL, LDLDIRECT in the last 72 hours. Thyroid Function Tests: No results for input(s): TSH, T4TOTAL, FREET4, T3FREE, THYROIDAB in the last 72 hours. Anemia Panel: Recent Labs    02/26/19 0457  FERRITIN 190  TIBC 179*  IRON 29*   Sepsis Labs: No results for input(s): PROCALCITON, LATICACIDVEN in the last 168 hours.  Recent Results (from the past 240 hour(s))  GI pathogen panel by PCR, stool     Status: None   Collection Time: 02/21/19  4:48 PM   Specimen: Stool  Result Value Ref Range Status   Plesiomonas shigelloides NOT DETECTED NOT DETECTED Final   Yersinia enterocolitica NOT DETECTED NOT DETECTED Final   Vibrio NOT DETECTED NOT DETECTED Final   Enteropathogenic E coli NOT DETECTED NOT DETECTED Final   E coli (ETEC) LT/ST NOT DETECTED NOT DETECTED Final   E coli 0630 by PCR Not applicable NOT DETECTED Final   Cryptosporidium by PCR NOT DETECTED NOT DETECTED Final   Entamoeba histolytica NOT DETECTED NOT DETECTED Final   Adenovirus F 40/41 NOT DETECTED NOT DETECTED Final   Norovirus GI/GII NOT DETECTED NOT DETECTED Final   Sapovirus NOT DETECTED NOT DETECTED Final    Comment: (NOTE) Performed At: [facility] [place], [place] [id number] Rush Farmer MD FT:[id number]    Vibrio cholerae NOT DETECTED NOT DETECTED Final   Campylobacter by PCR NOT DETECTED NOT DETECTED Final   Salmonella by PCR NOT DETECTED NOT DETECTED Final   E coli (STEC) NOT DETECTED NOT DETECTED Final   Enteroaggregative E coli NOT DETECTED NOT DETECTED Final   Shigella by PCR NOT DETECTED NOT DETECTED Final   Cyclospora cayetanensis NOT DETECTED NOT DETECTED Final   Astrovirus NOT DETECTED NOT DETECTED Final   G lamblia by PCR NOT DETECTED NOT DETECTED Final   Rotavirus A by PCR NOT DETECTED NOT DETECTED Final  C Difficile Quick Screen w PCR  reflex     Status: None   Collection Time: 02/21/19  4:48 PM   Specimen: Stool  Result Value Ref Range Status   C Diff antigen NEGATIVE NEGATIVE Final   C Diff toxin NEGATIVE NEGATIVE Final   C Diff interpretation No C. difficile detected.  Final    Comment: Performed at [facility], [place], [place] [address]  SARS CORONAVIRUS 2 (TAT 6-24 HRS) Nasopharyngeal Nasopharyngeal Swab     Status: None   Collection Time: 02/21/19  6:15 PM   Specimen: Nasopharyngeal Swab  Result Value Ref Range Status   SARS Coronavirus 2 NEGATIVE NEGATIVE Final  Comment: (NOTE) SARS-CoV-2 target nucleic acids are NOT DETECTED. The SARS-CoV-2 RNA is generally detectable in upper and lower respiratory specimens during the acute phase of infection. Negative results do not preclude SARS-CoV-2 infection, do not rule out co-infections with other pathogens, and should not be used as the sole basis for treatment or other patient management decisions. Negative results must be combined with clinical observations, patient history, and epidemiological information. The expected result is Negative. Fact Sheet for Patients: SugarRoll.be Fact Sheet for Healthcare Providers: https://www.woods-mathews.com/ This test is not yet approved or cleared by the [place] FDA and  has been authorized for detection and/or diagnosis of SARS-CoV-2 by FDA under an Emergency Use Authorization (EUA). This EUA will remain  in effect (meaning this test can be used) for the duration of the COVID-19 declaration under Section 56 4(b)(1) of the Act, 21 U.S.C. section 360bbb-3(b)(1), unless the authorization is terminated or revoked sooner. Performed at [facility], [place] [address], [place], [place] [address]   Aerobic/Anaerobic Culture (surgical/deep wound)     Status: Abnormal (Preliminary result)   Collection Time: 02/23/19 11:30 AM   Specimen: Wound; Abscess   Result Value Ref Range Status   Specimen Description   Final    WOUND Performed at [facility], [address] [place], [place] [address]    Special Requests   Final    Normal Performed at [facility], [address] [place], [place] [address]    Gram Stain   Final    MODERATE WBC PRESENT, PREDOMINANTLY PMN MODERATE GRAM POSITIVE RODS FEW GRAM POSITIVE COCCI IN CHAINS RARE YEAST RARE GRAM NEGATIVE RODS Performed at [facility], [place] [address], [place], [place] [address]    Culture MULTIPLE ORGANISMS PRESENT, NONE PREDOMINANT (A)  Final   Report Status PENDING  Incomplete  CULTURE, BLOOD (ROUTINE X 2) w Reflex to ID Panel     Status: None (Preliminary result)   Collection Time: 02/23/19  2:14 PM   Specimen: BLOOD  Result Value Ref Range Status   Specimen Description BLOOD RIGHT ANTECUBITAL  Final   Special Requests   Final    BOTTLES DRAWN AEROBIC AND ANAEROBIC Blood Culture adequate volume   Culture   Final    NO GROWTH 3 DAYS Performed at [facility], [address], [place], [place] [address]    Report Status PENDING  Incomplete  CULTURE, BLOOD (ROUTINE X 2) w Reflex to ID Panel     Status: None (Preliminary result)   Collection Time: 02/23/19  2:22 PM   Specimen: BLOOD  Result Value Ref Range Status   Specimen Description BLOOD BLOOD RIGHT HAND  Final   Special Requests   Final    BOTTLES DRAWN AEROBIC AND ANAEROBIC Blood Culture adequate volume   Culture   Final    NO GROWTH 3 DAYS Performed at [facility], [address] [place],  [address]    Report Status PENDING  Incomplete         Radiology Studies: No results found.      Scheduled Meds: . enoxaparin (LOVENOX) injection  40 mg Subcutaneous Q24H  . influenza vaccine adjuvanted  0.5 mL Intramuscular Tomorrow-1000  . lidocaine  1 patch Transdermal Q24H  . loratadine  10 mg Oral Daily  . sodium chloride flush  5 mL Intracatheter Q8H    Continuous Infusions: . sodium chloride 50 mL (02/25/19 1733)  . ceFEPime (MAXIPIME) IV 2 g (02/26/19 0254)  .  dextrose 5 % and 0.9 % NaCl with KCl 40 mEq/L 100 mL/hr at 02/25/19 2159  . metronidazole 500 mg (02/26/19 0414)     LOS: 5 days    Time spent: 30 mins    Wyvonnia Dusky, MD [facility] Pager [phone]  If 7PM-7AM, please contact night-coverage [email or web address] Password [facility] 02/26/2019, 8:18 AM

## 2019-02-26 NOTE — Progress Notes (Signed)
02/26/2019  Subjective: No acute events.  Patient reports his pain is doing well.  Had two BMs.  Tolerated full liquid diet.  Vital signs: Temp:  [97.5 F (36.4 C)-98.2 F (36.8 C)] 98.2 F (36.8 C) (01/02 1244) Pulse Rate:  [70-71] 70 (01/02 1244) Resp:  [14-20] 14 (01/02 1244) BP: (151-162)/(81-85) 151/82 (01/02 1244) SpO2:  [95 %-98 %] 95 % (01/02 1244) Weight:  [73.7 kg] 73.7 kg (01/02 0448)   Intake/Output: 01/01 0701 - 01/02 0700 In: 310.1 [I.V.:105.1; IV Piggyback:200] Out: 40 [Drains:40] Last BM Date: 02/26/19  Physical Exam: Constitutional: No acute diistress Abdomen:  Soft, non-distended, currently non-tender except at the drain insertion site itself.  Drain in place with seropurulent fluid in bulb.    Labs:  Recent Labs    02/25/19 0506 02/26/19 0457  WBC 12.3* 12.3*  HGB 12.2* 11.8*  HCT 35.1* 36.1*  PLT 515* 567*   Recent Labs    02/25/19 0506 02/26/19 0457  NA 138 137  K 3.6 4.0  CL 102 102  CO2 26 27  GLUCOSE 102* 108*  BUN 6* 5*  CREATININE 0.70 0.77  CALCIUM 7.4* 7.8*   No results for input(s): LABPROT, INR in the last 72 hours.  Imaging: No results found.  Assessment/Plan: This is a 74 y.o. male with terminal ileitis with abscess s/p percutaneous drainage.  --Cultures currently showing multiple organisms with moderate GPR, few GPC in chains, rare yeast, and rare GNR.  Currently on IV Cefepime and Flagyl.  Final results pending still. --WBC stable at 12.3 today.  Clinically the patient doing well. --Will advance to soft diet today.  Pending his improvement, could potentially be discharged tomorrow on oral antibiotics and close follow up with me and Dr. Bonna Gains.   Melvyn Neth, [facility]

## 2019-02-26 NOTE — Progress Notes (Addendum)
GI Inpatient Follow-up Note  Subjective:  Patient seen in follow-up for terminal ileitis with microabscesses. No acute events overnight. He has been tolerating full liquid diet without any difficulties. He denies nausea, vomiting, abdominal pain. He reports two BMs this morning - one formed and one loose without any hematochezia or melena. Cultures growing moderate gram positive rods, few gram positive cocci in chains, rare yeast, and rare gram negative rods. He remains on IV antibiotics.   Scheduled Inpatient Medications:  . enoxaparin (LOVENOX) injection  40 mg Subcutaneous Q24H  . ferrous sulfate  325 mg Oral Q breakfast  . influenza vaccine adjuvanted  0.5 mL Intramuscular Tomorrow-1000  . lidocaine  1 patch Transdermal Q24H  . loratadine  10 mg Oral Daily  . sodium chloride flush  5 mL Intracatheter Q8H    Continuous Inpatient Infusions:   . sodium chloride 50 mL (02/26/19 0826)  . ceFEPime (MAXIPIME) IV 2 g (02/26/19 0827)  . dextrose 5 % and 0.9 % NaCl with KCl 40 mEq/L 100 mL/hr at 02/25/19 2159  . metronidazole 500 mg (02/26/19 0414)    PRN Inpatient Medications:  sodium chloride, acetaminophen **OR** [DISCONTINUED] acetaminophen, HYDROcodone-acetaminophen, hydrocortisone cream, Melatonin, ondansetron **OR** ondansetron (ZOFRAN) IV, traZODone  Review of Systems: Constitutional: Weight is stable.  Eyes: No changes in vision. ENT: No oral lesions, sore throat.  GI: see HPI.  Heme/Lymph: No easy bruising.  CV: No chest pain.  GU: No hematuria.  Integumentary: No rashes.  Neuro: No headaches.  Psych: No depression/anxiety.  Endocrine: No heat/cold intolerance.  Allergic/Immunologic: No urticaria.  Resp: No cough, SOB.  Musculoskeletal: No joint swelling.    Physical Examination: BP (!) 162/85 (BP Location: Left Arm)   Pulse 70   Temp (!) 97.5 F (36.4 C) (Oral)   Resp 20   Ht 5' 10"  (1.778 m)   Wt 73.7 kg   SpO2 98%   BMI 23.31 kg/m  Gen: NAD, alert and  oriented x 4 HEENT: PEERLA, EOMI, Neck: supple, no JVD or thyromegaly Chest: CTA bilaterally, no wheezes, crackles, or other adventitious sounds CV: RRR, no m/g/c/r Abd: soft,  ND, +BS in all four quadrants; appropriately tender to palpation in RLQ, no HSM, guarding, ridigity, or rebound tenderness Ext: no edema, well perfused with 2+ pulses, Skin: no rash or lesions noted Lymph: no LAD  Data: Lab Results  Component Value Date   WBC 12.3 (H) 02/26/2019   HGB 11.8 (L) 02/26/2019   HCT 36.1 (L) 02/26/2019   MCV 83.2 02/26/2019   PLT 567 (H) 02/26/2019   Recent Labs  Lab 02/24/19 0453 02/25/19 0506 02/26/19 0457  HGB 11.6* 12.2* 11.8*   Lab Results  Component Value Date   NA 137 02/26/2019   K 4.0 02/26/2019   CL 102 02/26/2019   CO2 27 02/26/2019   BUN 5 (L) 02/26/2019   CREATININE 0.77 02/26/2019   Lab Results  Component Value Date   ALT 22 02/21/2019   AST 32 02/21/2019   ALKPHOS 106 02/21/2019   BILITOT 0.8 02/21/2019   Recent Labs  Lab 02/23/19 0459  INR 1.3*   Assessment/Plan:  Mr. Krol is a 74 y.o. male with a PMH of Hx of BCC, borderline DM, GERD, Hx of PUD admitted for terminal ileitis with microabscesses  1. Terminal ileitis with microabscesses  -s/p IR guided aspiration and drainage on 12/30 -Continue IV antibiotics -Colonoscopy will be needed after resolution of abdominal infection. Per daughter, he has appointment with Dr. Bonna Gains on 01/20 -Diet  advanced to soft diet -Surgery following. Appreciate input.  -Dr. Bonna Gains will be back tomorrow and will resume taking over patient's care   Please call with questions or concerns.    Octavia Bruckner, PA-C [facility] [phone] (Cell)

## 2019-02-27 LAB — AEROBIC/ANAEROBIC CULTURE W GRAM STAIN (SURGICAL/DEEP WOUND): Special Requests: NORMAL

## 2019-02-27 LAB — BASIC METABOLIC PANEL
Anion gap: 7 (ref 5–15)
BUN: 6 mg/dL — ABNORMAL LOW (ref 8–23)
CO2: 28 mmol/L (ref 22–32)
Calcium: 7.9 mg/dL — ABNORMAL LOW (ref 8.9–10.3)
Chloride: 100 mmol/L (ref 98–111)
Creatinine, Ser: 0.89 mg/dL (ref 0.61–1.24)
GFR calc Af Amer: >60 mL/min (ref >60)
GFR calc non Af Amer: >60 mL/min (ref >60)
Glucose, Bld: 113 mg/dL — ABNORMAL HIGH (ref 70–99)
Potassium: 4.5 mmol/L (ref 3.5–5.1)
Sodium: 135 mmol/L (ref 135–145)

## 2019-02-27 LAB — CBC WITH DIFFERENTIAL/PLATELET
Abs Immature Granulocytes: 0.16 10*3/uL — ABNORMAL HIGH (ref 0.00–0.07)
Basophils Absolute: 0.1 10*3/uL (ref 0.0–0.1)
Basophils Relative: 1 %
Eosinophils Absolute: 0.2 10*3/uL (ref 0.0–0.5)
Eosinophils Relative: 2 %
HCT: 37.9 % — ABNORMAL LOW (ref 39.0–52.0)
Hemoglobin: 12.3 g/dL — ABNORMAL LOW (ref 13.0–17.0)
Immature Granulocytes: 2 %
Lymphocytes Relative: 11 %
Lymphs Abs: 1 10*3/uL (ref 0.7–4.0)
MCH: 27.1 pg (ref 26.0–34.0)
MCHC: 32.5 g/dL (ref 30.0–36.0)
MCV: 83.5 fL (ref 80.0–100.0)
Monocytes Absolute: 0.7 10*3/uL (ref 0.1–1.0)
Monocytes Relative: 8 %
Neutro Abs: 7.4 10*3/uL (ref 1.7–7.7)
Neutrophils Relative %: 76 %
Platelets: 563 10*3/uL — ABNORMAL HIGH (ref 150–400)
RBC: 4.54 MIL/uL (ref 4.22–5.81)
RDW: 13.4 % (ref 11.5–15.5)
WBC: 9.6 10*3/uL (ref 4.0–10.5)
nRBC: 0 % (ref 0.0–0.2)

## 2019-02-27 NOTE — Progress Notes (Addendum)
PROGRESS NOTE    Todd Briggs  XMI:[id number] DOB: [birth year] DOA: 02/21/2019 PCP: Sofie Hartigan, MD      Assessment & Plan:   Principal Problem:   Ileitis, terminal, with abscess [facility]) Active Problems:   Hypokalemia   Diarrhea   Rupture of bowel ([place])   Terminal ileitis with microabscesses: continue IV cefepime & flagyl. S/p IR guided aspiration and a drainage placement on 02/23/2019. Wound cx growing moderate gram positive rods, few gram positive cocci in chains, rare yeast, rare gram neg rods. Possible anaerobe growing as well, likely will be finalized tomorrow for anaerobe only as per micro lab. GI panel completely neg. GI and gen surg following and recs apprec  Hypokalemia: WNL again today. Will continue to monitor   Bilateral pleural effusions:  Asymptomatic. Not requiring supplemental oxygen  Right renal mass: pt has a follow-up appointment with urologist.  Leukocytosis: resolved  Thrombocytosis: likely reactive. 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 Will continue iron supplements. No need for a transfusion at this time. Will continue to monitor    DVT prophylaxis: lovenox Code Status: full  Family Communication:  Disposition Plan:    Consultants:   GI, gen surg   Procedures:  S/p IR guided aspiration and a drainage placement on 02/23/2019.   Antimicrobials: cefepime & flagyl   Subjective: Pt c/o fatigue.   Objective: Vitals:   02/26/19 1244 02/26/19 2046 02/27/19 0407 02/27/19 0513  BP: (!) 151/82 (!) 151/92  (!) 160/88  Pulse: 70 76  73  Resp: 14 16  16   Temp: 98.2 F (36.8 C) 97.8 F (36.6 C)  98 F (36.7 C)  TempSrc: Oral Oral  Oral  SpO2: 95% 97%  91%  Weight:   70.7 kg   Height:        Intake/Output Summary (Last 24 hours) at 02/27/2019 0837 Last data filed at 02/27/2019 0411 Gross per 24 hour  Intake 4063.88 ml  Output 20 ml  Net 4043.88 ml   Filed Weights   02/23/19 0400 02/26/19 0448 02/27/19 0407    Weight: 75.7 kg 73.7 kg 70.7 kg    Examination:  General exam: Appears calm and comfortable  Respiratory system: Clear to auscultation. No wheezes, rhonchi Cardiovascular system: S1 & S2+. No rubs, gallops or clicks.  Gastrointestinal system: Abdomen is nondistended, soft and nontender. Normal bowel sounds heard. JP drain in place draining pus like fluid  Central nervous system: Alert and oriented. Moves all 4 extremities Psychiatry: Judgement and insight appear normal. Normal mood and affect    Data Reviewed: I have personally reviewed following labs and imaging studies  CBC: Recent Labs  Lab 02/23/19 0459 02/24/19 0453 02/25/19 0506 02/26/19 0457 02/27/19 0556  WBC 14.5* 14.2* 12.3* 12.3* 9.6  NEUTROABS  --  11.9* 10.0* 10.1* 7.4  HGB 12.1* 11.6* 12.2* 11.8* 12.3*  HCT 34.8* 33.5* 35.1* 36.1* 37.9*  MCV 80.4 79.4* 79.2* 83.2 83.5  PLT 437* 440* 515* 567* 825*   Basic Metabolic Panel: Recent Labs  Lab 02/21/19 0900 02/22/19 0539 02/23/19 0459 02/24/19 0453 02/25/19 0506 02/26/19 0457 02/27/19 0556  NA 135 139 139 137 138 137 135  K 2.8* 3.0* 3.5 3.6 3.6 4.0 4.5  CL 97* 100 103 101 102 102 100  CO2 27 28 28 27 26 27 28   GLUCOSE 123* 97 102* 117* 102* 108* 113*  BUN 13 10 9 8  6* 5* 6*  CREATININE 0.98 0.99 0.96 0.78 0.70 0.77 0.89  CALCIUM 7.5* 7.1* 7.3* 7.2* 7.4* 7.8* 7.9*  MG 1.9 2.0  --   --   --   --   --   PHOS 3.2  --   --   --   --   --   --    GFR: Estimated Creatinine Clearance: 71.7 mL/min (by C-G formula based on SCr of 0.89 mg/dL). Liver Function Tests: Recent Labs  Lab 02/21/19 0900  AST 32  ALT 22  ALKPHOS 106  BILITOT 0.8  PROT 5.6*  ALBUMIN 2.4*   Recent Labs  Lab 02/21/19 0900  LIPASE 33   No results for input(s): AMMONIA in the last 168 hours. Coagulation Profile: Recent Labs  Lab 02/23/19 0459  INR 1.3*   Cardiac Enzymes: No results for input(s): CKTOTAL, CKMB, CKMBINDEX, TROPONINI in the last 168 hours. BNP (last 3  results) No results for input(s): PROBNP in the last 8760 hours. HbA1C: No results for input(s): HGBA1C in the last 72 hours. CBG: No results for input(s): GLUCAP in the last 168 hours. Lipid Profile: No results for input(s): CHOL, HDL, LDLCALC, TRIG, CHOLHDL, LDLDIRECT in the last 72 hours. Thyroid Function Tests: No results for input(s): TSH, T4TOTAL, FREET4, T3FREE, THYROIDAB in the last 72 hours. Anemia Panel: Recent Labs    02/26/19 0457  FERRITIN 190  TIBC 179*  IRON 29*   Sepsis Labs: No results for input(s): PROCALCITON, LATICACIDVEN in the last 168 hours.  Recent Results (from the past 240 hour(s))  GI pathogen panel by PCR, stool     Status: None   Collection Time: 02/21/19  4:48 PM   Specimen: Stool  Result Value Ref Range Status   Plesiomonas shigelloides NOT DETECTED NOT DETECTED Final   Yersinia enterocolitica NOT DETECTED NOT DETECTED Final   Vibrio NOT DETECTED NOT DETECTED Final   Enteropathogenic E coli NOT DETECTED NOT DETECTED Final   E coli (ETEC) LT/ST NOT DETECTED NOT DETECTED Final   E coli 8466 by PCR Not applicable NOT DETECTED Final   Cryptosporidium by PCR NOT DETECTED NOT DETECTED Final   Entamoeba histolytica NOT DETECTED NOT DETECTED Final   Adenovirus F 40/41 NOT DETECTED NOT DETECTED Final   Norovirus GI/GII NOT DETECTED NOT DETECTED Final   Sapovirus NOT DETECTED NOT DETECTED Final    Comment: (NOTE) Performed At: [facility] [place], [place] [id number] Rush Farmer MD BL:[id number]    Vibrio cholerae NOT DETECTED NOT DETECTED Final   Campylobacter by PCR NOT DETECTED NOT DETECTED Final   Salmonella by PCR NOT DETECTED NOT DETECTED Final   E coli (STEC) NOT DETECTED NOT DETECTED Final   Enteroaggregative E coli NOT DETECTED NOT DETECTED Final   Shigella by PCR NOT DETECTED NOT DETECTED Final   Cyclospora cayetanensis NOT DETECTED NOT DETECTED Final   Astrovirus NOT DETECTED NOT DETECTED Final   G lamblia by  PCR NOT DETECTED NOT DETECTED Final   Rotavirus A by PCR NOT DETECTED NOT DETECTED Final  C Difficile Quick Screen w PCR reflex     Status: None   Collection Time: 02/21/19  4:48 PM   Specimen: Stool  Result Value Ref Range Status   C Diff antigen NEGATIVE NEGATIVE Final   C Diff toxin NEGATIVE NEGATIVE Final   C Diff interpretation No C. difficile detected.  Final    Comment: Performed at [facility], [place], [place] [address]  SARS CORONAVIRUS 2 (TAT 6-24 HRS) Nasopharyngeal Nasopharyngeal Swab  Status: None   Collection Time: 02/21/19  6:15 PM   Specimen: Nasopharyngeal Swab  Result Value Ref Range Status   SARS Coronavirus 2 NEGATIVE NEGATIVE Final    Comment: (NOTE) SARS-CoV-2 target nucleic acids are NOT DETECTED. The SARS-CoV-2 RNA is generally detectable in upper and lower respiratory specimens during the acute phase of infection. Negative results do not preclude SARS-CoV-2 infection, do not rule out co-infections with other pathogens, and should not be used as the sole basis for treatment or other patient management decisions. Negative results must be combined with clinical observations, patient history, and epidemiological information. The expected result is Negative. Fact Sheet for Patients: SugarRoll.be Fact Sheet for Healthcare Providers: https://www.woods-mathews.com/ This test is not yet approved or cleared by the [place] FDA and  has been authorized for detection and/or diagnosis of SARS-CoV-2 by FDA under an Emergency Use Authorization (EUA). This EUA will remain  in effect (meaning this test can be used) for the duration of the COVID-19 declaration under Section 56 4(b)(1) of the Act, 21 U.S.C. section 360bbb-3(b)(1), unless the authorization is terminated or revoked sooner. Performed at [facility], [place] [address] [place], [place] [address]   Aerobic/Anaerobic Culture  (surgical/deep wound)     Status: Abnormal (Preliminary result)   Collection Time: 02/23/19 11:30 AM   Specimen: Wound; Abscess  Result Value Ref Range Status   Specimen Description   Final    WOUND Performed at [facility], [address], [place], [place] [address]    Special Requests   Final    Normal Performed at [facility], [address] [place], [place] [address]    Gram Stain   Final    MODERATE WBC PRESENT, PREDOMINANTLY PMN MODERATE GRAM POSITIVE RODS FEW GRAM POSITIVE COCCI IN CHAINS RARE YEAST RARE GRAM NEGATIVE RODS Performed at [facility], [place] [address], [place], [place] [address]    Culture MULTIPLE ORGANISMS PRESENT, NONE PREDOMINANT (A)  Final   Report Status PENDING  Incomplete  CULTURE, BLOOD (ROUTINE X 2) w Reflex to ID Panel     Status: None (Preliminary result)   Collection Time: 02/23/19  2:14 PM   Specimen: BLOOD  Result Value Ref Range Status   Specimen Description BLOOD RIGHT ANTECUBITAL  Final   Special Requests   Final    BOTTLES DRAWN AEROBIC AND ANAEROBIC Blood Culture adequate volume   Culture   Final    NO GROWTH 4 DAYS Performed at [facility], [address], [place], [place] [address]    Report Status PENDING  Incomplete  CULTURE, BLOOD (ROUTINE X 2) w Reflex to ID Panel     Status: None (Preliminary result)   Collection Time: 02/23/19  2:22 PM   Specimen: BLOOD  Result Value Ref Range Status   Specimen Description BLOOD BLOOD RIGHT HAND  Final   Special Requests   Final    BOTTLES DRAWN AEROBIC AND ANAEROBIC Blood Culture adequate volume   Culture   Final    NO GROWTH 4 DAYS Performed at [facility], [address] [place],  [address]    Report Status PENDING  Incomplete         Radiology Studies: No results found.      Scheduled Meds: . enoxaparin (LOVENOX) injection  40 mg Subcutaneous Q24H  . ferrous sulfate  325 mg Oral Q breakfast  . influenza vaccine  adjuvanted  0.5 mL Intramuscular Tomorrow-1000  . lidocaine  1 patch  Transdermal Q24H  . loratadine  10 mg Oral Daily  . sodium chloride flush  5 mL Intracatheter Q8H   Continuous Infusions: . sodium chloride Stopped (02/26/19 2123)  . ceFEPime (MAXIPIME) IV Stopped (02/27/19 0116)  . dextrose 5 % and 0.9 % NaCl with KCl 40 mEq/L Stopped (02/27/19 0405)  . metronidazole 100 mL/hr at 02/27/19 0411     LOS: 6 days    Time spent: 30 mins    Wyvonnia Dusky, MD [facility] Pager [phone]  If 7PM-7AM, please contact night-coverage [email or web address] Password [facility] 02/27/2019, 8:37 AM

## 2019-02-27 NOTE — Progress Notes (Addendum)
02/27/2019  Subjective: No acute events.  Patient reports his pain is doing well.  Stooling with flatus.  Tolerated soft diet.  Vital signs: Temp:  [97.8 F (36.6 C)-98.2 F (36.8 C)] 98 F (36.7 C) (01/03 0513) Pulse Rate:  [70-76] 73 (01/03 0513) Resp:  [14-16] 16 (01/03 0513) BP: (151-160)/(82-92) 160/88 (01/03 0513) SpO2:  [91 %-97 %] 91 % (01/03 0513) Weight:  [70.7 kg] 70.7 kg (01/03 0407)   Intake/Output: 01/02 0701 - 01/03 0700 In: 4063.9 [P.O.:840; I.V.:2391.4; IV Piggyback:827.4] Out: 20 [Drains:20] Last BM Date: 02/26/19  Physical Exam: Constitutional: No acute diistress Abdomen:  Soft, non-distended, currently non-tender except at the drain insertion site itself.  Drain in place with seropurulent fluid in bulb.    Labs:  Recent Labs    02/26/19 0457 02/27/19 0556  WBC 12.3* 9.6  HGB 11.8* 12.3*  HCT 36.1* 37.9*  PLT 567* 563*   Recent Labs    02/26/19 0457 02/27/19 0556  NA 137 135  K 4.0 4.5  CL 102 100  CO2 27 28  GLUCOSE 108* 113*  BUN 5* 6*  CREATININE 0.77 0.89  CALCIUM 7.8* 7.9*   No results for input(s): LABPROT, INR in the last 72 hours.  Imaging: No results found.  Assessment/Plan: This is a 74 y.o. male with terminal ileitis with abscess s/p percutaneous drainage.  --Cultures currently showing multiple organisms with moderate GPR, few GPC in chains, rare yeast, and rare GNR.  Currently on IV Cefepime and Flagyl.  Final results pending still. --WBC down to 9.6 today.  Clinically the patient doing well. --Pending his improvement, could potentially be discharged today with 10 more days of oral antibiotics and follow up Dr. Hampton Abbot &  Dr. Bonna Gains. Pt desires to defer d/h until tomorrow for various reasons. F/u on cultures, etc.

## 2019-02-28 LAB — BASIC METABOLIC PANEL
Anion gap: 8 (ref 5–15)
BUN: 8 mg/dL (ref 8–23)
CO2: 26 mmol/L (ref 22–32)
Calcium: 8.2 mg/dL — ABNORMAL LOW (ref 8.9–10.3)
Chloride: 100 mmol/L (ref 98–111)
Creatinine, Ser: 0.82 mg/dL (ref 0.61–1.24)
GFR calc Af Amer: >60 mL/min (ref >60)
GFR calc non Af Amer: >60 mL/min (ref >60)
Glucose, Bld: 98 mg/dL (ref 70–99)
Potassium: 4.4 mmol/L (ref 3.5–5.1)
Sodium: 134 mmol/L — ABNORMAL LOW (ref 135–145)

## 2019-02-28 LAB — CBC WITH DIFFERENTIAL/PLATELET
Abs Immature Granulocytes: 0.17 10*3/uL — ABNORMAL HIGH (ref 0.00–0.07)
Basophils Absolute: 0.1 10*3/uL (ref 0.0–0.1)
Basophils Relative: 1 %
Eosinophils Absolute: 0.3 10*3/uL (ref 0.0–0.5)
Eosinophils Relative: 3 %
HCT: 36.2 % — ABNORMAL LOW (ref 39.0–52.0)
Hemoglobin: 12.5 g/dL — ABNORMAL LOW (ref 13.0–17.0)
Immature Granulocytes: 2 %
Lymphocytes Relative: 14 %
Lymphs Abs: 1.5 10*3/uL (ref 0.7–4.0)
MCH: 27.5 pg (ref 26.0–34.0)
MCHC: 34.5 g/dL (ref 30.0–36.0)
MCV: 79.7 fL — ABNORMAL LOW (ref 80.0–100.0)
Monocytes Absolute: 0.7 10*3/uL (ref 0.1–1.0)
Monocytes Relative: 6 %
Neutro Abs: 7.7 10*3/uL (ref 1.7–7.7)
Neutrophils Relative %: 74 %
Platelets: 596 10*3/uL — ABNORMAL HIGH (ref 150–400)
RBC: 4.54 MIL/uL (ref 4.22–5.81)
RDW: 13.7 % (ref 11.5–15.5)
WBC: 10.3 10*3/uL (ref 4.0–10.5)
nRBC: 0 % (ref 0.0–0.2)

## 2019-02-28 LAB — CULTURE, BLOOD (ROUTINE X 2)
Culture: NO GROWTH
Culture: NO GROWTH
Special Requests: ADEQUATE
Special Requests: ADEQUATE

## 2019-02-28 MED ORDER — CEFDINIR 300 MG PO CAPS: 300.0000 mg | ORAL_CAPSULE | Freq: Two times a day (BID) | ORAL | 0 refills | Status: AC

## 2019-02-28 MED ORDER — ACETAMINOPHEN 325 MG PO TABS: 650.0000 mg | ORAL_TABLET | Freq: Four times a day (QID) | ORAL | 0 refills | Status: AC | PRN

## 2019-02-28 MED ORDER — METRONIDAZOLE 500 MG PO TABS: 500.0000 mg | ORAL_TABLET | Freq: Three times a day (TID) | ORAL | 0 refills | Status: AC

## 2019-02-28 MED ORDER — FERROUS SULFATE 325 (65 FE) MG PO TABS: 325.0000 mg | ORAL_TABLET | Freq: Every day | ORAL | 0 refills | Status: DC

## 2019-02-28 NOTE — Care Management Important Message (Signed)
Important Message  Patient Details  Name: Todd Briggs MRN: [id number] Date of Birth: 03/04/1945   Medicare Important Message Given:  Yes     Dannette Barbara 02/28/2019, 12:04 PM

## 2019-02-28 NOTE — Progress Notes (Signed)
[facility] SURGICAL PROGRESS NOTE (cpt [phone])  Hospital Day(s): 7.   Interval History: Patient seen and examined, no acute events or new complaints overnight. Patient denied any fever, chills, nausea, emesis or abdominal pain. He has tolerated a soft diet without issue. Having bowel movements and passing flatus. No other new complaints. Drain in RLQ with 35 ccs out.  Review of Systems:  Constitutional: denies fever, chills  HEENT: denies cough or congestion  Respiratory: denies any shortness of breath  Cardiovascular: denies chest pain or palpitations  Gastrointestinal: denies abdominal pain, N/V, or diarrhea/and bowel function as per interval history Genitourinary: denies burning with urination or urinary frequency   Vital signs in last 24 hours: [min-max] current  Temp:  [97.5 F (36.4 C)-98.1 F (36.7 C)] 98.1 F (36.7 C) (01/04 0505) Pulse Rate:  [72-80] 80 (01/04 0505) Resp:  [16-18] 16 (01/04 0505) BP: (139-176)/(85-91) 147/91 (01/04 0505) SpO2:  [96 %-98 %] 96 % (01/04 0505) Weight:  [69.8 kg] 69.8 kg (01/04 0500)     Height: 5' 10"  (177.8 cm) Weight: 69.8 kg BMI (Calculated): 22.08   Intake/Output last 2 shifts:  01/03 0701 - 01/04 0700 In: 1323.7 [P.O.:600; I.V.:21.4; IV Piggyback:692.2] Out: 35 [Drains:35]   Physical Exam:  Constitutional: alert, cooperative and no distress  HENT: normocephalic without obvious abnormality  Eyes: PERRL, EOM's grossly intact and symmetric  Respiratory: breathing non-labored at rest  Cardiovascular: regular rate and sinus rhythm  Gastrointestinal: soft, non-tender, and non-distended. No rebound/guarding. Drain in RLQ with seropurulent output Musculoskeletal: no edema or wounds, motor and sensation grossly intact, NT    Labs:  CBC Latest Ref Rng & Units 02/28/2019 02/27/2019 02/26/2019  WBC 4.0 - 10.5 K/uL 10.3 9.6 12.3(H)  Hemoglobin 13.0 - 17.0 g/dL 12.5(L) 12.3(L) 11.8(L)  Hematocrit 39.0 - 52.0 % 36.2(L) 37.9(L)  36.1(L)  Platelets 150 - 400 K/uL 596(H) 563(H) 567(H)   CMP Latest Ref Rng & Units 02/28/2019 02/27/2019 02/26/2019  Glucose 70 - 99 mg/dL 98 113(H) 108(H)  BUN 8 - 23 mg/dL 8 6(L) 5(L)  Creatinine 0.61 - 1.24 mg/dL 0.82 0.89 0.77  Sodium 135 - 145 mmol/L 134(L) 135 137  Potassium 3.5 - 5.1 mmol/L 4.4 4.5 4.0  Chloride 98 - 111 mmol/L 100 100 102  CO2 22 - 32 mmol/L 26 28 27   Calcium 8.9 - 10.3 mg/dL 8.2(L) 7.9(L) 7.8(L)  Total Protein 6.5 - 8.1 g/dL - - -  Total Bilirubin 0.3 - 1.2 mg/dL - - -  Alkaline Phos 38 - 126 U/L - - -  AST 15 - 41 U/L - - -  ALT 0 - 44 U/L - - -    Imaging studies: No new pertinent imaging studies   Assessment/Plan: (ICD-10's: K50.014) 74 y.o. male with terminal ileitis with abscess s/p percutaneous drainage   - Follow up Cx; transition to PO Abx - recommend about 10 days of PO ABx outpatient  - Continue drain at home; drain teaching provided   - pain control prn  - Follow up with general surgery in 1 week  - Follow up with GI as scheduled   - further management per primary service; okay for discharge from surgical standpoint   All of the above findings and recommendations were discussed with the patient, and the medical team, and all of patient's questions were answered to his expressed satisfaction.  -- Todd Simon, PA-C [facility] 02/28/2019, 8:54 AM [phone] M-F: 7am - 4pm

## 2019-02-28 NOTE — Progress Notes (Signed)
MD ordered patient to be discharged home.  Discharge instructions were reviewed with the patient and he voiced understanding.  Follow-up appointment was made.  Prescriptions sent  to the patients pharmacy.  Dressing change and flushing the drain were taught with the patient and daughter and they voiced understanding. IV was removed with catheter intact.  All patients questions were answered.  Patient left via wheelchair escorted by NT.

## 2019-02-28 NOTE — Discharge Summary (Signed)
Physician Discharge Summary  Todd Briggs:[id number] DOB: [birth year] DOA: 02/21/2019  PCP: Sofie Hartigan, MD  Admit date: 02/21/2019 Discharge date: 02/28/2019  Admitted From: home Disposition:  home  Recommendations for Outpatient Follow-up:  1. Follow up with PCP in 1-2 weeks 2. F/u surgery w/in 1 week 3. F/u GI in 1-2 weeks  Home Health: no Equipment/Devices: abdominal JP drain   Discharge Condition: stable  CODE STATUS: full  Diet recommendation: Heart Healthy   Brief/Interim Summary: HPI was taken from Dr. Mal Misty: Todd Briggs is an 74 y.o. 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  At that time, he was discharged on ciprofloxacin and Flagyl.  He presented to the hospital today because of worsening diarrhea, generalized weakness and abdominal pain.  He describes the pain as "gas pain".  It was quite severe.  It was nonradiating and there was no known relieving or aggravating factors.  Pain was mainly located in the mid abdomen and right lower quadrant area.  He said he had loose watery stools almost every 30 minutes yesterday.  His symptoms were associated with abdominal bloating.  Today, he's only had 2 watery stools.  Stools are nonbloody.  He does not have any fever, chills, nausea, vomiting.   ED Course:  The patient had a CT scan of the abdomen and pelvis which showed persistent changes likely related to inflammatory bowel disease within the distal ileum with interval development of multiple air-fluid collections/abscesses within the right mid and central abdomen.  He was given IV antibiotics, oral and IV potassium and IV fluids in the ED.  He was seen in consultation by the general surgeon who recommended conservative management.  Hospital  Course from Dr. Lenise Herald 02/25/19-02/28/19: Pt was found to have terminal ileitis w/ microabscesses and had IR guided aspiration & drainage placement on 02/23/19. Pt was continue on IV abxs (cefepime & flagyl) while in pt. Pt was d/c home w/ JP drain as per surgery. The abd wound cx grew multiple organisms including aerobic and anaerobic organisms and not sensitivities were given as per micro lab. Pt was sent home w/ po flagyl & cefdinir. Pt will f/u w/ surgery as an outpatient w/in 1 week for possible JP drain removal and further evaluation. 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   Discharge Diagnoses:  Principal Problem:   Ileitis, terminal, with abscess ([place]) Active Problems:   Hypokalemia   Diarrhea   Rupture of bowel ([facility])   Terminal ileitis with microabscesses: transition to po cefdinir & flagyl. S/p IR guided aspiration and a drainage placement on 02/23/2019. Wound cx growing moderate gram positive rods, few gram positive cocci in chains, rare yeast, rare gram neg rods, few bacteroides ovatus. GI panel completely neg. GI and gen surg following and recs apprec  Hypokalemia: WNL again today. Will continue to monitor   Bilateral pleural effusions:  Asymptomatic. Not requiring supplemental oxygen  Right renal mass: pt has a follow-up appointment with urologist.  Leukocytosis: resolved  Thrombocytosis: likely reactive. 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 Will continue iron supplements. No need for a transfusion at this time. Will continue to monitor   Discharge Instructions  Discharge Instructions    Diet - low sodium heart healthy   Complete by: As  directed    Discharge instructions   Complete by: As directed    F/u w/ surgery in 2-3 days; F/u w/ GI in 1 week; F/u w/ PCP in 1-2 weeks   Increase activity slowly   Complete by: As directed      Allergies as of 02/28/2019      Reactions   Penicillins Rash    Occurred as a child      Medication List    STOP taking these medications   ibuprofen 200 MG tablet Commonly known as: ADVIL     TAKE these medications   acetaminophen 325 MG tablet Commonly known as: TYLENOL Take 2 tablets (650 mg total) by mouth every 6 (six) hours as needed for mild pain, moderate pain or fever (or Fever >/= 101).   cefdinir 300 MG capsule Commonly known as: OMNICEF Take 1 capsule (300 mg total) by mouth 2 (two) times daily for 10 days.   cetirizine 10 MG tablet Commonly known as: ZYRTEC Take 10 mg by mouth daily.   ferrous sulfate 325 (65 FE) MG tablet Take 1 tablet (325 mg total) by mouth daily with breakfast. Start taking on: March 01, 2019   metroNIDAZOLE 500 MG tablet Commonly known as: Flagyl Take 1 tablet (500 mg total) by mouth 3 (three) times daily for 10 days.      Follow-up Information    Piscoya, Jacqulyn Bath, MD. Schedule an appointment as soon as possible for a visit in 1 week(s).   Specialty: General Surgery Why: ilieitis with abscess s/p drain placement Contact information: [address] [place] [address] [phone]          Allergies  Allergen Reactions  . Penicillins Rash    Occurred as a child    Consultations:  Surgery (Dr. Olean Ree) & GI (Dr. Bonna Gains)   Procedures/Studies: DG Abdomen 1 View  Result Date: 02/21/2019 CLINICAL DATA:  Abdominal bloating EXAM: ABDOMEN - 1 VIEW COMPARISON:  None. FINDINGS: Mildly distended loops of small bowel in the left upper quadrant. No significant stool burden. IMPRESSION: Mildly distended small bowel loops in the left upper quadrant. Electronically Signed   By: Macy Mis M.D.   On: 02/21/2019 12:43   MR ABDOMEN W WO CONTRAST  Result Date: 02/16/2019 CLINICAL DATA:  Right renal lesion on CT. EXAM: MRI ABDOMEN WITHOUT AND WITH CONTRAST TECHNIQUE: Multiplanar multisequence MR imaging of the abdomen was performed both before and after the administration  of intravenous contrast. CONTRAST:  51m GADAVIST GADOBUTROL 1 MMOL/ML IV SOLN COMPARISON:  CT abdomen/pelvis dated 02/15/2019 FINDINGS: Lower chest: Small bilateral pleural effusions. Associated lower lobe atelectasis. Hepatobiliary: Liver is within normal limits. No suspicious/enhancing hepatic lesions. No hepatic steatosis. Status post cholecystectomy. No intrahepatic or extrahepatic ductal dilatation. Pancreas:  Within normal limits. Spleen:  Within normal limits. Adrenals/Urinary Tract:  Adrenal glands are within normal limits. 1.9 x 2.1 x 1.7 cm solid enhancing mass in the right lower kidney (series 13/image 34), compatible with solid renal neoplasm such as renal cell carcinoma. Left kidney is within normal limits. No hydronephrosis. Stomach/Bowel: Stomach is within normal limits. On coronal HASTE imaging, the terminal ileum is mildly thick-walled although underdistended (series 2/image 21). Visualized bowel is otherwise grossly unremarkable. Vascular/Lymphatic:  No evidence of abdominal aortic aneurysm. Single right renal artery and vein.  No renal vein invasion. 6 mm short axis right para-aortic node inferior to the right renal vein (series 5/image 28), nonspecific. Other: No abdominal ascites. Mild mesenteric edema in the right  mid abdomen (series 5/image 35). Musculoskeletal: No focal osseous lesions. IMPRESSION: 2.1 cm solid enhancing mass in the right lower kidney, compatible with solid renal neoplasm such as renal cell carcinoma. Single right renal artery and vein.  No renal vein invasion. 6 mm short axis right para-aortic node inferior to the right renal vein, nonspecific, technically not pathologically enlarged. Consider attention on follow-up. No findings specific for metastatic disease. 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 Electronically Signed   By: Julian Hy M.D.   On: 02/16/2019 15:16   CT  abd  Result Date: 02/21/2019 CLINICAL DATA:  Abdominal pain and distension EXAM: CT ABDOMEN AND PELVIS WITH CONTRAST TECHNIQUE: Multidetector CT imaging of the abdomen and pelvis was performed using the standard protocol following bolus administration of intravenous contrast. CONTRAST:  159m OMNIPAQUE IOHEXOL 300 MG/ML  SOLN COMPARISON:  02/15/2019 FINDINGS: Lower chest: Small bilateral pleural effusions are noted right greater than left with associated right basilar atelectatic changes. Scattered calcified granulomas are seen. Hepatobiliary: Tiny hypodensity is noted in the dome of the liver posteriorly best seen on image number 9 of series 2 stable from the prior exam. A few scattered hypodensities are seen stable from the prior study. Status post cholecystectomy. No biliary dilatation. Pancreas: Unremarkable. No pancreatic ductal dilatation or surrounding inflammatory changes. Spleen: Normal in size without focal abnormality. Adrenals/Urinary Tract: Adrenal glands are stable with a small right adrenal nodule identified. Normal enhancement of the kidneys is seen with the exception of a right lower pole mildly enhancing lesion which is stable from the prior exam. No obstructive changes are seen. The bladder is partially distended. Stomach/Bowel: Colon shows no obstructive changes. Some surrounding inflammatory changes noted related to the small bowel inflammatory change. A small amount of free fluid is noted within the pelvis which is new from the prior exam. No free air is seen. Diffuse inflammatory changes of the distal ileum are again identified with multiple diverticuli is some which contain dense material likely related to ingested content. The overall appearance is stable from the prior exam with the exception of a new air-fluid collection identified in the right mid abdomen. The fluid component measures approximately 2.7 x 2.4 cm in greatest dimension. It extends to an area of mottled extraluminal air  best seen on image number 60 of series 2. A small adjacent air-fluid collection is noted best seen on image number 57 of series 2. These are consistent with small abscesses likely related to a micro perforation. An additional small air-fluid collection is noted in the right mid abdomen on image number 52 of series 2. Stomach is within normal limits. The proximal small bowel is unremarkable. Vascular/Lymphatic: Duplicated IVC is noted. Aortic calcifications are seen. No significant lymphadenopathy is noted. Reproductive: Prostate is unremarkable. Other: Free fluid is noted within the pelvis consistent with an interval perforation. No significant free air is noted. Previously described abscesses are noted. Musculoskeletal: Degenerative changes of lumbar spine are seen. No acute bony abnormality is noted. IMPRESSION: Persistent changes likely related to inflammatory bowel disease within the distal ileum. There has been interval development of multiple air-fluid collections within the right mid and central abdomen. The largest of these measures approximately 2.7 x 2.4 cm as described above. The air-fluid collections appear to inter communicate enter likely related to rupture of a small ileal diverticulum. Mild free fluid in the pelvis is noted. Bilateral pleural effusions with right basilar atelectasis. The remainder of the exam  is stable from the prior study. Electronically Signed   By: Inez Catalina M.D.   On: 02/21/2019 16:08   CT Abdomen Pelvis W Contrast  Result Date: 02/15/2019 CLINICAL DATA:  Abdominal pain since last evening. History of irritable bowel syndrome. EXAM: CT ABDOMEN AND PELVIS WITH CONTRAST TECHNIQUE: Multidetector CT imaging of the abdomen and pelvis was performed using the standard protocol following bolus administration of intravenous contrast. CONTRAST:  185m OMNIPAQUE IOHEXOL 300 MG/ML  SOLN COMPARISON:  None. FINDINGS: Lower chest: Bibasilar scarring and subpleural atelectasis. No  worrisome pulmonary lesions or pleural effusion. The heart is normal in size. No pericardial effusion. Hepatobiliary: A few tiny low-attenuation lesions are likely benign cysts. The gallbladder is surgically absent. There is mild associated intra and extrahepatic biliary dilatation. Pancreas: No mass, inflammation or ductal dilatation. Spleen: Normal size.  No focal lesions. Adrenals/Urinary Tract: Small right adrenal gland nodule, unchanged since a chest CT from 2018 and consistent with a benign adenoma. The left adrenal gland is unremarkable. There is an indeterminate lesion in the lower pole region of the right kidney measuring approximately 21.5 mm. Findings suspicious for a small solid renal neoplasm. A hyperdense/hemorrhagic cyst is possible but this will need further evaluation with MRI. The left kidney is unremarkable. Stomach/Bowel: The stomach is unremarkable. The duodenum and proximal loops of jejunum appear relatively normal. Mild dilatation of the more distal jejunal loops with scattered air-fluid levels. Severe inflammatory or infectious ileitis is noted. There is marked mucosal and serosal enhancement and submucosal edema. The distal loops of ileum are very irregular and some of them demonstrate straightening and areas of traction. There also multiple ileal diverticuli. Some fibrofatty infiltrated type changes are suggestive of acute on chronic ileitis. The terminal ileum is also abnormal. Recommend GI consultation as patient could have Crohn's disease. This could be causing a mild functional obstruction but I do not see an obvious mechanical obstruction. No evidence of fistula or abscess. The colon is relatively decompressed. No colonic inflammatory process. Vascular/Lymphatic: Scattered aortic calcifications but no aneurysm or dissection. The branch vessels are patent. The major venous structures are patent. 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 Small scattered mesenteric and retroperitoneal lymph  nodes likely associated with the small bowel inflammatory process. There is also mild mesenteric edema and a small amount of mesenteric fluid. Reproductive: The prostate gland is slightly enlarged. The seminal vesicles appear normal. Other: No free pelvic fluid collections, pelvic abscess or adenopathy. No inguinal mass or adenopathy. Musculoskeletal: No significant bony findings. Moderate lower lumbar degenerative disc disease and facet disease. IMPRESSION: 1. Fairly extensive inflammatory or infectious ileitis as detailed above. I suspect this is acute on chronic disease. Crohn's disease is certainly a possibility. This may be causing a mild functional obstruction with dilated bowel but no mechanical obstruction is demonstrated. Patient may need GI consultation. 2. Indeterminate 2 cm lower pole right renal lesion worrisome for solid renal neoplasm. Recommend MRI abdomen without and with contrast for further evaluation. Patient may need urology consultation. 3. Scattered ileal diverticuli but no findings for diverticulitis. 4. Status post cholecystectomy.  No biliary dilatation. Electronically Signed   By: PMarijo SanesM.D.   On: 02/15/2019 08:22   CT IMAGE GUIDED DRAINAGE BY PERCUTANEOUS CATHETER  Result Date: 02/23/2019 INDICATION: Concern for inflammatory bowel disease, now with enteric perforation. Please perform CT-guided aspiration and/or drainage catheter placement for infection source control purposes. EXAM: CT IMAGE GUIDED DRAINAGE BY PERCUTANEOUS CATHETER COMPARISON:  CT abdomen pelvis-02/21/2019; 02/15/2019 MEDICATIONS: The patient is  currently admitted to the hospital and receiving intravenous antibiotics. The antibiotics were administered within an appropriate time frame prior to the initiation of the procedure. ANESTHESIA/SEDATION: Moderate (conscious) sedation was employed during this procedure. A total of Versed 2 mg and Fentanyl 100 mcg was administered intravenously. Moderate Sedation Time:  11 minutes. The patient's level of consciousness and vital signs were monitored continuously by radiology nursing throughout the procedure under my direct supervision. CONTRAST:  None COMPLICATIONS: None immediate. PROCEDURE: Informed written consent was obtained from the patient after a discussion of the risks, benefits and alternatives to treatment. The patient was placed supine on the CT gantry and a pre procedural CT was performed re-demonstrating the known interloop abscess/fluid collection within the right lower abdomen/pelvis with dominant bilobed serpiginous component measuring approximately 6.5 x 2.3 cm (image 26, series 2). The procedure was planned. A timeout was performed prior to the initiation of the procedure. The skin overlying the anterolateral aspect of the right lower abdomen was prepped and draped in the usual sterile fashion. The overlying soft tissues were anesthetized with 1% lidocaine with epinephrine. Appropriate trajectory was planned with the use of a 22 gauge spinal needle. An 18 gauge trocar needle was advanced into the abscess/fluid collection and a short Amplatz super stiff wire was coiled within the collection. Appropriate positioning was confirmed with a limited CT scan. The tract was serially dilated allowing placement of a 10 [place] all-purpose drainage catheter. Appropriate positioning was confirmed with a limited postprocedural CT scan. Approximately 35 ml of purulent fluid was aspirated. The tube was connected to a JP bulb and sutured in place. A dressing was placed. The patient tolerated the procedure well without immediate post procedural complication. IMPRESSION: Successful CT guided placement of a 10 French all purpose drain catheter into the interloop abscess within the right lower abdomen/pelvis with aspiration of 35 mL of purulent fluid. Samples were sent to the laboratory as requested by the ordering clinical team. Electronically Signed   By: Sandi Mariscal M.D.   On:  02/23/2019 12:30      Subjective:  Pt c/o mild abd pain    Discharge Exam: Vitals:   02/27/19 2235 02/28/19 0505  BP: (!) 176/91 (!) 147/91  Pulse: 72 80  Resp: 16 16  Temp: 97.8 F (36.6 C) 98.1 F (36.7 C)  SpO2: 98% 96%   Vitals:   02/27/19 1323 02/27/19 2235 02/28/19 0500 02/28/19 0505  BP: 139/85 (!) 176/91  (!) 147/91  Pulse: 75 72  80  Resp: 18 16  16   Temp: (!) 97.5 F (36.4 C) 97.8 F (36.6 C)  98.1 F (36.7 C)  TempSrc: Oral Oral  Oral  SpO2: 96% 98%  96%  Weight:   69.8 kg   Height:        General: Pt is alert, awake, not in acute distress Cardiovascular: S1/S2 +, no rubs, no gallops Respiratory: CTA bilaterally, no wheezing, no rhonchi Abdominal: Soft, mild tenderness to palpation, ND, bowel sounds + Extremities: no edema, no cyanosis    The results of significant diagnostics from this hospitalization (including imaging, microbiology, ancillary and laboratory) are listed below for reference.     Microbiology: Recent Results (from the past 240 hour(s))  GI pathogen panel by PCR, stool     Status: None   Collection Time: 02/21/19  4:48 PM   Specimen: Stool  Result Value Ref Range Status   Plesiomonas shigelloides NOT DETECTED NOT DETECTED Final   Yersinia enterocolitica NOT DETECTED NOT DETECTED Final  Vibrio NOT DETECTED NOT DETECTED Final   Enteropathogenic E coli NOT DETECTED NOT DETECTED Final   E coli (ETEC) LT/ST NOT DETECTED NOT DETECTED Final   E coli 5643 by PCR Not applicable NOT DETECTED Final   Cryptosporidium by PCR NOT DETECTED NOT DETECTED Final   Entamoeba histolytica NOT DETECTED NOT DETECTED Final   Adenovirus F 40/41 NOT DETECTED NOT DETECTED Final   Norovirus GI/GII NOT DETECTED NOT DETECTED Final   Sapovirus NOT DETECTED NOT DETECTED Final    Comment: (NOTE) Performed At: [facility] [place], [place] [id number] Rush Farmer MD YS:[id number]    Vibrio cholerae NOT DETECTED NOT DETECTED Final    Campylobacter by PCR NOT DETECTED NOT DETECTED Final   Salmonella by PCR NOT DETECTED NOT DETECTED Final   E coli (STEC) NOT DETECTED NOT DETECTED Final   Enteroaggregative E coli NOT DETECTED NOT DETECTED Final   Shigella by PCR NOT DETECTED NOT DETECTED Final   Cyclospora cayetanensis NOT DETECTED NOT DETECTED Final   Astrovirus NOT DETECTED NOT DETECTED Final   G lamblia by PCR NOT DETECTED NOT DETECTED Final   Rotavirus A by PCR NOT DETECTED NOT DETECTED Final  C Difficile Quick Screen w PCR reflex     Status: None   Collection Time: 02/21/19  4:48 PM   Specimen: Stool  Result Value Ref Range Status   C Diff antigen NEGATIVE NEGATIVE Final   C Diff toxin NEGATIVE NEGATIVE Final   C Diff interpretation No C. difficile detected.  Final    Comment: Performed at [facility], [place], [place] [address]  SARS CORONAVIRUS 2 (TAT 6-24 HRS) Nasopharyngeal Nasopharyngeal Swab     Status: None   Collection Time: 02/21/19  6:15 PM   Specimen: Nasopharyngeal Swab  Result Value Ref Range Status   SARS Coronavirus 2 NEGATIVE NEGATIVE Final    Comment: (NOTE) SARS-CoV-2 target nucleic acids are NOT DETECTED. The SARS-CoV-2 RNA is generally detectable in upper and lower respiratory specimens during the acute phase of infection. Negative results do not preclude SARS-CoV-2 infection, do not rule out co-infections with other pathogens, and should not be used as the sole basis for treatment or other patient management decisions. Negative results must be combined with clinical observations, patient history, and epidemiological information. The expected result is Negative. Fact Sheet for Patients: SugarRoll.be Fact Sheet for Healthcare Providers: https://www.woods-mathews.com/ This test is not yet approved or cleared by the [place] FDA and  has been authorized for detection and/or diagnosis of SARS-CoV-2 by FDA under an  Emergency Use Authorization (EUA). This EUA will remain  in effect (meaning this test can be used) for the duration of the COVID-19 declaration under Section 56 4(b)(1) of the Act, 21 U.S.C. section 360bbb-3(b)(1), unless the authorization is terminated or revoked sooner. Performed at [facility], [place] [address], [place], [place] [address]   Aerobic/Anaerobic Culture (surgical/deep wound)     Status: Abnormal   Collection Time: 02/23/19 11:30 AM   Specimen: Wound; Abscess  Result Value Ref Range Status   Specimen Description   Final    WOUND Performed at [facility], [address], [place],  [address]    Special Requests   Final    Normal Performed at [facility], [address] [place], [place] [address]    Gram Stain   Final    MODERATE WBC PRESENT, PREDOMINANTLY PMN MODERATE GRAM POSITIVE RODS FEW GRAM POSITIVE COCCI IN  CHAINS RARE YEAST RARE GRAM NEGATIVE RODS    Culture (A)  Final    MULTIPLE ORGANISMS PRESENT, NONE PREDOMINANT FEW BACTEROIDES OVATUS BETA LACTAMASE POSITIVE Performed at [facility], [place] [address], [place], [place] [address]    Report Status 02/27/2019 FINAL  Final  CULTURE, BLOOD (ROUTINE X 2) w Reflex to ID Panel     Status: None   Collection Time: 02/23/19  2:14 PM   Specimen: BLOOD  Result Value Ref Range Status   Specimen Description BLOOD RIGHT ANTECUBITAL  Final   Special Requests   Final    BOTTLES DRAWN AEROBIC AND ANAEROBIC Blood Culture adequate volume   Culture   Final    NO GROWTH 5 DAYS Performed at [facility], [address] [place], [place] [address]    Report Status 02/28/2019 FINAL  Final  CULTURE, BLOOD (ROUTINE X 2) w Reflex to ID Panel     Status: None   Collection Time: 02/23/19  2:22 PM   Specimen: BLOOD  Result Value Ref Range Status   Specimen Description BLOOD BLOOD RIGHT HAND  Final   Special Requests   Final    BOTTLES DRAWN AEROBIC AND ANAEROBIC Blood  Culture adequate volume   Culture   Final    NO GROWTH 5 DAYS Performed at [facility], [address], [place], [place] [address]    Report Status 02/28/2019 FINAL  Final     Labs: BNP (last 3 results) No results for input(s): BNP in the last 8760 hours. Basic Metabolic Panel: Recent Labs  Lab 02/22/19 0539 02/24/19 0453 02/25/19 0506 02/26/19 0457 02/27/19 0556 02/28/19 0548  NA 139 137 138 137 135 134*  K 3.0* 3.6 3.6 4.0 4.5 4.4  CL 100 101 102 102 100 100  CO2 28 27 26 27 28 26   GLUCOSE 97 117* 102* 108* 113* 98  BUN 10 8 6* 5* 6* 8  CREATININE 0.99 0.78 0.70 0.77 0.89 0.82  CALCIUM 7.1* 7.2* 7.4* 7.8* 7.9* 8.2*  MG 2.0  --   --   --   --   --    Liver Function Tests: No results for input(s): AST, ALT, ALKPHOS, BILITOT, PROT, ALBUMIN in the last 168 hours. No results for input(s): LIPASE, AMYLASE in the last 168 hours. No results for input(s): AMMONIA in the last 168 hours. CBC: Recent Labs  Lab 02/24/19 0453 02/25/19 0506 02/26/19 0457 02/27/19 0556 02/28/19 0548  WBC 14.2* 12.3* 12.3* 9.6 10.3  NEUTROABS 11.9* 10.0* 10.1* 7.4 7.7  HGB 11.6* 12.2* 11.8* 12.3* 12.5*  HCT 33.5* 35.1* 36.1* 37.9* 36.2*  MCV 79.4* 79.2* 83.2 83.5 79.7*  PLT 440* 515* 567* 563* 596*   Cardiac Enzymes: No results for input(s): CKTOTAL, CKMB, CKMBINDEX, TROPONINI in the last 168 hours. BNP: Invalid input(s): POCBNP CBG: No results for input(s): GLUCAP in the last 168 hours. D-Dimer No results for input(s): DDIMER in the last 72 hours. Hgb A1c No results for input(s): HGBA1C in the last 72 hours. Lipid Profile No results for input(s): CHOL, HDL, LDLCALC, TRIG, CHOLHDL, LDLDIRECT in the last 72 hours. Thyroid function studies No results for input(s): TSH, T4TOTAL, T3FREE, THYROIDAB in the last 72 hours.  Invalid input(s): FREET3 Anemia work up Recent Labs    02/26/19 0457  FERRITIN 190  TIBC 179*  IRON 29*   Urinalysis    Component Value Date/Time    COLORURINE AMBER (A) 02/21/2019 0900   APPEARANCEUR HAZY (A) 02/21/2019 0900  LABSPEC 1.025 02/21/2019 0900   PHURINE 6.0 02/21/2019 0900   GLUCOSEU NEGATIVE 02/21/2019 0900   HGBUR SMALL (A) 02/21/2019 0900   BILIRUBINUR NEGATIVE 02/21/2019 0900   KETONESUR 20 (A) 02/21/2019 0900   PROTEINUR 30 (A) 02/21/2019 0900   NITRITE NEGATIVE 02/21/2019 0900   LEUKOCYTESUR SMALL (A) 02/21/2019 0900   Sepsis Labs Invalid input(s): PROCALCITONIN,  WBC,  LACTICIDVEN Microbiology Recent Results (from the past 240 hour(s))  GI pathogen panel by PCR, stool     Status: None   Collection Time: 02/21/19  4:48 PM   Specimen: Stool  Result Value Ref Range Status   Plesiomonas shigelloides NOT DETECTED NOT DETECTED Final   Yersinia enterocolitica NOT DETECTED NOT DETECTED Final   Vibrio NOT DETECTED NOT DETECTED Final   Enteropathogenic E coli NOT DETECTED NOT DETECTED Final   E coli (ETEC) LT/ST NOT DETECTED NOT DETECTED Final   E coli 8032 by PCR Not applicable NOT DETECTED Final   Cryptosporidium by PCR NOT DETECTED NOT DETECTED Final   Entamoeba histolytica NOT DETECTED NOT DETECTED Final   Adenovirus F 40/41 NOT DETECTED NOT DETECTED Final   Norovirus GI/GII NOT DETECTED NOT DETECTED Final   Sapovirus NOT DETECTED NOT DETECTED Final    Comment: (NOTE) Performed At: [facility] [place], [place] [id number] Rush Farmer MD BB:[id number]    Vibrio cholerae NOT DETECTED NOT DETECTED Final   Campylobacter by PCR NOT DETECTED NOT DETECTED Final   Salmonella by PCR NOT DETECTED NOT DETECTED Final   E coli (STEC) NOT DETECTED NOT DETECTED Final   Enteroaggregative E coli NOT DETECTED NOT DETECTED Final   Shigella by PCR NOT DETECTED NOT DETECTED Final   Cyclospora cayetanensis NOT DETECTED NOT DETECTED Final   Astrovirus NOT DETECTED NOT DETECTED Final   G lamblia by PCR NOT DETECTED NOT DETECTED Final   Rotavirus A by PCR NOT DETECTED NOT DETECTED Final  C Difficile  Quick Screen w PCR reflex     Status: None   Collection Time: 02/21/19  4:48 PM   Specimen: Stool  Result Value Ref Range Status   C Diff antigen NEGATIVE NEGATIVE Final   C Diff toxin NEGATIVE NEGATIVE Final   C Diff interpretation No C. difficile detected.  Final    Comment: Performed at [facility], [place], [place] [address]  SARS CORONAVIRUS 2 (TAT 6-24 HRS) Nasopharyngeal Nasopharyngeal Swab     Status: None   Collection Time: 02/21/19  6:15 PM   Specimen: Nasopharyngeal Swab  Result Value Ref Range Status   SARS Coronavirus 2 NEGATIVE NEGATIVE Final    Comment: (NOTE) SARS-CoV-2 target nucleic acids are NOT DETECTED. The SARS-CoV-2 RNA is generally detectable in upper and lower respiratory specimens during the acute phase of infection. Negative results do not preclude SARS-CoV-2 infection, do not rule out co-infections with other pathogens, and should not be used as the sole basis for treatment or other patient management decisions. Negative results must be combined with clinical observations, patient history, and epidemiological information. The expected result is Negative. Fact Sheet for Patients: SugarRoll.be Fact Sheet for Healthcare Providers: https://www.woods-mathews.com/ This test is not yet approved or cleared by the [place] FDA and  has been authorized for detection and/or diagnosis of SARS-CoV-2 by FDA under an Emergency Use Authorization (EUA). This EUA will remain  in effect (meaning this test can be used) for the duration of the COVID-19 declaration under Section 56 4(b)(1) of the Act, 21 U.S.C.  section 360bbb-3(b)(1), unless the authorization is terminated or revoked sooner. Performed at [facility], [place] [address] [place], [place] [address]   Aerobic/Anaerobic Culture (surgical/deep wound)     Status: Abnormal   Collection Time: 02/23/19 11:30 AM   Specimen: Wound; Abscess   Result Value Ref Range Status   Specimen Description   Final    WOUND Performed at [facility], [address] [place], [place] [address]    Special Requests   Final    Normal Performed at [facility], [address] [place], [place] [address]    Gram Stain   Final    MODERATE WBC PRESENT, PREDOMINANTLY PMN MODERATE GRAM POSITIVE RODS FEW GRAM POSITIVE COCCI IN CHAINS RARE YEAST RARE GRAM NEGATIVE RODS    Culture (A)  Final    MULTIPLE ORGANISMS PRESENT, NONE PREDOMINANT FEW BACTEROIDES OVATUS BETA LACTAMASE POSITIVE Performed at [facility], [place] [address], [place], [place] [address]    Report Status 02/27/2019 FINAL  Final  CULTURE, BLOOD (ROUTINE X 2) w Reflex to ID Panel     Status: None   Collection Time: 02/23/19  2:14 PM   Specimen: BLOOD  Result Value Ref Range Status   Specimen Description BLOOD RIGHT ANTECUBITAL  Final   Special Requests   Final    BOTTLES DRAWN AEROBIC AND ANAEROBIC Blood Culture adequate volume   Culture   Final    NO GROWTH 5 DAYS Performed at [facility], [address] [place], [place] [address]    Report Status 02/28/2019 FINAL  Final  CULTURE, BLOOD (ROUTINE X 2) w Reflex to ID Panel     Status: None   Collection Time: 02/23/19  2:22 PM   Specimen: BLOOD  Result Value Ref Range Status   Specimen Description BLOOD BLOOD RIGHT HAND  Final   Special Requests   Final    BOTTLES DRAWN AEROBIC AND ANAEROBIC Blood Culture adequate volume   Culture   Final    NO GROWTH 5 DAYS Performed at [facility], [address], [place],  [address]    Report Status 02/28/2019 FINAL  Final     Time coordinating discharge: Over 30 minutes  SIGNED:   Wyvonnia Dusky, MD  [facility] 02/28/2019, 10:59 AM Pager   If 7PM-7AM, please contact night-coverage [email or web address] Password [email or web address]

## 2019-03-07 ENCOUNTER — Encounter: Payer: Self-pay | Admitting: Surgery

## 2019-03-07 ENCOUNTER — Ambulatory Visit: Payer: Medicare HMO | Admitting: Surgery

## 2019-03-07 ENCOUNTER — Other Ambulatory Visit: Payer: Self-pay

## 2019-03-07 VITALS — BP 131/74 | HR 72 | Temp 96.6°F | Resp 14 | Ht 70.0 in | Wt 155.0 lb

## 2019-03-07 DIAGNOSIS — K50014 Crohn's disease of small intestine with abscess: Secondary | ICD-10-CM

## 2019-03-07 MED ORDER — SALINE FLUSH 0.9 % IV SOLN: 5.0000 mL | Freq: Two times a day (BID) | INTRAVENOUS | 0 refills | Status: DC

## 2019-03-07 NOTE — Progress Notes (Signed)
03/07/2019  History of Present Illness: Todd Briggs is a 74 y.o. male presenting for follow up of ileitis with intraloop abscess.  He was initially in the hospital on 12/22 with ileitis and a new right renal mass.  He was on antibiotics and discharged home with GI and Urology follow up.  He was then readmitted on 12/28 with again worsening pain and CT scan showing ileitis again, but now with developed abscesses.  He was able to get a percutaneous drain by IR on 12/30.  He was discharged on 1/4 with antibiotic course and his drain in place.  He reports today that he has been having issues with worse diarrhea compared to his chronic diarrhea at baseline.  He think it is related to the antibiotics.  He denies any worsening pain, and denies any fevers, chills.  Reports only soreness at the drain insertion site.  The drain output has been low per day.  Past Medical History: Past Medical History:  Diagnosis Date  . Allergy    Seasonal  . Anemia   . Arthritis   . Basal cell carcinoma    Removed 1980's  . Cataracts, both eyes   . Diabetes mellitus without complication (HCC)    Borderline  . GERD (gastroesophageal reflux disease)   . IBS (irritable bowel syndrome)   . Peptic ulcer 74 years old  . Scarlet fever 74 years old  . Shortness of breath dyspnea    with exertion     Past Surgical History: Past Surgical History:  Procedure Laterality Date  . CHOLECYSTECTOMY N/A 03/30/2015   Procedure: LAPAROSCOPIC CHOLECYSTECTOMY;  Surgeon: Hubbard Robinson, MD;  Location: [facility] ORS;  Service: General;  Laterality: N/A;  . HERNIA REPAIR Bilateral 74 years old   Inguinal Hernia    Home Medications: Prior to Admission medications   Medication Sig Start Date End Date Taking? Authorizing Provider  acetaminophen (TYLENOL) 325 MG tablet Take 2 tablets (650 mg total) by mouth every 6 (six) hours as needed for mild pain, moderate pain or fever (or Fever >/= 101). 02/28/19 03/30/19 Yes Wyvonnia Dusky, MD  cefdinir (OMNICEF) 300 MG capsule Take 1 capsule (300 mg total) by mouth 2 (two) times daily for 10 days. 02/28/19 03/10/19 Yes Wyvonnia Dusky, MD  cetirizine (ZYRTEC) 10 MG tablet Take 10 mg by mouth daily.   Yes [provider]  ferrous sulfate 325 (65 FE) MG tablet Take 1 tablet (325 mg total) by mouth daily with breakfast. 03/01/19 03/31/19 Yes Wyvonnia Dusky, MD  metroNIDAZOLE (FLAGYL) 500 MG tablet Take 1 tablet (500 mg total) by mouth 3 (three) times daily for 10 days. 02/28/19 03/10/19 Yes Wyvonnia Dusky, MD  Sodium Chloride Flush (SALINE FLUSH) 0.9 % SOLN Inject 5 mLs into the vein 2 (two) times daily. 03/07/19   Olean Ree, MD    Allergies: Allergies  Allergen Reactions  . Penicillins Rash    Occurred as a child    Review of Systems: Review of Systems  Constitutional: Negative for chills and fever.  Respiratory: Negative for shortness of breath.   Cardiovascular: Negative for chest pain.  Gastrointestinal: Positive for diarrhea. Negative for abdominal pain, nausea and vomiting.    Physical Exam BP 131/74   Pulse 72   Temp (!) 96.6 F (35.9 C) (Temporal)   Resp 14   Ht 5' 10"  (1.778 m)   Wt 155 lb (70.3 kg)   SpO2 95%   BMI 22.24 kg/m  CONSTITUTIONAL: No acute  distress HEENT:  Normocephalic, atraumatic, extraocular motion intact. RESPIRATORY:  Lungs are clear, and breath sounds are equal bilaterally. Normal respiratory effort without pathologic use of accessory muscles. CARDIOVASCULAR: Heart is regular without murmurs, gallops, or rubs. GI: The abdomen is soft, non-distended, with only soreness at the drain insertion site in the RLQ.  No peritonitis.  The drain is in place and bulb to suction, but there is purulent fluid still in the bulb. NEUROLOGIC:  Motor and sensation is grossly normal.  Cranial nerves are grossly intact. PSYCH:  Alert and oriented to person, place and time. Affect is normal.  Labs/Imaging: None recently  Assessment and  Plan: This is a 74 y.o. male with ileitis with abscess, s/p IR drainage.  --Discussed with the patient that my worry is that there is still residual abscess that the drain is working to drain out.  One possibility is that there are undrained collections still.  We are ordering a CT scan of abdomen and pelvis to evaluate his ileitis and check for any residual fluid.  If there are undrained collections, I would discuss with IR for possible percutaneous drain placement again.  If there are no undrained collections, then we would see him in a week to check on the drain and see if it can be removed. --CT scan abd/pelvis with contrast has been scheduled for 1/13.  I will call him with the results. --Will give him new prescription for saline flushes. --He still has a few more days of his antibiotic course.  Recommended that he continue them until the courses are completed.  Face-to-face time spent with the patient and care providers was 25 minutes, with more than 50% of the time spent counseling, educating, and coordinating care of the patient.     Melvyn Neth, [facility]

## 2019-03-07 NOTE — Patient Instructions (Addendum)
CT scan scheduled 03/09/2019 @ 10:15 at [facility].   You will need to go to pick up your prep kit today in Radiology.  Please do not eat or drink 4 hours prior to having the scan.  Please see your follow up appointment listed below.   Please see your follow up appointment listed below. Please continue to flush the drain twice daily using 5 ml per flush.

## 2019-03-09 ENCOUNTER — Other Ambulatory Visit: Payer: Self-pay

## 2019-03-09 ENCOUNTER — Ambulatory Visit
Admission: RE | Admit: 2019-03-09 | Discharge: 2019-03-09 | Disposition: A | Discharge status: Home / Self Care | Payer: Medicare HMO | Source: Ambulatory Visit | Attending: Surgery | Admitting: Surgery

## 2019-03-09 DIAGNOSIS — K50014 Crohn's disease of small intestine with abscess: Secondary | ICD-10-CM | POA: Insufficient documentation

## 2019-03-09 MED ORDER — IOHEXOL 300 MG/ML  SOLN
100.0000 mL | Freq: Once | INTRAMUSCULAR | Status: AC | PRN
  Administered 2019-03-09: 100 mL via INTRAVENOUS

## 2019-03-10 ENCOUNTER — Telehealth: Payer: Self-pay | Admitting: *Deleted

## 2019-03-10 NOTE — Telephone Encounter (Signed)
Patient called and wants to know results of his CT scan that he had done yesterday

## 2019-03-14 ENCOUNTER — Other Ambulatory Visit: Payer: Self-pay

## 2019-03-14 ENCOUNTER — Encounter: Payer: Self-pay | Admitting: Surgery

## 2019-03-14 ENCOUNTER — Ambulatory Visit (INDEPENDENT_AMBULATORY_CARE_PROVIDER_SITE_OTHER): Payer: Medicare HMO | Admitting: Surgery

## 2019-03-14 ENCOUNTER — Ambulatory Visit: Payer: Medicare HMO | Admitting: Surgery

## 2019-03-14 VITALS — BP 156/83 | HR 77 | Temp 97.9°F | Resp 14 | Ht 70.0 in | Wt 156.8 lb

## 2019-03-14 DIAGNOSIS — K50014 Crohn's disease of small intestine with abscess: Secondary | ICD-10-CM | POA: Diagnosis not present

## 2019-03-14 NOTE — Patient Instructions (Signed)
Your drain was removed today by Thedore Mins, PA. Gauze and tegaderm are placed on the area. Please keep tegaderm on for 2 days, may removed on Wednesday 03/16/19. May take Imodium for your diarrhea. If you have any fevers or belly pain please call the office.  Follow up with [place] as needed.

## 2019-03-14 NOTE — Progress Notes (Signed)
[facility] NOTE  03/14/2019  History of Present Illness: Todd Briggs is a 74 y.o. male who was admitted to [facility] 12/28 - 01/04 for ileitis with abscess with percutaneous drain placement. He was sent home on 10 days of flagyl and cefdinir as well.   Since discharge, he reports that he has been doing well. No issues with fever, chills, nausea, emesis, or urinary changes. He has been tolerating PO without problems. His biggest complaint is diarrhea. This occurs daily. It has improved some since completing his ABx course. Otherwise no additional complaints. Repeat CT scan on 01/13 showed improvement in small bowel thickening and resolution of intra-abdominal fluid collections. Drain has had less than 10 ccs a day for"a while now" and the output has become more clear and improved per the patient. He is set to follow up with GI on Wednesday.    Past Medical History: Past Medical History:  Diagnosis Date  . Allergy    Seasonal  . Anemia   . Arthritis   . Basal cell carcinoma    Removed 1980's  . Cataracts, both eyes   . Diabetes mellitus without complication (HCC)    Borderline  . GERD (gastroesophageal reflux disease)   . IBS (irritable bowel syndrome)   . Peptic ulcer 74 years old  . Scarlet fever 74 years old  . Shortness of breath dyspnea    with exertion     Past Surgical History: Past Surgical History:  Procedure Laterality Date  . CHOLECYSTECTOMY N/A 03/30/2015   Procedure: LAPAROSCOPIC CHOLECYSTECTOMY;  Surgeon: Hubbard Robinson, MD;  Location: [facility] ORS;  Service: General;  Laterality: N/A;  . HERNIA REPAIR Bilateral 74 years old   Inguinal Hernia    Home Medications: Prior to Admission medications   Medication Sig Start Date End Date Taking? Authorizing Provider  acetaminophen (TYLENOL) 325 MG tablet Take 2 tablets (650 mg total) by mouth every 6 (six) hours as needed for mild pain, moderate pain or fever (or Fever >/= 101). 02/28/19  03/30/19  Wyvonnia Dusky, MD  cetirizine (ZYRTEC) 10 MG tablet Take 10 mg by mouth daily.    [provider]  ferrous sulfate 325 (65 FE) MG tablet Take 1 tablet (325 mg total) by mouth daily with breakfast. 03/01/19 03/31/19  Wyvonnia Dusky, MD  Sodium Chloride Flush (SALINE FLUSH) 0.9 % SOLN Inject 5 mLs into the vein 2 (two) times daily. 03/07/19   Olean Ree, MD    Allergies: Allergies  Allergen Reactions  . Penicillins Rash    Occurred as a child    Review of Systems: Review of Systems  Constitutional: Negative for chills and fever.  HENT: Negative for congestion and sore throat.   Respiratory: Negative for cough and shortness of breath.   Cardiovascular: Negative for chest pain and palpitations.  Gastrointestinal: Positive for diarrhea. Negative for abdominal pain, constipation, nausea and vomiting.  Genitourinary: Negative for dysuria and urgency.  All other systems reviewed and are negative.    Physical Exam There were no vitals taken for this visit.  Physical Exam Vitals and nursing note reviewed. Exam conducted with a chaperone present.  Constitutional:      General: He is not in acute distress.    Appearance: Normal appearance. He is normal weight. He is not ill-appearing.  HENT:     Head: Normocephalic and atraumatic.  Eyes:     General: No scleral icterus.    Conjunctiva/sclera: Conjunctivae normal.  Pulmonary:  Effort: Pulmonary effort is normal. No respiratory distress.  Abdominal:     General: Abdomen is flat. There is no distension.     Palpations: Abdomen is soft.     Tenderness: There is no abdominal tenderness. There is no guarding or rebound.    Genitourinary:    Comments: Deferred Skin:    General: Skin is warm and dry.     Coloration: Skin is not pale.     Findings: No erythema.  Neurological:     General: No focal deficit present.     Mental Status: He is alert and oriented to person, place, and time.  Psychiatric:         Mood and Affect: Mood normal.        Behavior: Behavior normal.      Labs/Imaging:  CT Abdomen/Pelvis (03/09/2019) personally reviewed showing improvement in small bowel thickening and no identifiably intra-abdominal fluid collections, and radiologist report reviewed below:  IMPRESSION: 1. Decreased distal small bowel wall thickening and mesenteric inflammatory changes in right lower quadrant. 2. No residual right lower quadrant fluid collections following percutaneous drainage catheter placement 3. Stable 2.1 cm enhancing mass in lower pole of right kidney, consistent with renal cell carcinoma. No evidence of metastatic Disease.   Assessment and Plan: This is a 74 y.o. male with terminal ileitis with abscess s/p percutaneous drainage on 12/30.    - Drain removed in clinic  - Reviewed signs/symptoms of recurring infection  - Okay for OTC anti-diarrheals  - Follow up with GI (Dr Allen Norris, MD) as scheduled on 01/20 to discuss future colonoscopy  - Follow up with urology (Dr Diamantina Providence, MD) as scheduled on 01/27 for renal mass  - Return to surgery clinic prn  Patient seen and examined with Dr Dahlia Byes and plan discussed and agreed upon.    Face-to-face time spent with the patient and care providers was 15 minutes, with more than 50% of the time spent counseling, educating, and coordinating care of the patient.     Edison Simon, PA-C [facility] 03/14/2019, 10:10 AM [phone] M-F: 7am - 4pm

## 2019-03-16 ENCOUNTER — Other Ambulatory Visit: Payer: Self-pay

## 2019-03-16 ENCOUNTER — Telehealth: Payer: Self-pay | Admitting: Gastroenterology

## 2019-03-16 ENCOUNTER — Ambulatory Visit (INDEPENDENT_AMBULATORY_CARE_PROVIDER_SITE_OTHER): Payer: Medicare HMO | Admitting: Gastroenterology

## 2019-03-16 ENCOUNTER — Encounter: Payer: Self-pay | Admitting: Gastroenterology

## 2019-03-16 VITALS — BP 148/83 | HR 89 | Temp 97.3°F | Ht 70.0 in | Wt 159.6 lb

## 2019-03-16 DIAGNOSIS — K50014 Crohn's disease of small intestine with abscess: Secondary | ICD-10-CM | POA: Diagnosis not present

## 2019-03-16 NOTE — H&P (View-Only) (Signed)
Primary Care Physician: Sofie Hartigan, MD  Primary Gastroenterologist:  Dr. [name]  Chief Complaint  Patient presents with  . Follow up ER    HPI: Todd Briggs is a 74 y.o. male here for follow-up after being discharged from the hospital on January 4 of this year.  The patient was admitted with ileitis that was reported to be acute on chronic with an abscess that was treated with a percutaneous drainage.  The patient has since followed up with surgery who recommended the patient see me.  The patient had a repeat CT scan that showed:  IMPRESSION: 1. Decreased distal small bowel wall thickening and mesenteric inflammatory changes in right lower quadrant. 2. No residual right lower quadrant fluid collections following percutaneous drainage catheter placement 3. Stable 2.1 cm enhancing mass in lower pole of right kidney, consistent with renal cell carcinoma. No evidence of metastatic disease.  The patient reports that he had a colonoscopy 4 to 5 years ago and he believes it was at the hospital by Dr. Vira Agar.  He says that there was some redness around the end of his colon on the right side but there is no record of the procedure being done at the hospital nor is there records on care everywhere through his [facility] chart.  The patient states that he still has some right lower quadrant pain when he eats green vegetables.  The patient's drain has been removed and he feels well at the present time.  He has had irritable bowel syndrome with bouts of diarrhea for many many years.  There is no report of any black stools or bloody stools.  Current Outpatient Medications  Medication Sig Dispense Refill  . acetaminophen (TYLENOL) 325 MG tablet Take 2 tablets (650 mg total) by mouth every 6 (six) hours as needed for mild pain, moderate pain or fever (or Fever >/= 101). 30 tablet 0  . cetirizine (ZYRTEC) 10 MG tablet Take 10 mg by mouth daily.    . ferrous sulfate 325 (65 FE) MG tablet Take 1  tablet (325 mg total) by mouth daily with breakfast. 30 tablet 0  . Sodium Chloride Flush (SALINE FLUSH) 0.9 % SOLN Inject 5 mLs into the vein 2 (two) times daily. 10 mL 0   No current facility-administered medications for this visit.    Allergies as of 03/16/2019 - Review Complete 03/16/2019  Allergen Reaction Noted  . Penicillins Rash 03/01/2015    ROS:  General: Negative for anorexia, weight loss, fever, chills, fatigue, weakness. ENT: Negative for hoarseness, difficulty swallowing , nasal congestion. CV: Negative for chest pain, angina, palpitations, dyspnea on exertion, peripheral edema.  Respiratory: Negative for dyspnea at rest, dyspnea on exertion, cough, sputum, wheezing.  GI: See history of present illness. GU:  Negative for dysuria, hematuria, urinary incontinence, urinary frequency, nocturnal urination.  Endo: Negative for unusual weight change.    Physical Examination:   BP (!) 148/83   Pulse 89   Temp (!) 97.3 F (36.3 C) (Temporal)   Ht 5' 10"  (1.778 m)   Wt 159 lb 9.6 oz (72.4 kg)   BMI 22.90 kg/m   General: Well-nourished, well-developed in no acute distress.  Eyes: No icterus. Conjunctivae pink. Extremities: No lower extremity edema. No clubbing or deformities. Neuro: Alert and oriented x 3.  Grossly intact. Skin: Warm and dry, no jaundice.   Psych: Alert and cooperative, normal mood and affect.  Labs:    Imaging Studies: DG Abdomen 1 View  Result  Date: 02/21/2019 CLINICAL DATA:  Abdominal bloating EXAM: ABDOMEN - 1 VIEW COMPARISON:  None. FINDINGS: Mildly distended loops of small bowel in the left upper quadrant. No significant stool burden. IMPRESSION: Mildly distended small bowel loops in the left upper quadrant. Electronically Signed   By: Macy Mis M.D.   On: 02/21/2019 12:43   MR ABDOMEN W WO CONTRAST  Result Date: 02/16/2019 CLINICAL DATA:  Right renal lesion on CT. EXAM: MRI ABDOMEN WITHOUT AND WITH CONTRAST TECHNIQUE: Multiplanar  multisequence MR imaging of the abdomen was performed both before and after the administration of intravenous contrast. CONTRAST:  64m GADAVIST GADOBUTROL 1 MMOL/ML IV SOLN COMPARISON:  CT abdomen/pelvis dated 02/15/2019 FINDINGS: Lower chest: Small bilateral pleural effusions. Associated lower lobe atelectasis. Hepatobiliary: Liver is within normal limits. No suspicious/enhancing hepatic lesions. No hepatic steatosis. Status post cholecystectomy. No intrahepatic or extrahepatic ductal dilatation. Pancreas:  Within normal limits. Spleen:  Within normal limits. Adrenals/Urinary Tract:  Adrenal glands are within normal limits. 1.9 x 2.1 x 1.7 cm solid enhancing mass in the right lower kidney (series 13/image 34), compatible with solid renal neoplasm such as renal cell carcinoma. Left kidney is within normal limits. No hydronephrosis. Stomach/Bowel: Stomach is within normal limits. On coronal HASTE imaging, the terminal ileum is mildly thick-walled although underdistended (series 2/image 21). Visualized bowel is otherwise grossly unremarkable. Vascular/Lymphatic:  No evidence of abdominal aortic aneurysm. Single right renal artery and vein.  No renal vein invasion. 6 mm short axis right para-aortic node inferior to the right renal vein (series 5/image 28), nonspecific. Other: No abdominal ascites. Mild mesenteric edema in the right mid abdomen (series 5/image 35). Musculoskeletal: No focal osseous lesions. IMPRESSION: 2.1 cm solid enhancing mass in the right lower kidney, compatible with solid renal neoplasm such as renal cell carcinoma. Single right renal artery and vein.  No renal vein invasion. 6 mm short axis right para-aortic node inferior to the right renal vein, nonspecific, technically not pathologically enlarged. Consider attention on follow-up. No findings specific for metastatic disease. 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 Electronically Signed   By: SJulian HyM.D.   On: 02/16/2019 15:16   CT Abdomen Pelvis W Contrast  Result Date: 03/09/2019 CLINICAL DATA:  Right lower quadrant pain. Crohn disease. Follow-up abdominal and pelvic abscess. EXAM: CT ABDOMEN AND PELVIS WITH CONTRAST TECHNIQUE: Multidetector CT imaging of the abdomen and pelvis was performed using the standard protocol following bolus administration of intravenous contrast. CONTRAST:  1060mOMNIPAQUE IOHEXOL 300 MG/ML  SOLN COMPARISON:  CT on 02/21/2019, and MRI on 02/16/2019 FINDINGS: Lower Chest: No acute findings. Hepatobiliary: No hepatic masses identified. A few tiny sub-cm low attention lesions are too small to characterize, but remains stable and most likely represent tiny cysts. Prior cholecystectomy. No evidence of biliary obstruction. Pancreas:  No mass or inflammatory changes. Spleen: Within normal limits in size and appearance. Adrenals/Urinary Tract: Adrenal glands and left kidney are unremarkable. A 2.1 cm enhancing mass is again seen in the lower pole of the right kidney, consistent with renal cell carcinoma. No evidence of hydronephrosis. Stomach/Bowel: Decreased wall thickening and adjacent inflammatory changes seen involving distal small bowel loops in the right lower quadrant. A percutaneous drainage catheter is now seen in place, and no residual fluid collections are seen. Vascular/Lymphatic: No pathologically enlarged lymph nodes. No abdominal aortic aneurysm. Aortic atherosclerosis incidentally noted. Congenital duplication of IVC again noted. Reproductive:  No mass or other significant abnormality.  Other:  None. Musculoskeletal:  No suspicious bone lesions identified. IMPRESSION: 1. Decreased distal small bowel wall thickening and mesenteric inflammatory changes in right lower quadrant. 2. No residual right lower quadrant fluid collections following percutaneous drainage catheter placement 3. Stable 2.1 cm enhancing mass in  lower pole of right kidney, consistent with renal cell carcinoma. No evidence of metastatic disease. Aortic Atherosclerosis (ICD10-I70.0). Electronically Signed   By: Marlaine Hind M.D.   On: 03/09/2019 15:29   CT abd  Result Date: 02/21/2019 CLINICAL DATA:  Abdominal pain and distension EXAM: CT ABDOMEN AND PELVIS WITH CONTRAST TECHNIQUE: Multidetector CT imaging of the abdomen and pelvis was performed using the standard protocol following bolus administration of intravenous contrast. CONTRAST:  164m OMNIPAQUE IOHEXOL 300 MG/ML  SOLN COMPARISON:  02/15/2019 FINDINGS: Lower chest: Small bilateral pleural effusions are noted right greater than left with associated right basilar atelectatic changes. Scattered calcified granulomas are seen. Hepatobiliary: Tiny hypodensity is noted in the dome of the liver posteriorly best seen on image number 9 of series 2 stable from the prior exam. A few scattered hypodensities are seen stable from the prior study. Status post cholecystectomy. No biliary dilatation. Pancreas: Unremarkable. No pancreatic ductal dilatation or surrounding inflammatory changes. Spleen: Normal in size without focal abnormality. Adrenals/Urinary Tract: Adrenal glands are stable with a small right adrenal nodule identified. Normal enhancement of the kidneys is seen with the exception of a right lower pole mildly enhancing lesion which is stable from the prior exam. No obstructive changes are seen. The bladder is partially distended. Stomach/Bowel: Colon shows no obstructive changes. Some surrounding inflammatory changes noted related to the small bowel inflammatory change. A small amount of free fluid is noted within the pelvis which is new from the prior exam. No free air is seen. Diffuse inflammatory changes of the distal ileum are again identified with multiple diverticuli is some which contain dense material likely related to ingested content. The overall appearance is stable from the prior exam  with the exception of a new air-fluid collection identified in the right mid abdomen. The fluid component measures approximately 2.7 x 2.4 cm in greatest dimension. It extends to an area of mottled extraluminal air best seen on image number 60 of series 2. A small adjacent air-fluid collection is noted best seen on image number 57 of series 2. These are consistent with small abscesses likely related to a micro perforation. An additional small air-fluid collection is noted in the right mid abdomen on image number 52 of series 2. Stomach is within normal limits. The proximal small bowel is unremarkable. Vascular/Lymphatic: Duplicated IVC is noted. Aortic calcifications are seen. No significant lymphadenopathy is noted. Reproductive: Prostate is unremarkable. Other: Free fluid is noted within the pelvis consistent with an interval perforation. No significant free air is noted. Previously described abscesses are noted. Musculoskeletal: Degenerative changes of lumbar spine are seen. No acute bony abnormality is noted. IMPRESSION: Persistent changes likely related to inflammatory bowel disease within the distal ileum. There has been interval development of multiple air-fluid collections within the right mid and central abdomen. The largest of these measures approximately 2.7 x 2.4 cm as described above. The air-fluid collections appear to inter communicate enter likely related to rupture of a small ileal diverticulum. Mild free fluid in the pelvis is noted. Bilateral pleural effusions with right basilar atelectasis. The remainder of the exam is stable from the prior study. Electronically Signed   By: MInez CatalinaM.D.   On: 02/21/2019 16:08  CT Abdomen Pelvis W Contrast  Result Date: 02/15/2019 CLINICAL DATA:  Abdominal pain since last evening. History of irritable bowel syndrome. EXAM: CT ABDOMEN AND PELVIS WITH CONTRAST TECHNIQUE: Multidetector CT imaging of the abdomen and pelvis was performed using the standard  protocol following bolus administration of intravenous contrast. CONTRAST:  117m OMNIPAQUE IOHEXOL 300 MG/ML  SOLN COMPARISON:  None. FINDINGS: Lower chest: Bibasilar scarring and subpleural atelectasis. No worrisome pulmonary lesions or pleural effusion. The heart is normal in size. No pericardial effusion. Hepatobiliary: A few tiny low-attenuation lesions are likely benign cysts. The gallbladder is surgically absent. There is mild associated intra and extrahepatic biliary dilatation. Pancreas: No mass, inflammation or ductal dilatation. Spleen: Normal size.  No focal lesions. Adrenals/Urinary Tract: Small right adrenal gland nodule, unchanged since a chest CT from 2018 and consistent with a benign adenoma. The left adrenal gland is unremarkable. There is an indeterminate lesion in the lower pole region of the right kidney measuring approximately 21.5 mm. Findings suspicious for a small solid renal neoplasm. A hyperdense/hemorrhagic cyst is possible but this will need further evaluation with MRI. The left kidney is unremarkable. Stomach/Bowel: The stomach is unremarkable. The duodenum and proximal loops of jejunum appear relatively normal. Mild dilatation of the more distal jejunal loops with scattered air-fluid levels. Severe inflammatory or infectious ileitis is noted. There is marked mucosal and serosal enhancement and submucosal edema. The distal loops of ileum are very irregular and some of them demonstrate straightening and areas of traction. There also multiple ileal diverticuli. Some fibrofatty infiltrated type changes are suggestive of acute on chronic ileitis. The terminal ileum is also abnormal. Recommend GI consultation as patient could have Crohn's disease. This could be causing a mild functional obstruction but I do not see an obvious mechanical obstruction. No evidence of fistula or abscess. The colon is relatively decompressed. No colonic inflammatory process. Vascular/Lymphatic: Scattered aortic  calcifications but no aneurysm or dissection. The branch vessels are patent. The major venous structures are patent. 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 Small scattered mesenteric and retroperitoneal lymph nodes likely associated with the small bowel inflammatory process. There is also mild mesenteric edema and a small amount of mesenteric fluid. Reproductive: The prostate gland is slightly enlarged. The seminal vesicles appear normal. Other: No free pelvic fluid collections, pelvic abscess or adenopathy. No inguinal mass or adenopathy. Musculoskeletal: No significant bony findings. Moderate lower lumbar degenerative disc disease and facet disease. IMPRESSION: 1. Fairly extensive inflammatory or infectious ileitis as detailed above. I suspect this is acute on chronic disease. Crohn's disease is certainly a possibility. This may be causing a mild functional obstruction with dilated bowel but no mechanical obstruction is demonstrated. Patient may need GI consultation. 2. Indeterminate 2 cm lower pole right renal lesion worrisome for solid renal neoplasm. Recommend MRI abdomen without and with contrast for further evaluation. Patient may need urology consultation. 3. Scattered ileal diverticuli but no findings for diverticulitis. 4. Status post cholecystectomy.  No biliary dilatation. Electronically Signed   By: PMarijo SanesM.D.   On: 02/15/2019 08:22   CT IMAGE GUIDED DRAINAGE BY PERCUTANEOUS CATHETER  Result Date: 02/23/2019 INDICATION: Concern for inflammatory bowel disease, now with enteric perforation. Please perform CT-guided aspiration and/or drainage catheter placement for infection source control purposes. EXAM: CT IMAGE GUIDED DRAINAGE BY PERCUTANEOUS CATHETER COMPARISON:  CT abdomen pelvis-02/21/2019; 02/15/2019 MEDICATIONS: The patient is currently admitted to the hospital and receiving intravenous antibiotics. The antibiotics were administered within an appropriate time frame prior to the  initiation of the procedure. ANESTHESIA/SEDATION: Moderate (conscious) sedation was employed during this procedure. A total of Versed 2 mg and Fentanyl 100 mcg was administered intravenously. Moderate Sedation Time: 11 minutes. The patient's level of consciousness and vital signs were monitored continuously by radiology nursing throughout the procedure under my direct supervision. CONTRAST:  None COMPLICATIONS: None immediate. PROCEDURE: Informed written consent was obtained from the patient after a discussion of the risks, benefits and alternatives to treatment. The patient was placed supine on the CT gantry and a pre procedural CT was performed re-demonstrating the known interloop abscess/fluid collection within the right lower abdomen/pelvis with dominant bilobed serpiginous component measuring approximately 6.5 x 2.3 cm (image 26, series 2). The procedure was planned. A timeout was performed prior to the initiation of the procedure. The skin overlying the anterolateral aspect of the right lower abdomen was prepped and draped in the usual sterile fashion. The overlying soft tissues were anesthetized with 1% lidocaine with epinephrine. Appropriate trajectory was planned with the use of a 22 gauge spinal needle. An 18 gauge trocar needle was advanced into the abscess/fluid collection and a short Amplatz super stiff wire was coiled within the collection. Appropriate positioning was confirmed with a limited CT scan. The tract was serially dilated allowing placement of a 10 [place] all-purpose drainage catheter. Appropriate positioning was confirmed with a limited postprocedural CT scan. Approximately 35 ml of purulent fluid was aspirated. The tube was connected to a JP bulb and sutured in place. A dressing was placed. The patient tolerated the procedure well without immediate post procedural complication. IMPRESSION: Successful CT guided placement of a 10 French all purpose drain catheter into the interloop abscess  within the right lower abdomen/pelvis with aspiration of 35 mL of purulent fluid. Samples were sent to the laboratory as requested by the ordering clinical team. Electronically Signed   By: Sandi Mariscal M.D.   On: 02/23/2019 12:30    Assessment and Plan:   Todd Briggs is a 74 y.o. y/o male who comes in today after being discharged from the hospital with a right lower quadrant abscess and presumed chronic inflammation of the terminal ileum with acute exacerbation.  The patient will be set up for a colonoscopy to look for any source of inflammatory bowel disease stricture narrowing or neoplasm as the cause of his abscess in the right lower quadrant from his recent hospital stay.  The patient has been explained the plan and agrees with it.     [name], MD. Marval Regal    Note: This dictation was prepared with Dragon dictation along with smaller phrase technology. Any transcriptional errors that result from this process are unintentional.

## 2019-03-16 NOTE — Telephone Encounter (Signed)
Patient's wife called & states patient was seen  today by Dr Allen Norris.She would like to know who is going to make a diagnosis ? He has not been scheduled for a colonoscopy yet. Please call.

## 2019-03-16 NOTE — Progress Notes (Signed)
Primary Care Physician: Sofie Hartigan, MD  Primary Gastroenterologist:  Dr. [name]  Chief Complaint  Patient presents with  . Follow up ER    HPI: Todd Briggs is a 74 y.o. male here for follow-up after being discharged from the hospital on January 4 of this year.  The patient was admitted with ileitis that was reported to be acute on chronic with an abscess that was treated with a percutaneous drainage.  The patient has since followed up with surgery who recommended the patient see me.  The patient had a repeat CT scan that showed:  IMPRESSION: 1. Decreased distal small bowel wall thickening and mesenteric inflammatory changes in right lower quadrant. 2. No residual right lower quadrant fluid collections following percutaneous drainage catheter placement 3. Stable 2.1 cm enhancing mass in lower pole of right kidney, consistent with renal cell carcinoma. No evidence of metastatic disease.  The patient reports that he had a colonoscopy 4 to 5 years ago and he believes it was at the hospital by Dr. Vira Agar.  He says that there was some redness around the end of his colon on the right side but there is no record of the procedure being done at the hospital nor is there records on care everywhere through his [facility] chart.  The patient states that he still has some right lower quadrant pain when he eats green vegetables.  The patient's drain has been removed and he feels well at the present time.  He has had irritable bowel syndrome with bouts of diarrhea for many many years.  There is no report of any black stools or bloody stools.  Current Outpatient Medications  Medication Sig Dispense Refill  . acetaminophen (TYLENOL) 325 MG tablet Take 2 tablets (650 mg total) by mouth every 6 (six) hours as needed for mild pain, moderate pain or fever (or Fever >/= 101). 30 tablet 0  . cetirizine (ZYRTEC) 10 MG tablet Take 10 mg by mouth daily.    . ferrous sulfate 325 (65 FE) MG tablet Take 1  tablet (325 mg total) by mouth daily with breakfast. 30 tablet 0  . Sodium Chloride Flush (SALINE FLUSH) 0.9 % SOLN Inject 5 mLs into the vein 2 (two) times daily. 10 mL 0   No current facility-administered medications for this visit.    Allergies as of 03/16/2019 - Review Complete 03/16/2019  Allergen Reaction Noted  . Penicillins Rash 03/01/2015    ROS:  General: Negative for anorexia, weight loss, fever, chills, fatigue, weakness. ENT: Negative for hoarseness, difficulty swallowing , nasal congestion. CV: Negative for chest pain, angina, palpitations, dyspnea on exertion, peripheral edema.  Respiratory: Negative for dyspnea at rest, dyspnea on exertion, cough, sputum, wheezing.  GI: See history of present illness. GU:  Negative for dysuria, hematuria, urinary incontinence, urinary frequency, nocturnal urination.  Endo: Negative for unusual weight change.    Physical Examination:   BP (!) 148/83   Pulse 89   Temp (!) 97.3 F (36.3 C) (Temporal)   Ht 5' 10"  (1.778 m)   Wt 159 lb 9.6 oz (72.4 kg)   BMI 22.90 kg/m   General: Well-nourished, well-developed in no acute distress.  Eyes: No icterus. Conjunctivae pink. Extremities: No lower extremity edema. No clubbing or deformities. Neuro: Alert and oriented x 3.  Grossly intact. Skin: Warm and dry, no jaundice.   Psych: Alert and cooperative, normal mood and affect.  Labs:    Imaging Studies: DG Abdomen 1 View  Result  Date: 02/21/2019 CLINICAL DATA:  Abdominal bloating EXAM: ABDOMEN - 1 VIEW COMPARISON:  None. FINDINGS: Mildly distended loops of small bowel in the left upper quadrant. No significant stool burden. IMPRESSION: Mildly distended small bowel loops in the left upper quadrant. Electronically Signed   By: Macy Mis M.D.   On: 02/21/2019 12:43   MR ABDOMEN W WO CONTRAST  Result Date: 02/16/2019 CLINICAL DATA:  Right renal lesion on CT. EXAM: MRI ABDOMEN WITHOUT AND WITH CONTRAST TECHNIQUE: Multiplanar  multisequence MR imaging of the abdomen was performed both before and after the administration of intravenous contrast. CONTRAST:  30m GADAVIST GADOBUTROL 1 MMOL/ML IV SOLN COMPARISON:  CT abdomen/pelvis dated 02/15/2019 FINDINGS: Lower chest: Small bilateral pleural effusions. Associated lower lobe atelectasis. Hepatobiliary: Liver is within normal limits. No suspicious/enhancing hepatic lesions. No hepatic steatosis. Status post cholecystectomy. No intrahepatic or extrahepatic ductal dilatation. Pancreas:  Within normal limits. Spleen:  Within normal limits. Adrenals/Urinary Tract:  Adrenal glands are within normal limits. 1.9 x 2.1 x 1.7 cm solid enhancing mass in the right lower kidney (series 13/image 34), compatible with solid renal neoplasm such as renal cell carcinoma. Left kidney is within normal limits. No hydronephrosis. Stomach/Bowel: Stomach is within normal limits. On coronal HASTE imaging, the terminal ileum is mildly thick-walled although underdistended (series 2/image 21). Visualized bowel is otherwise grossly unremarkable. Vascular/Lymphatic:  No evidence of abdominal aortic aneurysm. Single right renal artery and vein.  No renal vein invasion. 6 mm short axis right para-aortic node inferior to the right renal vein (series 5/image 28), nonspecific. Other: No abdominal ascites. Mild mesenteric edema in the right mid abdomen (series 5/image 35). Musculoskeletal: No focal osseous lesions. IMPRESSION: 2.1 cm solid enhancing mass in the right lower kidney, compatible with solid renal neoplasm such as renal cell carcinoma. Single right renal artery and vein.  No renal vein invasion. 6 mm short axis right para-aortic node inferior to the right renal vein, nonspecific, technically not pathologically enlarged. Consider attention on follow-up. No findings specific for metastatic disease. 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 Electronically Signed   By: SJulian HyM.D.   On: 02/16/2019 15:16   CT Abdomen Pelvis W Contrast  Result Date: 03/09/2019 CLINICAL DATA:  Right lower quadrant pain. Crohn disease. Follow-up abdominal and pelvic abscess. EXAM: CT ABDOMEN AND PELVIS WITH CONTRAST TECHNIQUE: Multidetector CT imaging of the abdomen and pelvis was performed using the standard protocol following bolus administration of intravenous contrast. CONTRAST:  1029mOMNIPAQUE IOHEXOL 300 MG/ML  SOLN COMPARISON:  CT on 02/21/2019, and MRI on 02/16/2019 FINDINGS: Lower Chest: No acute findings. Hepatobiliary: No hepatic masses identified. A few tiny sub-cm low attention lesions are too small to characterize, but remains stable and most likely represent tiny cysts. Prior cholecystectomy. No evidence of biliary obstruction. Pancreas:  No mass or inflammatory changes. Spleen: Within normal limits in size and appearance. Adrenals/Urinary Tract: Adrenal glands and left kidney are unremarkable. A 2.1 cm enhancing mass is again seen in the lower pole of the right kidney, consistent with renal cell carcinoma. No evidence of hydronephrosis. Stomach/Bowel: Decreased wall thickening and adjacent inflammatory changes seen involving distal small bowel loops in the right lower quadrant. A percutaneous drainage catheter is now seen in place, and no residual fluid collections are seen. Vascular/Lymphatic: No pathologically enlarged lymph nodes. No abdominal aortic aneurysm. Aortic atherosclerosis incidentally noted. Congenital duplication of IVC again noted. Reproductive:  No mass or other significant abnormality.  Other:  None. Musculoskeletal:  No suspicious bone lesions identified. IMPRESSION: 1. Decreased distal small bowel wall thickening and mesenteric inflammatory changes in right lower quadrant. 2. No residual right lower quadrant fluid collections following percutaneous drainage catheter placement 3. Stable 2.1 cm enhancing mass in  lower pole of right kidney, consistent with renal cell carcinoma. No evidence of metastatic disease. Aortic Atherosclerosis (ICD10-I70.0). Electronically Signed   By: Marlaine Hind M.D.   On: 03/09/2019 15:29   CT abd  Result Date: 02/21/2019 CLINICAL DATA:  Abdominal pain and distension EXAM: CT ABDOMEN AND PELVIS WITH CONTRAST TECHNIQUE: Multidetector CT imaging of the abdomen and pelvis was performed using the standard protocol following bolus administration of intravenous contrast. CONTRAST:  179m OMNIPAQUE IOHEXOL 300 MG/ML  SOLN COMPARISON:  02/15/2019 FINDINGS: Lower chest: Small bilateral pleural effusions are noted right greater than left with associated right basilar atelectatic changes. Scattered calcified granulomas are seen. Hepatobiliary: Tiny hypodensity is noted in the dome of the liver posteriorly best seen on image number 9 of series 2 stable from the prior exam. A few scattered hypodensities are seen stable from the prior study. Status post cholecystectomy. No biliary dilatation. Pancreas: Unremarkable. No pancreatic ductal dilatation or surrounding inflammatory changes. Spleen: Normal in size without focal abnormality. Adrenals/Urinary Tract: Adrenal glands are stable with a small right adrenal nodule identified. Normal enhancement of the kidneys is seen with the exception of a right lower pole mildly enhancing lesion which is stable from the prior exam. No obstructive changes are seen. The bladder is partially distended. Stomach/Bowel: Colon shows no obstructive changes. Some surrounding inflammatory changes noted related to the small bowel inflammatory change. A small amount of free fluid is noted within the pelvis which is new from the prior exam. No free air is seen. Diffuse inflammatory changes of the distal ileum are again identified with multiple diverticuli is some which contain dense material likely related to ingested content. The overall appearance is stable from the prior exam  with the exception of a new air-fluid collection identified in the right mid abdomen. The fluid component measures approximately 2.7 x 2.4 cm in greatest dimension. It extends to an area of mottled extraluminal air best seen on image number 60 of series 2. A small adjacent air-fluid collection is noted best seen on image number 57 of series 2. These are consistent with small abscesses likely related to a micro perforation. An additional small air-fluid collection is noted in the right mid abdomen on image number 52 of series 2. Stomach is within normal limits. The proximal small bowel is unremarkable. Vascular/Lymphatic: Duplicated IVC is noted. Aortic calcifications are seen. No significant lymphadenopathy is noted. Reproductive: Prostate is unremarkable. Other: Free fluid is noted within the pelvis consistent with an interval perforation. No significant free air is noted. Previously described abscesses are noted. Musculoskeletal: Degenerative changes of lumbar spine are seen. No acute bony abnormality is noted. IMPRESSION: Persistent changes likely related to inflammatory bowel disease within the distal ileum. There has been interval development of multiple air-fluid collections within the right mid and central abdomen. The largest of these measures approximately 2.7 x 2.4 cm as described above. The air-fluid collections appear to inter communicate enter likely related to rupture of a small ileal diverticulum. Mild free fluid in the pelvis is noted. Bilateral pleural effusions with right basilar atelectasis. The remainder of the exam is stable from the prior study. Electronically Signed   By: MInez CatalinaM.D.   On: 02/21/2019 16:08  CT Abdomen Pelvis W Contrast  Result Date: 02/15/2019 CLINICAL DATA:  Abdominal pain since last evening. History of irritable bowel syndrome. EXAM: CT ABDOMEN AND PELVIS WITH CONTRAST TECHNIQUE: Multidetector CT imaging of the abdomen and pelvis was performed using the standard  protocol following bolus administration of intravenous contrast. CONTRAST:  13m OMNIPAQUE IOHEXOL 300 MG/ML  SOLN COMPARISON:  None. FINDINGS: Lower chest: Bibasilar scarring and subpleural atelectasis. No worrisome pulmonary lesions or pleural effusion. The heart is normal in size. No pericardial effusion. Hepatobiliary: A few tiny low-attenuation lesions are likely benign cysts. The gallbladder is surgically absent. There is mild associated intra and extrahepatic biliary dilatation. Pancreas: No mass, inflammation or ductal dilatation. Spleen: Normal size.  No focal lesions. Adrenals/Urinary Tract: Small right adrenal gland nodule, unchanged since a chest CT from 2018 and consistent with a benign adenoma. The left adrenal gland is unremarkable. There is an indeterminate lesion in the lower pole region of the right kidney measuring approximately 21.5 mm. Findings suspicious for a small solid renal neoplasm. A hyperdense/hemorrhagic cyst is possible but this will need further evaluation with MRI. The left kidney is unremarkable. Stomach/Bowel: The stomach is unremarkable. The duodenum and proximal loops of jejunum appear relatively normal. Mild dilatation of the more distal jejunal loops with scattered air-fluid levels. Severe inflammatory or infectious ileitis is noted. There is marked mucosal and serosal enhancement and submucosal edema. The distal loops of ileum are very irregular and some of them demonstrate straightening and areas of traction. There also multiple ileal diverticuli. Some fibrofatty infiltrated type changes are suggestive of acute on chronic ileitis. The terminal ileum is also abnormal. Recommend GI consultation as patient could have Crohn's disease. This could be causing a mild functional obstruction but I do not see an obvious mechanical obstruction. No evidence of fistula or abscess. The colon is relatively decompressed. No colonic inflammatory process. Vascular/Lymphatic: Scattered aortic  calcifications but no aneurysm or dissection. The branch vessels are patent. The major venous structures are patent. 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 Small scattered mesenteric and retroperitoneal lymph nodes likely associated with the small bowel inflammatory process. There is also mild mesenteric edema and a small amount of mesenteric fluid. Reproductive: The prostate gland is slightly enlarged. The seminal vesicles appear normal. Other: No free pelvic fluid collections, pelvic abscess or adenopathy. No inguinal mass or adenopathy. Musculoskeletal: No significant bony findings. Moderate lower lumbar degenerative disc disease and facet disease. IMPRESSION: 1. Fairly extensive inflammatory or infectious ileitis as detailed above. I suspect this is acute on chronic disease. Crohn's disease is certainly a possibility. This may be causing a mild functional obstruction with dilated bowel but no mechanical obstruction is demonstrated. Patient may need GI consultation. 2. Indeterminate 2 cm lower pole right renal lesion worrisome for solid renal neoplasm. Recommend MRI abdomen without and with contrast for further evaluation. Patient may need urology consultation. 3. Scattered ileal diverticuli but no findings for diverticulitis. 4. Status post cholecystectomy.  No biliary dilatation. Electronically Signed   By: PMarijo SanesM.D.   On: 02/15/2019 08:22   CT IMAGE GUIDED DRAINAGE BY PERCUTANEOUS CATHETER  Result Date: 02/23/2019 INDICATION: Concern for inflammatory bowel disease, now with enteric perforation. Please perform CT-guided aspiration and/or drainage catheter placement for infection source control purposes. EXAM: CT IMAGE GUIDED DRAINAGE BY PERCUTANEOUS CATHETER COMPARISON:  CT abdomen pelvis-02/21/2019; 02/15/2019 MEDICATIONS: The patient is currently admitted to the hospital and receiving intravenous antibiotics. The antibiotics were administered within an appropriate time frame prior to the  initiation of the procedure. ANESTHESIA/SEDATION: Moderate (conscious) sedation was employed during this procedure. A total of Versed 2 mg and Fentanyl 100 mcg was administered intravenously. Moderate Sedation Time: 11 minutes. The patient's level of consciousness and vital signs were monitored continuously by radiology nursing throughout the procedure under my direct supervision. CONTRAST:  None COMPLICATIONS: None immediate. PROCEDURE: Informed written consent was obtained from the patient after a discussion of the risks, benefits and alternatives to treatment. The patient was placed supine on the CT gantry and a pre procedural CT was performed re-demonstrating the known interloop abscess/fluid collection within the right lower abdomen/pelvis with dominant bilobed serpiginous component measuring approximately 6.5 x 2.3 cm (image 26, series 2). The procedure was planned. A timeout was performed prior to the initiation of the procedure. The skin overlying the anterolateral aspect of the right lower abdomen was prepped and draped in the usual sterile fashion. The overlying soft tissues were anesthetized with 1% lidocaine with epinephrine. Appropriate trajectory was planned with the use of a 22 gauge spinal needle. An 18 gauge trocar needle was advanced into the abscess/fluid collection and a short Amplatz super stiff wire was coiled within the collection. Appropriate positioning was confirmed with a limited CT scan. The tract was serially dilated allowing placement of a 10 [place] all-purpose drainage catheter. Appropriate positioning was confirmed with a limited postprocedural CT scan. Approximately 35 ml of purulent fluid was aspirated. The tube was connected to a JP bulb and sutured in place. A dressing was placed. The patient tolerated the procedure well without immediate post procedural complication. IMPRESSION: Successful CT guided placement of a 10 [place] all purpose drain catheter into the interloop abscess  within the right lower abdomen/pelvis with aspiration of 35 mL of purulent fluid. Samples were sent to the laboratory as requested by the ordering clinical team. Electronically Signed   By: Sandi Mariscal M.D.   On: 02/23/2019 12:30    Assessment and Plan:   Todd Briggs is a 74 y.o. y/o male who comes in today after being discharged from the hospital with a right lower quadrant abscess and presumed chronic inflammation of the terminal ileum with acute exacerbation.  The patient will be set up for a colonoscopy to look for any source of inflammatory bowel disease stricture narrowing or neoplasm as the cause of his abscess in the right lower quadrant from his recent hospital stay.  The patient has been explained the plan and agrees with it.     [name], MD. Marval Regal    Note: This dictation was prepared with Dragon dictation along with smaller phrase technology. Any transcriptional errors that result from this process are unintentional.

## 2019-03-16 NOTE — Telephone Encounter (Signed)
There is not a DPR on file for me to speak with his wife. He is scheduled for 04/15/19 for his colonoscopy.

## 2019-03-23 ENCOUNTER — Encounter: Payer: Self-pay | Admitting: Urology

## 2019-03-23 ENCOUNTER — Other Ambulatory Visit: Payer: Self-pay

## 2019-03-23 ENCOUNTER — Ambulatory Visit: Payer: Medicare HMO | Admitting: Urology

## 2019-03-23 VITALS — BP 171/77 | HR 86 | Ht 70.0 in | Wt 159.0 lb

## 2019-03-23 DIAGNOSIS — N2889 Other specified disorders of kidney and ureter: Secondary | ICD-10-CM

## 2019-03-23 NOTE — Progress Notes (Signed)
03/23/19 2:20 PM   [name] March 09, 1945 [id number]  Referring provider: Sofie Hartigan, MD [address] [place],  [place] [address]  CC: Renal mass  HPI: I saw Todd Briggs in urology clinic for evaluation of a small renal mass.  He is a 74 year old relatively healthy male with normal renal function who was recently admitted to [facility] in December 2020 with abdominal pain.  He was ultimately diagnosed with an abdominal abscess that required aspiration and drain placement by interventional radiology and antibiotics.  His drain has since been removed.  Incidentally found at this time was a 2.1 cm subtly enhancing lower pole right renal mass worrisome for renal cell carcinoma.  There was no evidence of metastatic disease.  Follow-up MRI on 12/23, and an additional repeat CT on 1/13 showed stability of the lesion with enhancement worrisome for RCC.  He denies any flank pain or gross hematuria.  There is no prior imaging to evaluate duration of this lesion.  PMH: Past Medical History:  Diagnosis Date  . Allergy    Seasonal  . Anemia   . Arthritis   . Basal cell carcinoma    Removed 1980's  . Cataracts, both eyes   . Diabetes mellitus without complication (HCC)    Borderline  . GERD (gastroesophageal reflux disease)   . IBS (irritable bowel syndrome)   . Peptic ulcer 74 years old  . Scarlet fever 74 years old  . Shortness of breath dyspnea    with exertion    Surgical History: Past Surgical History:  Procedure Laterality Date  . CHOLECYSTECTOMY N/A 03/30/2015   Procedure: LAPAROSCOPIC CHOLECYSTECTOMY;  Surgeon: Hubbard Robinson, MD;  Location: [facility] ORS;  Service: General;  Laterality: N/A;  . HERNIA REPAIR Bilateral 74 years old   Inguinal Hernia     Allergies:  Allergies  Allergen Reactions  . Penicillins Rash    Occurred as a child    Family History: Family History  Problem Relation Age of Onset  . Hypertension Mother   . Diabetes Mother   . Pancreatitis  Mother   . Heart disease Mother   . Heart attack Father   . COPD Father   . Heart disease Father     Social History:  reports that he quit smoking about 34 years ago. His smoking use included cigarettes. He smoked 1.00 pack per day. He has never used smokeless tobacco. He reports that he does not drink alcohol or use drugs.  ROS: Please see flowsheet from today's date for complete review of systems.  Physical Exam: BP (!) 171/77 (BP Location: Left Arm, Patient Position: Sitting, Cuff Size: Normal)   Pulse 86   Ht 5\' 10"  (1.778 m)   Wt 159 lb (72.1 kg)   BMI 22.81 kg/m    Constitutional:  Alert and oriented, No acute distress. Cardiovascular: No clubbing, cyanosis, or edema. Respiratory: Normal respiratory effort, no increased work of breathing. GI: Abdomen is soft, nontender, nondistended, no abdominal masses Lymph: No cervical or inguinal lymphadenopathy. Skin: No rashes, bruises or suspicious lesions. Neurologic: Grossly intact, no focal deficits, moving all 4 extremities. Psychiatric: Normal mood and affect.  Laboratory Data: Reviewed, see HPI  Pertinent Imaging: I have personally reviewed the MRI and CT, 2 cm right lower pole enhancing renal mass worrisome for RCC.  Assessment & Plan:   In summary, is a 74 year old male with recent prolonged hospitalization for abdominal abscess that required percutaneous drain in prolonged course of antibiotics.  Incidentally found on  CT was a 2 cm right lower pole enhancing renal mass worrisome for malignancy.  We discussed that there is a 76 to 80% chance that this represents a malignancy, most likely renal cell carcinoma.  We discussed treatment options at length including active surveillance, percutaneous ablation, partial nephrectomy, and radical nephrectomy.  We discussed the risks and benefits at length of each.  He would not be a good candidate for a robotic partial nephrectomy with his age as well as complicated abdominal abscess  likely resulting in significant scar tissue.  He is interested in active surveillance as a treatment option which is very reasonable.  I recommended repeating a CT in 6 months to evaluate for any interval change, and if the lesion is stable continuing surveillance.  If there is significant growth of the lesion, I recommended percutaneous ablation as a treatment strategy.  We discussed the very low, but not 0, risk of developing metastatic disease on active surveillance.  Retrospective data suggests <4% risk of developing metastatic disease with a renal mass of this size over the next 10 to 15 years.   RTC with CT abdomen pelvis in 6 months  A total of 45 minutes were spent face-to-face with the patient, greater than 50% was spent in patient education, counseling, and coordination of care regarding renal mass and treatment options.  Billey Co, [facility] [address], [place], [place] [address] [phone]

## 2019-03-23 NOTE — Patient Instructions (Signed)
Renal Mass  A renal mass is a growth in the kidney. A renal mass may be found while performing an MRI, CT scan, or ultrasound for other problems of the abdomen. Certain types of cancers, infections, or injuries can cause a renal mass. A renal mass that is cancerous (malignant) may grow or spread quickly. Others are harmless (benign). What are common types of renal masses? Renal masses include:  Tumors. These may be cancerous (malignant) or noncancerous (benign). ? The most common type of kidney cancer is renal cell carcinoma. ? The most common benign tumors of the kidney include renal adenomas, oncocytomas, and angiomyolipoma (AML).  Cysts. These are fluid-filled sacs that form on or in the kidney. ? It is not always known what causes a cyst to develop in or on the kidney. ? Most kidney cysts do not cause symptoms and do not need to be treated. What type of testing might I need? Your health care provider may recommend that you have tests to diagnose the cause of your renal mass. The following tests may be done if a renal mass is found:  Physical exam.  Blood tests.  Urine tests.  Imaging tests, such as ultrasound, CT scan, or MRI.  Biopsy. This is a small sample that is removed from the renal mass and tested in a lab. The exact tests and how often they are done will depend on:  The size and appearance of the renal mass.  Risk factors or medical conditions that increase your risk for problems.  Any symptoms associated with the renal mass, or concerns that you have about it. Tests and physical exams may be done once, or they may be done regularly for a period of time. Tests and exams that are done regularly will help monitor whether the mass is growing and beginning to cause problems. What are common treatments for renal masses? Treatment is not always needed for this condition. Your health care provider may recommend careful monitoring (watchful waiting) and regular tests and exams.  Treatment will depend on the cause of the mass. Follow these instructions at home: What you need to do at home will depend on the cause of the mass. Follow the instructions that your health care provider gives to you. In general:  Take over-the-counter and prescription medicines only as told by your health care provider.  If you are prescribed an antibiotic medicine, take it as told by your health care provider. Do not stop taking the antibiotic even if you start to feel better.  Follow any restrictions that are given to you by your health care provider.  Keep all follow-up visits as told by your health care provider. This is important. ? You may need to see your health care provider once or twice a year to have CT scans and ultrasounds done. These tests will show if your renal mass has changed or grown bigger. Contact a health care provider if you:  Have pain in the side or back (flank pain).  Have a fever.  Feel full soon after eating.  Have pain or swelling in the abdomen.  Lose weight. Get help right away if:  Your pain gets worse.  There is blood in your urine.  You cannot urinate.  You have chest pain.  You have trouble breathing. Summary  A renal mass is a growth in the kidney. It may be cancerous (malignant) and grow or spread quickly, or it may be harmless (benign).  Renal masses may be found while performing  an MRI, CT scan, or ultrasound for other problems of the abdomen.  Your health care provider may recommend that you have tests to diagnose the cause of your renal mass. This may include a physical exam, blood tests, urine tests, imaging, or a biopsy.  Treatment is not always needed for this condition. Careful monitoring (watchful waiting) may be recommended. This information is not intended to replace advice given to you by your health care provider. Make sure you discuss any questions you have with your health care provider. Document Revised: 03/19/2017  Document Reviewed: 03/19/2017 Elsevier Patient Education  2020 [employer].

## 2019-04-11 ENCOUNTER — Encounter: Payer: Self-pay | Admitting: Gastroenterology

## 2019-04-11 ENCOUNTER — Other Ambulatory Visit: Payer: Self-pay

## 2019-04-13 ENCOUNTER — Other Ambulatory Visit
Admission: RE | Admit: 2019-04-13 | Discharge: 2019-04-13 | Disposition: A | Discharge status: Home / Self Care | Payer: Medicare HMO | Source: Ambulatory Visit | Attending: Gastroenterology | Admitting: Gastroenterology

## 2019-04-13 ENCOUNTER — Other Ambulatory Visit: Payer: Self-pay

## 2019-04-13 DIAGNOSIS — Z01812 Encounter for preprocedural laboratory examination: Secondary | ICD-10-CM | POA: Insufficient documentation

## 2019-04-13 DIAGNOSIS — Z20822 Contact with and (suspected) exposure to covid-19: Secondary | ICD-10-CM | POA: Diagnosis not present

## 2019-04-13 LAB — SARS CORONAVIRUS 2 (TAT 6-24 HRS): SARS Coronavirus 2: NEGATIVE

## 2019-04-13 NOTE — Discharge Instructions (Signed)
General Anesthesia, Adult, Care After This sheet gives you information about how to care for yourself after your procedure. Your health care provider may also give you more specific instructions. If you have problems or questions, contact your health care provider. What can I expect after the procedure? After the procedure, the following side effects are common:  Pain or discomfort at the IV site.  Nausea.  Vomiting.  Sore throat.  Trouble concentrating.  Feeling cold or chills.  Weak or tired.  Sleepiness and fatigue.  Soreness and body aches. These side effects can affect parts of the body that were not involved in surgery. Follow these instructions at home:  For at least 24 hours after the procedure:  Have a responsible adult stay with you. It is important to have someone help care for you until you are awake and alert.  Rest as needed.  Do not: ? Participate in activities in which you could fall or become injured. ? Drive. ? Use heavy machinery. ? Drink alcohol. ? Take sleeping pills or medicines that cause drowsiness. ? Make important decisions or sign legal documents. ? Take care of children on your own. Eating and drinking  Follow any instructions from your health care provider about eating or drinking restrictions.  When you feel hungry, start by eating small amounts of foods that are soft and easy to digest (bland), such as toast. Gradually return to your regular diet.  Drink enough fluid to keep your urine pale yellow.  If you vomit, rehydrate by drinking water, juice, or clear broth. General instructions  If you have sleep apnea, surgery and certain medicines can increase your risk for breathing problems. Follow instructions from your health care provider about wearing your sleep device: ? Anytime you are sleeping, including during daytime naps. ? While taking prescription pain medicines, sleeping medicines, or medicines that make you drowsy.  Return to  your normal activities as told by your health care provider. Ask your health care provider what activities are safe for you.  Take over-the-counter and prescription medicines only as told by your health care provider.  If you smoke, do not smoke without supervision.  Keep all follow-up visits as told by your health care provider. This is important. Contact a health care provider if:  You have nausea or vomiting that does not get better with medicine.  You cannot eat or drink without vomiting.  You have pain that does not get better with medicine.  You are unable to pass urine.  You develop a skin rash.  You have a fever.  You have redness around your IV site that gets worse. Get help right away if:  You have difficulty breathing.  You have chest pain.  You have blood in your urine or stool, or you vomit blood. Summary  After the procedure, it is common to have a sore throat or nausea. It is also common to feel tired.  Have a responsible adult stay with you for the first 24 hours after general anesthesia. It is important to have someone help care for you until you are awake and alert.  When you feel hungry, start by eating small amounts of foods that are soft and easy to digest (bland), such as toast. Gradually return to your regular diet.  Drink enough fluid to keep your urine pale yellow.  Return to your normal activities as told by your health care provider. Ask your health care provider what activities are safe for you. This information is not   intended to replace advice given to you by your health care provider. Make sure you discuss any questions you have with your health care provider. Document Revised: 02/13/2017 Document Reviewed: 09/26/2016 Elsevier Patient Education  2020 [employer].  

## 2019-04-14 NOTE — Anesthesia Preprocedure Evaluation (Addendum)
Anesthesia Evaluation  Patient identified by MRN, date of birth, ID band Patient awake    Reviewed: Allergy & Precautions, NPO status , Patient's Chart, lab work & pertinent test results  History of Anesthesia Complications Negative for: history of anesthetic complications  Airway Mallampati: III  TM Distance: <3 FB Neck ROM: Full    Dental  (+) Upper Dentures   Pulmonary former smoker,    breath sounds clear to auscultation       Cardiovascular (-) angina(-) DOE  Rhythm:Regular Rate:Normal     Neuro/Psych    GI/Hepatic PUD, GERD  Controlled, IBS   Endo/Other  diabetes (Patient denies)  Renal/GU      Musculoskeletal  (+) Arthritis ,   Abdominal   Peds  Hematology  (+) anemia ,   Anesthesia Other Findings Skin cancer  Reproductive/Obstetrics                            Anesthesia Physical Anesthesia Plan  ASA: II  Anesthesia Plan: General   Post-op Pain Management:    Induction: Intravenous  PONV Risk Score and Plan: 2 and Propofol infusion, TIVA and Treatment may vary due to age or medical condition  Airway Management Planned: Natural Airway and Nasal Cannula  Additional Equipment:   Intra-op Plan:   Post-operative Plan:   Informed Consent: I have reviewed the patients History and Physical, chart, labs and discussed the procedure including the risks, benefits and alternatives for the proposed anesthesia with the patient or authorized representative who has indicated his/her understanding and acceptance.       Plan Discussed with: CRNA and Anesthesiologist  Anesthesia Plan Comments:        Anesthesia Quick Evaluation

## 2019-04-15 ENCOUNTER — Ambulatory Visit: Payer: Medicare HMO | Admitting: Anesthesiology

## 2019-04-15 ENCOUNTER — Encounter
Admission: RE | Disposition: A | Discharge status: Home / Self Care | Payer: Self-pay | Source: Home / Self Care | Attending: Gastroenterology

## 2019-04-15 ENCOUNTER — Other Ambulatory Visit: Payer: Self-pay

## 2019-04-15 ENCOUNTER — Encounter: Payer: Self-pay | Admitting: Gastroenterology

## 2019-04-15 ENCOUNTER — Ambulatory Visit
Admission: RE | Admit: 2019-04-15 | Discharge: 2019-04-15 | Disposition: A | Discharge status: Home / Self Care | Payer: Medicare HMO | Attending: Gastroenterology | Admitting: Gastroenterology

## 2019-04-15 DIAGNOSIS — Z79899 Other long term (current) drug therapy: Secondary | ICD-10-CM | POA: Diagnosis not present

## 2019-04-15 DIAGNOSIS — Z85828 Personal history of other malignant neoplasm of skin: Secondary | ICD-10-CM | POA: Diagnosis not present

## 2019-04-15 DIAGNOSIS — D649 Anemia, unspecified: Secondary | ICD-10-CM | POA: Insufficient documentation

## 2019-04-15 DIAGNOSIS — K529 Noninfective gastroenteritis and colitis, unspecified: Secondary | ICD-10-CM | POA: Diagnosis present

## 2019-04-15 DIAGNOSIS — Z8711 Personal history of peptic ulcer disease: Secondary | ICD-10-CM | POA: Diagnosis not present

## 2019-04-15 DIAGNOSIS — Z87891 Personal history of nicotine dependence: Secondary | ICD-10-CM | POA: Insufficient documentation

## 2019-04-15 HISTORY — DX: Presence of dental prosthetic device (complete) (partial): Z97.2

## 2019-04-15 HISTORY — DX: Presence of external hearing-aid: Z97.4

## 2019-04-15 HISTORY — DX: Unspecified hearing loss, left ear: H91.92

## 2019-04-15 HISTORY — PX: COLONOSCOPY WITH PROPOFOL: SHX5780

## 2019-04-15 SURGERY — COLONOSCOPY WITH PROPOFOL
Anesthesia: General | Site: Rectum

## 2019-04-15 MED ORDER — LIDOCAINE HCL (CARDIAC) PF 100 MG/5ML IV SOSY
PREFILLED_SYRINGE | INTRAVENOUS | Status: DC | PRN
  Administered 2019-04-15: 50 mg via INTRAVENOUS

## 2019-04-15 MED ORDER — ACETAMINOPHEN 10 MG/ML IV SOLN: 1000.0000 mg | Freq: Once | INTRAVENOUS | Status: DC | PRN

## 2019-04-15 MED ORDER — ONDANSETRON HCL 4 MG/2ML IJ SOLN: 4.0000 mg | Freq: Once | INTRAMUSCULAR | Status: DC | PRN

## 2019-04-15 MED ORDER — LACTATED RINGERS IV SOLN
100.0000 mL/h | INTRAVENOUS | Status: DC
  Administered 2019-04-15: 100 mL/h via INTRAVENOUS

## 2019-04-15 MED ORDER — PROPOFOL 10 MG/ML IV BOLUS
INTRAVENOUS | Status: DC | PRN
  Administered 2019-04-15: 50 mg via INTRAVENOUS
  Administered 2019-04-15 (×2): 80 mg via INTRAVENOUS

## 2019-04-15 MED ORDER — STERILE WATER FOR IRRIGATION IR SOLN
Status: DC | PRN
  Administered 2019-04-15: 50 mL

## 2019-04-15 SURGICAL SUPPLY — 9 items
BALLN DILATOR CRE 12-15 240 (BALLOONS) ×3
BALLOON DILATOR CRE 12-15 240 (BALLOONS) ×1 IMPLANT
CANISTER SUCT 1200ML W/VALVE (MISCELLANEOUS) ×3 IMPLANT
FORCEPS BIOP RAD 4 LRG CAP 4 (CUTTING FORCEPS) ×3 IMPLANT
GOWN CVR UNV OPN BCK APRN NK (MISCELLANEOUS) ×2 IMPLANT
GOWN ISOL THUMB LOOP REG UNIV (MISCELLANEOUS) ×4
KIT ENDO PROCEDURE OLY (KITS) ×3 IMPLANT
SYR INFLATION 60ML (SYRINGE) ×3 IMPLANT
WATER STERILE IRR 250ML POUR (IV SOLUTION) ×3 IMPLANT

## 2019-04-15 NOTE — Anesthesia Postprocedure Evaluation (Signed)
Anesthesia Post Note  Patient: Todd Briggs  Procedure(s) Performed: COLONOSCOPY WITH BIOPSY AND DILATION (N/A Rectum)     Patient location during evaluation: PACU Anesthesia Type: General Level of consciousness: awake and alert Pain management: pain level controlled Vital Signs Assessment: post-procedure vital signs reviewed and stable Respiratory status: spontaneous breathing, nonlabored ventilation, respiratory function stable and patient connected to nasal cannula oxygen Cardiovascular status: blood pressure returned to baseline and stable Postop Assessment: no apparent nausea or vomiting Anesthetic complications: no    Misha Antonini A  Tzirel Leonor

## 2019-04-15 NOTE — Op Note (Signed)
[facility] Gastroenterology Patient Name: Todd Briggs Procedure Date: 04/15/2019 10:23 AM MRN: [id number] Account #: [id number] Date of Birth: 1945/09/18 Admit Type: Outpatient Age: 74 Room: [facility] OR ROOM 01 Gender: Male Note Status: Finalized Procedure:             Colonoscopy Indications:           Chronic diarrhea, Abnormal CT of the GI tract Providers:             [name] MD, MD Referring MD:          Sofie Hartigan (Referring MD) Medicines:             Propofol per Anesthesia Complications:         No immediate complications. Procedure:             Pre-Anesthesia Assessment:                        - Prior to the procedure, a History and Physical was                         performed, and patient medications and allergies were                         reviewed. The patient's tolerance of previous                         anesthesia was also reviewed. The risks and benefits                         of the procedure and the sedation options and risks                         were discussed with the patient. All questions were                         answered, and informed consent was obtained. Prior                         Anticoagulants: The patient has taken no previous                         anticoagulant or antiplatelet agents. ASA Grade                         Assessment: II - A patient with mild systemic disease.                         After reviewing the risks and benefits, the patient                         was deemed in satisfactory condition to undergo the                         procedure.                        After obtaining informed consent, the colonoscope was  passed under direct vision. Throughout the procedure,                         the patient's blood pressure, pulse, and oxygen                         saturations were monitored continuously. The was                         introduced through the anus and  advanced to the the                         cecum, identified by appendiceal orifice and ileocecal                         valve. The colonoscopy was performed without                         difficulty. The patient tolerated the procedure well.                         The quality of the bowel preparation was excellent. Findings:      The perianal and digital rectal examinations were normal.      The exam was otherwise without abnormality.      Biopsies were taken with a cold forceps in the entire colon for       histology. Impression:            - The examination was otherwise normal.                        - Biopsies were taken with a cold forceps for                         histology in the entire colon. Recommendation:        - Discharge patient to home.                        - Resume previous diet.                        - Continue present medications.                        - Await pathology results.                        - Return to my office in 3 weeks. Procedure Code(s):     --- Professional ---                        [phone], Colonoscopy, flexible; with biopsy, single or                         multiple Diagnosis Code(s):     --- Professional ---                        R93.3, Abnormal findings on diagnostic imaging of  other parts of digestive tract                        K52.9, Noninfective gastroenteritis and colitis,                         unspecified CPT copyright 2019 American Medical Association. All rights reserved. The codes documented in this report are preliminary and upon coder review may  be revised to meet current compliance requirements. [name] MD, MD 04/15/2019 10:50:17 AM This report has been signed electronically. Number of Addenda: 0 Note Initiated On: 04/15/2019 10:23 AM Scope Withdrawal Time: 0 hours 16 minutes 4 seconds  Total Procedure Duration: 0 hours 19 minutes 31 seconds  Estimated Blood Loss:  Estimated blood loss:  none.      [facility]

## 2019-04-15 NOTE — Transfer of Care (Signed)
Immediate Anesthesia Transfer of Care Note  Patient: Todd Briggs  Procedure(s) Performed: COLONOSCOPY WITH BIOPSY AND DILATION (N/A Rectum)  Patient Location: PACU  Anesthesia Type: General  Level of Consciousness: awake, alert  and patient cooperative  Airway and Oxygen Therapy: Patient Spontanous Breathing and Patient connected to supplemental oxygen  Post-op Assessment: Post-op Vital signs reviewed, Patient's Cardiovascular Status Stable, Respiratory Function Stable, Patent Airway and No signs of Nausea or vomiting  Post-op Vital Signs: Reviewed and stable  Complications: No apparent anesthesia complications

## 2019-04-15 NOTE — Interval H&P Note (Signed)
History and Physical Interval Note:  04/15/2019 9:52 AM  Todd Briggs  has presented today for surgery, with the diagnosis of ileitis with abscess K50.014.  The various methods of treatment have been discussed with the patient and family. After consideration of risks, benefits and other options for treatment, the patient has consented to  Procedure(s) with comments: COLONOSCOPY WITH PROPOFOL (N/A) - Priority 3 as a surgical intervention.  The patient's history has been reviewed, patient examined, no change in status, stable for surgery.  I have reviewed the patient's chart and labs.  Questions were answered to the patient's satisfaction.     [name] [employer]

## 2019-04-15 NOTE — Anesthesia Procedure Notes (Signed)
Procedure Name: General with mask airway Date/Time: 04/15/2019 10:30 AM Performed by: Jeannene Patella, CRNA Pre-anesthesia Checklist: Patient identified, Emergency Drugs available, Suction available, Patient being monitored and Timeout performed Patient Re-evaluated:Patient Re-evaluated prior to induction Oxygen Delivery Method: Nasal cannula

## 2019-04-18 ENCOUNTER — Encounter: Payer: Self-pay | Admitting: *Deleted

## 2019-04-19 LAB — SURGICAL PATHOLOGY

## 2019-04-28 ENCOUNTER — Telehealth: Payer: Self-pay

## 2019-04-28 NOTE — Telephone Encounter (Signed)
-----   Message from [name], MD sent at 04/27/2019  6:01 AM EST ----- Please have the patient come in for a follow up.

## 2019-04-28 NOTE — Telephone Encounter (Signed)
Pt scheduled for a follow up appt with Dr. Allen Norris on 05/09/19.

## 2019-05-09 ENCOUNTER — Other Ambulatory Visit: Payer: Self-pay

## 2019-05-09 ENCOUNTER — Encounter: Payer: Self-pay | Admitting: Gastroenterology

## 2019-05-09 ENCOUNTER — Ambulatory Visit (INDEPENDENT_AMBULATORY_CARE_PROVIDER_SITE_OTHER): Payer: Medicare HMO | Admitting: Gastroenterology

## 2019-05-09 VITALS — BP 134/77 | HR 74 | Temp 98.0°F | Ht 70.0 in | Wt 161.0 lb

## 2019-05-09 DIAGNOSIS — K50014 Crohn's disease of small intestine with abscess: Secondary | ICD-10-CM | POA: Diagnosis not present

## 2019-05-09 NOTE — Progress Notes (Signed)
Primary Care Physician: Sofie Hartigan, MD  Primary Gastroenterologist:  Dr. [name]  Chief Complaint  Patient presents with  . Follow up procedure results    HPI: Todd Briggs is a 74 y.o. male here for follow-up after having a colonoscopy for his diarrhea.  The patient's random colon biopsies were normal but the terminal ileum showed:  DIAGNOSIS:  A. TERMINAL ILEUM; BIOPSY:  - PATCHY ACTIVE ILEITIS.  - SEE COMMENT.   Comment:  The findings are nonspecific. Diagnostic considerations would include self-limited / infectious enteritis / colitis, medication effects, inflammatory bowel disease, etc. Clinical correlation is recommended.  There is no evidence of dysplasia or malignancy.  The patient was in the hospital in January for microperforations that were drained with radiological intervention.  There is no sign of any cause for these abscesses seen on his colonoscopy. The patient denies any abdominal pain at the present time and states that he still has diarrhea but he can take a half of Imodium tablet and will go away.  He reports his diarrhea is usually just loose bowel movements up to 3 times a day and not usually much more severe than that.  Past Medical History:  Diagnosis Date  . Allergy    Seasonal  . Anemia   . Arthritis   . Basal cell carcinoma    Removed 1980's  . Cataracts, both eyes   . Deaf, left   . Diabetes mellitus without complication (HCC)    Borderline  . GERD (gastroesophageal reflux disease)   . IBS (irritable bowel syndrome)   . Peptic ulcer 74 years old  . Scarlet fever 74 years old  . Shortness of breath dyspnea    with exertion  . Wears dentures    full upper  . Wears hearing aid in right ear     Current Outpatient Medications  Medication Sig Dispense Refill  . cetirizine (ZYRTEC) 10 MG tablet Take 10 mg by mouth daily.    . ferrous sulfate 325 (65 FE) MG tablet Take 1 tablet (325 mg total) by mouth daily with breakfast.  30 tablet 0  . Sodium Chloride Flush (SALINE FLUSH) 0.9 % SOLN Inject 5 mLs into the vein 2 (two) times daily. (Patient not taking: Reported on 04/11/2019) 10 mL 0   No current facility-administered medications for this visit.    Allergies as of 05/09/2019 - Review Complete 05/09/2019  Allergen Reaction Noted  . Penicillins Rash 03/01/2015    ROS:  General: Negative for anorexia, weight loss, fever, chills, fatigue, weakness. ENT: Negative for hoarseness, difficulty swallowing , nasal congestion. CV: Negative for chest pain, angina, palpitations, dyspnea on exertion, peripheral edema.  Respiratory: Negative for dyspnea at rest, dyspnea on exertion, cough, sputum, wheezing.  GI: See history of present illness. GU:  Negative for dysuria, hematuria, urinary incontinence, urinary frequency, nocturnal urination.  Endo: Negative for unusual weight change.    Physical Examination:   BP 134/77   Pulse 74   Temp 98 F (36.7 C) (Temporal)   Ht 5' 10"  (1.778 m)   Wt 161 lb (73 kg)   BMI 23.10 kg/m   General: Well-nourished, well-developed in no acute distress.  Eyes: No icterus. Conjunctivae pink. Extremities: No lower extremity edema. No clubbing or deformities. Neuro: Alert and oriented x 3.  Grossly intact. Skin:  no jaundice.   Psych: Alert and cooperative, normal mood and affect.  Labs:    Imaging Studies: No results found.  Assessment and Plan:  Todd Briggs is a 74 y.o. y/o male who comes in with chronic diarrhea.  The patient had multiple small abscesses around the right side of his peritoneal cavity with drainage through interventional radiology back in January.  The patient had some nonspecific inflammation in the terminal ileum.  The patient is not presenting as a typical inflammatory bowel disease patient and is reporting that he is not having any diarrhea if he takes a half of Imodium tablet or if he takes Pepto-Bismol.  Patient has been told to continue with his  Pepto-Bismol or Imodium as needed and has been set up for a small bowel follow-through to look for any small bowel diverticulum or lesions to explain his history of abscesses.  The patient has been explained the plan and agrees with it.     [name], MD. Marval Regal    Note: This dictation was prepared with Dragon dictation along with smaller phrase technology. Any transcriptional errors that result from this process are unintentional.

## 2019-05-13 ENCOUNTER — Other Ambulatory Visit: Payer: Self-pay

## 2019-05-13 ENCOUNTER — Ambulatory Visit
Admission: RE | Admit: 2019-05-13 | Discharge: 2019-05-13 | Disposition: A | Discharge status: Home / Self Care | Payer: Medicare HMO | Source: Ambulatory Visit | Attending: Gastroenterology | Admitting: Gastroenterology

## 2019-05-13 ENCOUNTER — Telehealth: Payer: Self-pay

## 2019-05-13 DIAGNOSIS — K50014 Crohn's disease of small intestine with abscess: Secondary | ICD-10-CM

## 2019-05-13 NOTE — Telephone Encounter (Signed)
-----   Message from [name], MD sent at 05/13/2019 11:48 AM EDT ----- Let the patient know that it does look like he has Crohn's disease and multiple abnormalities on the small bowel follow-through.  The patient should be seen by Dr. Marius Ditch and I have discussed the case with her.

## 2019-05-13 NOTE — Telephone Encounter (Signed)
Pt notified of imaging results.

## 2019-06-29 ENCOUNTER — Other Ambulatory Visit: Payer: Self-pay

## 2019-06-29 ENCOUNTER — Encounter: Payer: Self-pay | Admitting: Gastroenterology

## 2019-06-29 ENCOUNTER — Ambulatory Visit (INDEPENDENT_AMBULATORY_CARE_PROVIDER_SITE_OTHER): Payer: Medicare HMO | Admitting: Gastroenterology

## 2019-06-29 VITALS — BP 154/79 | HR 83 | Temp 99.1°F | Wt 159.0 lb

## 2019-06-29 DIAGNOSIS — K50014 Crohn's disease of small intestine with abscess: Secondary | ICD-10-CM | POA: Diagnosis not present

## 2019-06-29 NOTE — Progress Notes (Signed)
[name], MD [address]  [place]  [place], [place] [address]  Main: [phone]  Fax: [phone]    Gastroenterology Consultation  Referring Provider:     Sofie Hartigan, MD Primary Care Physician:  Sofie Hartigan, MD Primary Gastroenterologist:  Dr. [name] Reason for Consultation:     Evaluate for small bowel Crohn's        HPI:   Todd Briggs is a 74 y.o. male referred by Dr. Ellison Hughs Chrissie Noa, MD  for consultation & management of evaluate for Crohn's.  Patient was originally admitted on 02/15/2019 secondary to right lower abdominal pain associated with nonbloody loose bowel movements.  Patient has history of diabetes, peptic ulcer disease.  Patient underwent CT abdomen which revealed fairly extensive inflammatory ileitis with mucosal and serosal enhancement, submucosal edema, multiple ileal diverticulosis.  Patient was treated with antibiotics during that admission and was discharged home.  He was readmitted on 02/21/2019 due to ongoing symptoms, repeat CT revealed multiple air-fluid collections, intercommunicating, likely related to rupture of the small ileal diverticulum.  Patient underwent CT-guided drainage of the abscess, drain placement, antibiotics.  He subsequently underwent outpatient colonoscopy after healing of the abscess in 03/2019 which revealed terminal ileitis and normal colon.  Biopsies confirmed active patchy ileitis with no evidence of chronicity.  Random colon biopsies were unremarkable.  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  His weight has been stable.  He denies abdominal pain, nausea or vomiting, abdominal bloating.  He reports that he always has loose bowel movements, about 1-2 times daily.  He does have mild microcytic anemia, normal BMP.  No evidence of iron deficiency  Patient does not smoke or drink alcohol  NSAIDs: None  Antiplts/Anticoagulants/Anti  thrombotics: None  GI Procedures: Colonoscopy 03/2019 Terminal ileitis, normal colon  DIAGNOSIS:  A. TERMINAL ILEUM; BIOPSY:  - PATCHY ACTIVE ILEITIS.  - SEE COMMENT.   Comment:  The findings are nonspecific. Diagnostic considerations would include  self-limited / infectious enteritis / colitis, medication effects,  inflammatory bowel disease, etc. Clinical correlation is recommended.  There is no evidence of dysplasia or malignancy.   B. COLON, RANDOM; BIOPSY:  - COLONIC MUCOSA WITH NO SIGNIFICANT PATHOLOGIC ALTERATION.  - NEGATIVE FOR MICROSCOPIC COLITIS, DYSPLASIA, AND MALIGNANCY.   Past Medical History:  Diagnosis Date  . Allergy    Seasonal  . Anemia   . Arthritis   . Basal cell carcinoma    Removed 1980's  . Cataracts, both eyes   . Deaf, left   . Diabetes mellitus without complication (HCC)    Borderline  . GERD (gastroesophageal reflux disease)   . IBS (irritable bowel syndrome)   . Peptic ulcer 74 years old  . Scarlet fever 74 years old  . Shortness of breath dyspnea    with exertion  . Wears dentures    full upper  . Wears hearing aid in right ear     Past Surgical History:  Procedure Laterality Date  . CATARACT EXTRACTION W/ INTRAOCULAR LENS  IMPLANT, BILATERAL    . CHOLECYSTECTOMY N/A 03/30/2015   Procedure: LAPAROSCOPIC CHOLECYSTECTOMY;  Surgeon: Hubbard Robinson, MD;  Location: [facility];  Service: General;  Laterality: N/A;  . COLONOSCOPY WITH PROPOFOL N/A 04/15/2019   Procedure: COLONOSCOPY WITH BIOPSY AND DILATION;  Surgeon: [name], MD;  Location: [facility];  Service: Endoscopy;  Laterality: N/A;  Priority 3  . HERNIA REPAIR Bilateral 74  years old   Inguinal Hernia    Current Outpatient Medications:  .  acetaminophen (TYLENOL) 500 MG tablet, Take by mouth., Disp: , Rfl:  .  cetirizine (ZYRTEC) 10 MG tablet, Take 10 mg by mouth daily., Disp: , Rfl:  .  ferrous sulfate 325 (65 FE) MG tablet, Take 1 tablet (325 mg total) by mouth  daily with breakfast., Disp: 30 tablet, Rfl: 0 .  loperamide (IMODIUM) 2 MG capsule, Take by mouth., Disp: , Rfl:  .  Sodium Chloride Flush (SALINE FLUSH) 0.9 % SOLN, Inject 5 mLs into the vein 2 (two) times daily. (Patient not taking: Reported on 06/29/2019), Disp: 10 mL, Rfl: 0   Family History  Problem Relation Age of Onset  . Hypertension Mother   . Diabetes Mother   . Pancreatitis Mother   . Heart disease Mother   . Heart attack Father   . COPD Father   . Heart disease Father      Social History   Tobacco Use  . Smoking status: Former Smoker    Packs/day: 1.00    Types: Cigarettes    Quit date: 02/28/1985    Years since quitting: 34.3  . Smokeless tobacco: Never Used  Substance Use Topics  . Alcohol use: No  . Drug use: No    Allergies as of 06/29/2019 - Review Complete 06/29/2019  Allergen Reaction Noted  . Penicillins Rash 03/01/2015    Review of Systems:    All systems reviewed and negative except where noted in HPI.   Physical Exam:  BP (!) 154/79 (BP Location: Left Arm, Patient Position: Sitting, Cuff Size: Normal)   Pulse 83   Temp 99.1 F (37.3 C) (Tympanic)   Wt 159 lb (72.1 kg)   BMI 22.81 kg/m  No LMP for male patient.  General:   Alert,  Well-developed, well-nourished, pleasant and cooperative in NAD Head:  Normocephalic and atraumatic. Eyes:  Sclera clear, no icterus.   Conjunctiva pink. Ears:  Normal auditory acuity. Nose:  No deformity, discharge, or lesions. Mouth:  No deformity or lesions,oropharynx pink & moist. Neck:  Supple; no masses or thyromegaly. Lungs:  Respirations even and unlabored.  Clear throughout to auscultation.   No wheezes, crackles, or rhonchi. No acute distress. Heart:  Regular rate and rhythm; no murmurs, clicks, rubs, or gallops. Abdomen:  Normal bowel sounds. Soft, non-tender and non-distended without masses, hepatosplenomegaly or hernias noted.  No guarding or rebound tenderness.   Rectal: Not performed Msk:   Symmetrical without gross deformities. Good, equal movement & strength bilaterally. Pulses:  Normal pulses noted. Extremities:  No clubbing or edema.  No cyanosis. Neurologic:  Alert and oriented x3;  grossly normal neurologically. Skin:  Intact without significant lesions or rashes. No jaundice. Psych:  Alert and cooperative. Normal mood and affect.  Imaging Studies: Reviewed  Assessment and Plan:   Todd Briggs is a 74 y.o. male with history of acute inflammatory ileitis, ileal diverticulosis, probable diverticulitis with perforation, abscess s/p drainage, colonoscopy revealed active terminal ileitis with no evidence of chronicity on histology.  The histology does not confirm Crohn's disease  Recommend MR enterography, discussed with patient that further recommendations will be based on MR enterography.  If he has inflammatory phlegmon, he might benefit from surgical resection with primary anastomosis.  However, patient is reluctant to undergo surgery even if indicated.  I will discuss with him about medical management based on MR enterography findings Check fecal calprotectin levels Check CRP, iron studies, B12 and folate  panel Check QuantiFERON Gold, TPMT levels, hepatitis panel   Follow up in 4 weeks   [name], MD

## 2019-06-29 NOTE — Patient Instructions (Addendum)
Your MR Welford Roche of the pelvis and abdominal is scheduled for 07/13/2019 arrived at 9:30am. Nothing to eat or drink after midnight.

## 2019-07-05 LAB — CALPROTECTIN, FECAL: Calprotectin, Fecal: 91 ug/g (ref 0–120)

## 2019-07-06 LAB — QUANTIFERON-TB GOLD PLUS
QuantiFERON Mitogen Value: >10 IU/mL
QuantiFERON Nil Value: 0 IU/mL
QuantiFERON TB1 Ag Value: 0 IU/mL
QuantiFERON TB2 Ag Value: 0 IU/mL
QuantiFERON-TB Gold Plus: NEGATIVE

## 2019-07-06 LAB — CBC WITH DIFFERENTIAL/PLATELET
Basophils Absolute: 0 10*3/uL (ref 0.0–0.2)
Basos: 1 %
EOS (ABSOLUTE): 0.1 10*3/uL (ref 0.0–0.4)
Eos: 1 %
Hematocrit: 41 % (ref 37.5–51.0)
Hemoglobin: 13.6 g/dL (ref 13.0–17.7)
Immature Grans (Abs): 0 10*3/uL (ref 0.0–0.1)
Immature Granulocytes: 0 %
Lymphocytes Absolute: 1.6 10*3/uL (ref 0.7–3.1)
Lymphs: 19 %
MCH: 28.5 pg (ref 26.6–33.0)
MCHC: 33.2 g/dL (ref 31.5–35.7)
MCV: 86 fL (ref 79–97)
Monocytes Absolute: 0.4 10*3/uL (ref 0.1–0.9)
Monocytes: 5 %
Neutrophils Absolute: 6.1 10*3/uL (ref 1.4–7.0)
Neutrophils: 74 %
Platelets: 268 10*3/uL (ref 150–450)
RBC: 4.78 x10E6/uL (ref 4.14–5.80)
RDW: 12.8 % (ref 11.6–15.4)
WBC: 8.2 10*3/uL (ref 3.4–10.8)

## 2019-07-06 LAB — IRON,TIBC AND FERRITIN PANEL
Ferritin: 47 ng/mL (ref 30–400)
Iron Saturation: 17 % (ref 15–55)
Iron: 49 ug/dL (ref 38–169)
Total Iron Binding Capacity: 281 ug/dL (ref 250–450)
UIBC: 232 ug/dL (ref 111–343)

## 2019-07-06 LAB — THIOPURINE METHYLTRANSFERASE (TPMT), RBC: TPMT Activity:: 19.6 Units/mL RBC

## 2019-07-06 LAB — HEPATITIS B SURFACE ANTIGEN: Hepatitis B Surface Ag: NEGATIVE

## 2019-07-06 LAB — C-REACTIVE PROTEIN: CRP: 5 mg/L (ref 0–10)

## 2019-07-06 LAB — B12 AND FOLATE PANEL
Folate: 6.4 ng/mL (ref >3.0)
Vitamin B-12: 517 pg/mL (ref 232–1245)

## 2019-07-06 LAB — SEDIMENTATION RATE: Sed Rate: 11 mm/hr (ref 0–30)

## 2019-07-06 LAB — HEPATITIS B CORE ANTIBODY, TOTAL: Hep B Core Total Ab: NEGATIVE

## 2019-07-06 LAB — VITAMIN D 25 HYDROXY (VIT D DEFICIENCY, FRACTURES): Vit D, 25-Hydroxy: 33 ng/mL (ref 30.0–100.0)

## 2019-07-06 LAB — HEPATITIS B SURFACE ANTIBODY,QUALITATIVE: Hep B Surface Ab, Qual: NONREACTIVE

## 2019-07-06 LAB — HEPATITIS A ANTIBODY, TOTAL: hep A Total Ab: POSITIVE — AB

## 2019-07-06 LAB — HEPATITIS C ANTIBODY: Hep C Virus Ab: <0.1 s/co ratio (ref 0.0–0.9)

## 2019-07-07 ENCOUNTER — Other Ambulatory Visit: Payer: Self-pay | Admitting: Gastroenterology

## 2019-07-07 NOTE — Progress Notes (Signed)
His MR is scheduled on 07/13/2019

## 2019-07-13 ENCOUNTER — Ambulatory Visit
Admission: RE | Admit: 2019-07-13 | Discharge: 2019-07-13 | Disposition: A | Discharge status: Home / Self Care | Payer: Medicare HMO | Source: Ambulatory Visit | Attending: Gastroenterology | Admitting: Gastroenterology

## 2019-07-13 ENCOUNTER — Other Ambulatory Visit: Payer: Self-pay

## 2019-07-13 DIAGNOSIS — K50014 Crohn's disease of small intestine with abscess: Secondary | ICD-10-CM | POA: Diagnosis not present

## 2019-07-13 MED ORDER — GADOBUTROL 1 MMOL/ML IV SOLN
7.0000 mL | Freq: Once | INTRAVENOUS | Status: AC | PRN
  Administered 2019-07-13: 7 mL via INTRAVENOUS

## 2019-07-20 ENCOUNTER — Ambulatory Visit (INDEPENDENT_AMBULATORY_CARE_PROVIDER_SITE_OTHER): Payer: Medicare HMO | Admitting: Gastroenterology

## 2019-07-20 ENCOUNTER — Telehealth: Payer: Self-pay

## 2019-07-20 ENCOUNTER — Encounter: Payer: Self-pay | Admitting: Gastroenterology

## 2019-07-20 ENCOUNTER — Other Ambulatory Visit: Payer: Self-pay

## 2019-07-20 VITALS — BP 175/83 | HR 75 | Temp 98.3°F | Wt 161.4 lb

## 2019-07-20 DIAGNOSIS — K50014 Crohn's disease of small intestine with abscess: Secondary | ICD-10-CM | POA: Diagnosis not present

## 2019-07-20 DIAGNOSIS — K56699 Other intestinal obstruction unspecified as to partial versus complete obstruction: Secondary | ICD-10-CM | POA: Diagnosis not present

## 2019-07-20 NOTE — Telephone Encounter (Signed)
-----   Message from Lin Landsman, MD sent at 07/19/2019  5:29 PM EDT ----- Please schedule a follow-up visit to discuss MRI results with me Okay to [employer]

## 2019-07-20 NOTE — Progress Notes (Signed)
[name], MD [address]  [place]  [place], [place] [address]  Main: [phone]  Fax: [phone]    Gastroenterology Consultation  Referring Provider:     Sofie Hartigan, MD Primary Care Physician:  Sofie Hartigan, MD Primary Gastroenterologist:  Dr. [name] Reason for Consultation:     Evaluate for small bowel Crohn's        HPI:   Todd Briggs is a 74 y.o. male referred by Dr. Ellison Hughs Chrissie Noa, MD  for consultation & management of evaluate for Crohn's.  Patient was originally admitted on 02/15/2019 secondary to right lower abdominal pain associated with nonbloody loose bowel movements.  Patient has history of diabetes, peptic ulcer disease.  Patient underwent CT abdomen which revealed fairly extensive inflammatory ileitis with mucosal and serosal enhancement, submucosal edema, multiple ileal diverticulosis.  Patient was treated with antibiotics during that admission and was discharged home.  He was readmitted on 02/21/2019 due to ongoing symptoms, repeat CT revealed multiple air-fluid collections, intercommunicating, likely related to rupture of the small ileal diverticulum.  Patient underwent CT-guided drainage of the abscess, drain placement, antibiotics.  He subsequently underwent outpatient colonoscopy after healing of the abscess in 03/2019 which revealed terminal ileitis and normal colon.  Biopsies confirmed active patchy ileitis with no evidence of chronicity.  Random colon biopsies were unremarkable.  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  His weight has been stable.  He denies abdominal pain, nausea or vomiting, abdominal bloating.  He reports that he always has loose bowel movements, about 1-2 times daily.  He does have mild microcytic anemia, normal BMP.  No evidence of iron deficiency  Patient does not smoke or drink alcohol  Follow-up visit 07/20/2019 Patient reports doing  well without any symptoms at this time.  Patient underwent MR enterography on 07/13/2019 which revealed Stomach/Bowel: No evidence of bowel obstruction or fistula. Pseudo sacculation and mural stratification involving the distal ileum with low T2 signal and intermediate T2 signal in the wall. Similar to the prior study. Drain no longer present. Tract from previous drain is seen in the abdominal wall. No tract extending from bowel to the dominant wall. Areas of associated stricture are similar without significant upstream bowel obstruction or current evidence of penetrating disease  Most recent laboratory work revealed normal CRP, fecal calprotectin levels, ESR, vitamin D levels, QuantiFERON gold, TPMT, iron panel, B12 and folate panel as well as normal CBC  NSAIDs: None  Antiplts/Anticoagulants/Anti thrombotics: None  GI Procedures: Colonoscopy 03/2019 Terminal ileitis, normal colon  DIAGNOSIS:  A. TERMINAL ILEUM; BIOPSY:  - PATCHY ACTIVE ILEITIS.  - SEE COMMENT.   Comment:  The findings are nonspecific. Diagnostic considerations would include  self-limited / infectious enteritis / colitis, medication effects,  inflammatory bowel disease, etc. Clinical correlation is recommended.  There is no evidence of dysplasia or malignancy.   B. COLON, RANDOM; BIOPSY:  - COLONIC MUCOSA WITH NO SIGNIFICANT PATHOLOGIC ALTERATION.  - NEGATIVE FOR MICROSCOPIC COLITIS, DYSPLASIA, AND MALIGNANCY.   Past Medical History:  Diagnosis Date  . Allergy    Seasonal  . Anemia   . Arthritis   . Basal cell carcinoma    Removed 1980's  . Cataracts, both eyes   . Deaf, left   . Diabetes mellitus without complication (HCC)    Borderline  . GERD (gastroesophageal reflux disease)   . IBS (irritable bowel syndrome)   . Peptic ulcer 74 years  old  . Scarlet fever 74 years old  . Shortness of breath dyspnea    with exertion  . Wears dentures    full upper  . Wears hearing aid in right ear      Past Surgical History:  Procedure Laterality Date  . CATARACT EXTRACTION W/ INTRAOCULAR LENS  IMPLANT, BILATERAL    . CHOLECYSTECTOMY N/A 03/30/2015   Procedure: LAPAROSCOPIC CHOLECYSTECTOMY;  Surgeon: Hubbard Robinson, MD;  Location: [facility];  Service: General;  Laterality: N/A;  . COLONOSCOPY WITH PROPOFOL N/A 04/15/2019   Procedure: COLONOSCOPY WITH BIOPSY AND DILATION;  Surgeon: [name], MD;  Location: [facility];  Service: Endoscopy;  Laterality: N/A;  Priority 3  . HERNIA REPAIR Bilateral 73 years old   Inguinal Hernia    Current Outpatient Medications:  .  acetaminophen (TYLENOL) 500 MG tablet, Take by mouth., Disp: , Rfl:  .  cetirizine (ZYRTEC) 10 MG tablet, Take 10 mg by mouth daily., Disp: , Rfl:  .  ferrous sulfate 325 (65 FE) MG tablet, Take 1 tablet (325 mg total) by mouth daily with breakfast., Disp: 30 tablet, Rfl: 0 .  loperamide (IMODIUM) 2 MG capsule, Take by mouth., Disp: , Rfl:    Family History  Problem Relation Age of Onset  . Hypertension Mother   . Diabetes Mother   . Pancreatitis Mother   . Heart disease Mother   . Heart attack Father   . COPD Father   . Heart disease Father      Social History   Tobacco Use  . Smoking status: Former Smoker    Packs/day: 1.00    Types: Cigarettes    Quit date: 02/28/1985    Years since quitting: 34.4  . Smokeless tobacco: Never Used  Substance Use Topics  . Alcohol use: No  . Drug use: No    Allergies as of 07/20/2019 - Review Complete 07/20/2019  Allergen Reaction Noted  . Penicillins Rash 03/01/2015    Review of Systems:    All systems reviewed and negative except where noted in HPI.   Physical Exam:  BP (!) 175/83 (BP Location: Left Arm, Patient Position: Sitting, Cuff Size: Normal)   Pulse 75   Temp 98.3 F (36.8 C) (Oral)   Wt 161 lb 6 oz (73.2 kg)   BMI 23.15 kg/m  No LMP for male patient.  General:   Alert,  Well-developed, well-nourished, pleasant and cooperative in  NAD Head:  Normocephalic and atraumatic. Eyes:  Sclera clear, no icterus.   Conjunctiva pink. Ears:  Normal auditory acuity. Nose:  No deformity, discharge, or lesions. Mouth:  No deformity or lesions,oropharynx pink & moist. Neck:  Supple; no masses or thyromegaly. Lungs:  Respirations even and unlabored.  Clear throughout to auscultation.   No wheezes, crackles, or rhonchi. No acute distress. Heart:  Regular rate and rhythm; no murmurs, clicks, rubs, or gallops. Abdomen:  Normal bowel sounds. Soft, non-tender and non-distended without masses, hepatosplenomegaly or hernias noted.  No guarding or rebound tenderness.   Rectal: Not performed Msk:  Symmetrical without gross deformities. Good, equal movement & strength bilaterally. Pulses:  Normal pulses noted. Extremities:  No clubbing or edema.  No cyanosis. Neurologic:  Alert and oriented x3;  grossly normal neurologically. Skin:  Intact without significant lesions or rashes. No jaundice. Psych:  Alert and cooperative. Normal mood and affect.  Imaging Studies: Reviewed  Assessment and Plan:   Todd Briggs is a 74 y.o. male with history of acute inflammatory ileitis,  ileal diverticulosis, probable diverticulitis with perforation, abscess s/p drainage, colonoscopy revealed active terminal ileitis with no evidence of chronicity on histology.  The histology does not confirm Crohn's disease  MR enterography revealed pseudo sacculation, mural stratification involving the distal ileum as well as associated stricture without significant upstream bowel obstruction or current evidence of penetrating disease. Since the histology did not reveal evidence of chronicity, biochemical indicators did not reveal evidence of active inflammation,  I am reluctant to put him on medical therapy including steroids at this time.  I discussed with him about indication for surgical resection with probable primary anastomosis.  I offered him referral to [facility]  colorectal surgery group and he was amenable to it.  Referral placed today.  However, patient is not excited about surgery.  Convinced him that he should at least meet with the colorectal surgeon to discuss options.   Follow up in 3 months   [name], MD

## 2019-07-20 NOTE — Telephone Encounter (Signed)
Patient has appointment today at 1:00pm in [facility]

## 2019-08-25 DIAGNOSIS — K50114 Crohn's disease of large intestine with abscess: Secondary | ICD-10-CM

## 2019-08-25 HISTORY — DX: Crohn's disease of large intestine with abscess: K50.114

## 2019-09-13 HISTORY — PX: COLON SURGERY: SHX602

## 2019-09-20 ENCOUNTER — Ambulatory Visit: Payer: Medicare HMO | Admitting: Urology

## 2019-10-11 ENCOUNTER — Other Ambulatory Visit: Payer: Self-pay

## 2019-10-11 ENCOUNTER — Ambulatory Visit (INDEPENDENT_AMBULATORY_CARE_PROVIDER_SITE_OTHER): Payer: Medicare HMO | Admitting: Urology

## 2019-10-11 ENCOUNTER — Encounter: Payer: Self-pay | Admitting: Urology

## 2019-10-11 VITALS — BP 125/84 | HR 83 | Ht 70.0 in | Wt 141.0 lb

## 2019-10-11 DIAGNOSIS — N2889 Other specified disorders of kidney and ureter: Secondary | ICD-10-CM

## 2019-10-11 NOTE — Progress Notes (Signed)
   10/11/2019 12:04 PM   Todd Briggs 1945/05/22 [id number]  Reason for visit: Follow up right renal mass  HPI: I saw Todd Briggs back in urology clinic today for follow-up of a small right 2 cm renal mass and 1 cm adrenal mass.  Briefly, is a 74 year old male who was originally admitted to [facility] in December 2020 with abdominal pain and found to have diverticulitis complicated by abscess requiring drain placement.  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  He opted for 64-monthfollow-up imaging and active surveillance.  An MRI of the abdomen with and without contrast was performed in May 2021 for follow-up of his inflammatory bowel disease showed no changes in the right lower pole lesion size.  [facility] abdominal pain and CT showed severe inflammatory bowel disease involving 25 cm of the terminal ileum, and possible subtle enlargement of the right inferior pole lesion to 2.6 cm, and unchanged 1.1 cm right adrenal nodule.  He ultimately underwent an ileocolic resection and end ileostomy on 09/13/2019, and he continues to recover from the surgery.  He is hoping to be put back in continuity in 6 months.  He remains quite weak from his hospitalization.  We had another long conversation today about his multiple imaging findings showing a small renal mass that is potentially slightly enlarged in size since December 2020.  We discussed treatment options again including active surveillance, ablation with interventional radiology, partial nephrectomy, or radical nephrectomy.  With his extensive recent abdominal surgeries/drains, he would not be a good candidate for a partial nephrectomy.  I recommended a repeat CT abdomen pelvis with contrast in 6 months for surveillance, and if continues to enlarge would certainly recommend referral to interventional  radiology for likely percutaneous ablation.  However, with his current other multiple medical problems and bowel resection, I would recommend ongoing active surveillance at this time.  He is in agreement with this plan.  We again discussed the very low risk of developing metastatic disease in the interval, with retrospective data showing a less than 4% chance of developing metastatic disease over the next 10 to 15 years with active surveillance.  RTC 6 months with CT prior and chest x-ray  I spent 35 total minutes on the day of the encounter including pre-visit review of the medical record, face-to-face time with the patient, and post visit ordering of labs/imaging/tests.    [facility] [facility] [address] [place] [phone]

## 2019-10-11 NOTE — Patient Instructions (Signed)
Kidney Tumor  Kidney cancer is an abnormal growth of cells in one or both kidneys. The kidneys filter waste from your blood and produce urine. Kidney cancer may spread to other parts of your body. This type of cancer may also be called renal cell carcinoma. What are the causes? The cause of this condition is not always known. In some cases, abnormal changes to genes (genetic mutations) can cause cells to form cancer. What increases the risk? You may be more likely to develop kidney cancer if you:  Are over age 10. The risk increases with age.  Have a family history of kidney cancer.  Are of African-American, Native American, or Native [place] descent.  Smoke.  Are male.  Are obese.  Have high blood pressure (hypertension).  Have advanced kidney disease, especially if you need long-term dialysis.  Have certain conditions that are passed from parent to child (inherited), such as von Hippel-Lindau disease, tuberous sclerosis, or hereditary papillary renal carcinoma.  Have been exposed to certain chemicals. What are the signs or symptoms? In the early stages, kidney cancer does not cause symptoms. As the cancer grows, symptoms may include:  Blood in the urine.  Pain in the upper back or abdomen, just below the rib cage. You may feel pain on one or both sides of the body.  Fatigue.  Unexplained weight loss.  Fever. How is this diagnosed? This condition may be diagnosed based on:  Your symptoms and medical history.  A physical exam.  Blood and urine tests.  X-rays.  Imaging tests, such as CT scans, MRIs, and PET scans.  Having dye injected into your blood through an IV, and then having X-rays taken of: ? Your kidneys and the rest of the organs involved in making and storing urine (intravenous pyelogram). ? Your blood vessels (angiogram).  Removal and testing of a kidney tissue sample (biopsy). Your cancer will be assessed (staged), based on how severe it is and how  much it has spread. How is this treated? Treatment depends on the type and stage of the cancer. Treatment may include one or more of the following:  Surgery. This may include surgery to remove: ? Just the tumor (nephron-sparing surgery). ? The entire kidney (nephrectomy). ? The kidney, some of the surrounding healthy tissue, nearby lymph nodes, and the adrenal gland in certain cases (radical nephrectomy).  Medicines that kill cancer cells (chemotherapy).  High-energy rays that kill cancer cells (radiation therapy).  Targeted therapy. This targets specific parts of cancer cells and the area around them to block the growth and the spread of the cancer. Targeted therapy can help to limit the damage to healthy cells.  Medicines that help your body's disease-fighting system (immune system) fight cancer cells (immunotherapy).  Freezing cancer cells using gas or liquid that is delivered through a needle (cryoablation).  Destroying cancer cells using high-energy radio waves that are delivered through a needle-like probe (radiofrequency ablation).  A procedure to block the artery that supplies blood to the tumor, which kills the cancer cells (embolization). Follow these instructions at home: Eating and drinking  Some of your treatments might affect your appetite and your ability to chew and swallow. If you are having problems eating, or if you do not have an appetite, meet with a diet and nutrition specialist (dietitian).  If you have side effects that affect eating, it may help to: ? Eat smaller meals and snacks often. ? Drink high-nutrition and high-calorie shakes or supplements. ? Eat bland and soft  foods that are easy to eat. ? Not eat foods that are hot, spicy, or hard to swallow. Lifestyle  Do not drink alcohol.  Do not use any products that contain nicotine or tobacco, such as cigarettes and e-cigarettes. If you need help quitting, ask your health care provider. General  instructions   Take over-the-counter and prescription medicines only as told by your health care provider. This includes vitamins, supplements, and herbal products.  Consider joining a support group to help you cope with the stress of having kidney cancer.  Work with your health care provider to manage any side effects of treatment.  Keep all follow-up visits as told by your health care provider. This is important. Where to find more information  American Cancer Society: https://www.cancer.[place] https://www.cancer.gov Contact a health care provider if you:  Notice that you bruise or bleed easily.  Are losing weight without trying.  Have new or increased fatigue or weakness. Get help right away if you have:  Blood in your urine.  A sudden increase in pain.  A fever.  Shortness of breath.  Chest pain.  Yellow skin or whites of your eyes (jaundice). Summary  Kidney cancer is an abnormal growth of cells (tumor) in one or both kidneys. Tumors may spread to other parts of your body.  In the early stages, kidney cancer does not cause symptoms. As the cancer grows, symptoms may include blood in the urine, pain in the upper back or abdomen, unexplained weight loss, fatigue, and fever.  Treatment depends on the type and stage of the cancer. It may include surgery to remove the tumor, procedures and medicines to kill the cancer cells, or medicines to help your body fight cancer cells. This information is not intended to replace advice given to you by your health care provider. Make sure you discuss any questions you have with your health care provider. Document Revised: 03/04/2017 Document Reviewed: 03/01/2017 Elsevier Patient Education  [place].

## 2019-10-20 ENCOUNTER — Other Ambulatory Visit: Payer: Self-pay

## 2019-10-20 ENCOUNTER — Encounter: Payer: Self-pay | Admitting: Gastroenterology

## 2019-10-20 ENCOUNTER — Ambulatory Visit (INDEPENDENT_AMBULATORY_CARE_PROVIDER_SITE_OTHER): Payer: Medicare HMO | Admitting: Gastroenterology

## 2019-10-20 VITALS — BP 127/70 | HR 86 | Temp 97.4°F | Wt 143.5 lb

## 2019-10-20 DIAGNOSIS — K50014 Crohn's disease of small intestine with abscess: Secondary | ICD-10-CM | POA: Diagnosis not present

## 2019-10-20 DIAGNOSIS — K50012 Crohn's disease of small intestine with intestinal obstruction: Secondary | ICD-10-CM

## 2019-10-20 MED ORDER — CHOLESTYRAMINE 4 GM/DOSE PO POWD: 4.0000 g | Freq: Two times a day (BID) | ORAL | 2 refills | Status: DC

## 2019-10-20 NOTE — Progress Notes (Signed)
[name], MD [address]  [place]  [place], [place] [address]  Main: [phone]  Fax: [phone]    Gastroenterology Consultation  Referring Provider:     Sofie Hartigan, MD Primary Care Physician:  Sofie Hartigan, MD Primary Gastroenterologist:  Dr. [name] Reason for Consultation:     Evaluate for small bowel Crohn's        HPI:   Todd Briggs is a 74 y.o. male referred by Dr. Ellison Hughs Chrissie Noa, MD  for consultation & management of evaluate for Crohn's.  Patient was originally admitted on 02/15/2019 secondary to right lower abdominal pain associated with nonbloody loose bowel movements.  Patient has history of diabetes, peptic ulcer disease.  Patient underwent CT abdomen which revealed fairly extensive inflammatory ileitis with mucosal and serosal enhancement, submucosal edema, multiple ileal diverticulosis.  Patient was treated with antibiotics during that admission and was discharged home.  He was readmitted on 02/21/2019 due to ongoing symptoms, repeat CT revealed multiple air-fluid collections, intercommunicating, likely related to rupture of the small ileal diverticulum.  Patient underwent CT-guided drainage of the abscess, drain placement, antibiotics.  He subsequently underwent outpatient colonoscopy after healing of the abscess in 03/2019 which revealed terminal ileitis and normal colon.  Biopsies confirmed active patchy ileitis with no evidence of chronicity.  Random colon biopsies were unremarkable.  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  His weight has been stable.  He denies abdominal pain, nausea or vomiting, abdominal bloating.  He reports that he always has loose bowel movements, about 1-2 times daily.  He does have mild microcytic anemia, normal BMP.  No evidence of iron deficiency  Patient does not smoke or drink alcohol  Follow-up visit 07/20/2019 Patient reports doing  well without any symptoms at this time.  Patient underwent MR enterography on 07/13/2019 which revealed Stomach/Bowel: No evidence of bowel obstruction or fistula. Pseudo sacculation and mural stratification involving the distal ileum with low T2 signal and intermediate T2 signal in the wall. Similar to the prior study. Drain no longer present. Tract from previous drain is seen in the abdominal wall. No tract extending from bowel to the dominant wall. Areas of associated stricture are similar without significant upstream bowel obstruction or current evidence of penetrating disease  Most recent laboratory work revealed normal CRP, fecal calprotectin levels, ESR, vitamin D levels, QuantiFERON gold, TPMT, iron panel, B12 and folate panel as well as normal CBC  Follow-up visit 10/20/2019 Patient was evaluated by Dr. Victorio Palm, colorectal surgeon at [facility].  His case was discussed at multidisciplinary IBD conference.  Initially, decision was made to defer surgery.  However, patient developed severe right lower quadrant discomfort, associated with nausea and vomiting concerning for recurrence of abscess and impending perforation.  Imaging revealed recurrence of terminal ileitis therefore, he underwent laparoscopic ileocecectomy on 09/13/2019 at [facility] by Dr. Victorio Palm was discharged home on 09/20/2019.  Patient had postop follow-up 8/10, drain was removed.  He had issues with ostomy care and leakage which has been resolved at this time with proper closure of the seal.  He reports that he has 2 empty bag about 4-5 times a day, ostomy continence brown liquid stool.  He denies any bleeding from stoma site.  He denies abdominal pain.  He reports very good appetite and has been eating very well.  He says he cannot wait for the ostomy takedown and cannot carry a bag rest of his  life.  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 Diamantina Providence, last seen by him on 10/11/2019.  Plan is to continue active surveillance given his recent abdominal surgery and challenging partial nephrectomy  NSAIDs: None  Antiplts/Anticoagulants/Anti thrombotics: None  GI Procedures: Colonoscopy 03/2019 Terminal ileitis, normal colon  DIAGNOSIS:  A. TERMINAL ILEUM; BIOPSY:  - PATCHY ACTIVE ILEITIS.  - SEE COMMENT.   Comment:  The findings are nonspecific. Diagnostic considerations would include  self-limited / infectious enteritis / colitis, medication effects,  inflammatory bowel disease, etc. Clinical correlation is recommended.  There is no evidence of dysplasia or malignancy.   B. COLON, RANDOM; BIOPSY:  - COLONIC MUCOSA WITH NO SIGNIFICANT PATHOLOGIC ALTERATION.  - NEGATIVE FOR MICROSCOPIC COLITIS, DYSPLASIA, AND MALIGNANCY.   Laparoscopic ileocecectomy 09/13/2019 Final Diagnosis    A: Terminal ileum and cecum, resection -Small intestine with chronic active ileitis, stricture, ulcerations, and diverticula (see comment) -Severe acute serositis with fibrinopurulent exudate -No granulomas identified -Appendix with fibrous obliteration  -Four lymph nodes, negative for malignancy (0/4) -Proximal margin appears histologically viable and is involved by mucosal ulceration -Distal margin appears histologically viable -No dysplasia or malignancy   The purpose of this addendum is to report the results of additional special stains.  An EBV ISH is negative.  A GMS stain is negative. A CMV stain was performed and is negative. There are no changes to the original diagnosis.  Past Medical History:  Diagnosis Date  . Abdominal pain 02/15/2019  . Allergy    Seasonal  . Anemia   . Arthritis   . Basal cell carcinoma    Removed 1980's  . Cataracts, both eyes   . Deaf, left   .  Diabetes mellitus without complication (HCC)    Borderline  . Diarrhea 02/21/2019  . GERD (gastroesophageal reflux disease)   . Hypokalemia 02/21/2019  . IBS (irritable bowel syndrome)   . Peptic ulcer 74 years old  . Scarlet fever 73 years old  . Shortness of breath dyspnea    with exertion  . Wears dentures    full upper  . Wears hearing aid in right ear     Past Surgical History:  Procedure Laterality Date  . CATARACT EXTRACTION W/ INTRAOCULAR LENS  IMPLANT, BILATERAL    . CHOLECYSTECTOMY N/A 03/30/2015   Procedure: LAPAROSCOPIC CHOLECYSTECTOMY;  Surgeon: Hubbard Robinson, MD;  Location: [facility];  Service: General;  Laterality: N/A;  . COLONOSCOPY WITH PROPOFOL N/A 04/15/2019   Procedure: COLONOSCOPY WITH BIOPSY AND DILATION;  Surgeon: [name], MD;  Location: [facility];  Service: Endoscopy;  Laterality: N/A;  Priority 3  . HERNIA REPAIR Bilateral 74 years old   Inguinal Hernia    Current Outpatient Medications:  .  acetaminophen (TYLENOL) 500 MG tablet, Take by mouth., Disp: , Rfl:  .  cetirizine (ZYRTEC) 10 MG tablet, Take 10 mg by mouth daily., Disp: , Rfl:  .  loperamide (IMODIUM) 2 MG capsule, Take by mouth., Disp: , Rfl:  .  cholestyramine (QUESTRAN) 4 GM/DOSE powder, Take 1 packet (4 g total) by mouth 2 (two) times daily with a meal., Disp: 378 g, Rfl: 2 .  ferrous sulfate 325 (65 FE) MG tablet, Take 1 tablet (325 mg total) by mouth daily with  breakfast. (Patient not taking: Reported on 10/20/2019), Disp: 30 tablet, Rfl: 0   Family History  Problem Relation Age of Onset  . Hypertension Mother   . Diabetes Mother   . Pancreatitis Mother   . Heart disease Mother   . Heart attack Father   . COPD Father   . Heart disease Father      Social History   Tobacco Use  . Smoking status: Former Smoker    Packs/day: 1.00    Types: Cigarettes    Quit date: 02/28/1985    Years since quitting: 34.6  . Smokeless tobacco: Never Used  Vaping Use  . Vaping Use:  Never used  Substance Use Topics  . Alcohol use: No  . Drug use: No    Allergies as of 10/20/2019 - Review Complete 10/20/2019  Allergen Reaction Noted  . Penicillins Rash 12/05/2013    Review of Systems:    All systems reviewed and negative except where noted in HPI.   Physical Exam:  BP 127/70 (BP Location: Left Arm, Patient Position: Sitting, Cuff Size: Normal)   Pulse 86   Temp (!) 97.4 F (36.3 C) (Oral)   Wt 143 lb 8 oz (65.1 kg)   BMI 20.59 kg/m  No LMP for male patient.  General:   Alert,  Well-developed, well-nourished, pleasant and cooperative in NAD Head:  Normocephalic and atraumatic. Eyes:  Sclera clear, no icterus.   Conjunctiva pink. Ears:  Normal auditory acuity. Nose:  No deformity, discharge, or lesions. Mouth:  No deformity or lesions,oropharynx pink & moist. Neck:  Supple; no masses or thyromegaly. Lungs:  Respirations even and unlabored.  Clear throughout to auscultation.   No wheezes, crackles, or rhonchi. No acute distress. Heart:  Regular rate and rhythm; no murmurs, clicks, rubs, or gallops. Abdomen:  Normal bowel sounds. Soft, non-tender and non-distended without masses, hepatosplenomegaly or hernias noted.  No guarding or rebound tenderness.  Ileostomy with bag in the right lower quadrant, ostomy site appeared healthy, normal-appearing mucosa, liquid brown stool in the bag Rectal: Not performed Msk:  Symmetrical without gross deformities. Good, equal movement & strength bilaterally. Pulses:  Normal pulses noted. Extremities:  No clubbing or edema.  No cyanosis. Neurologic:  Alert and oriented x3;  grossly normal neurologically. Skin:  Intact without significant lesions or rashes. No jaundice. Psych:  Alert and cooperative. Normal mood and affect.  Imaging Studies: Reviewed  Assessment and Plan:   Todd Briggs is a 73 y.o. male with history of acute inflammatory ileitis, ileal diverticulosis, probable diverticulitis with perforation,  abscess s/p drainage, colonoscopy revealed active terminal ileitis with no evidence of chronicity on histology.   MR enterography revealed pseudo sacculation, mural stratification involving the distal ileum as well as associated stricture without significant upstream bowel obstruction or current evidence of penetrating disease. Patient developed recurrence of terminal ileitis in 7/21, underwent laparoscopic ileocecectomy on 09/13/2019, 35 cm of terminal ileum was removed with 5 cm of cecum, s/p temporary ileostomy.  Pathology confirmed Crohn's disease  Crohn's disease of the terminal ileum, stricturing and penetrating phenotype s/p ileocecectomy and ileostomy on 09/13/2019 Removal of the drain on 10/04/2019 Postoperatively, patient has been recovering well from surgery With regards to postop medical management of Crohn's, I will discuss with Dr. Victorio Palm regarding the timing for initiation of Biologics particularly if patient is going to undergo ileostomy takedown Also, with his renal lesion, I will discuss with his urologist Dr. Nickolas Madrid before initiation of Biologics  In the interim, I will  start him on cholestyramine 4 g 1-2 times daily for high ostomy output  Reactive thrombocytosis: Most likely secondary to inflammatory burden Recheck platelet count today  Follow up in 1 month   [name], MD

## 2019-10-21 DIAGNOSIS — K50012 Crohn's disease of small intestine with intestinal obstruction: Secondary | ICD-10-CM | POA: Insufficient documentation

## 2019-10-22 LAB — CBC
Hematocrit: 41.8 % (ref 37.5–51.0)
Hemoglobin: 13.5 g/dL (ref 13.0–17.7)
MCH: 28.1 pg (ref 26.6–33.0)
MCHC: 32.3 g/dL (ref 31.5–35.7)
MCV: 87 fL (ref 79–97)
Platelets: 282 10*3/uL (ref 150–450)
RBC: 4.8 x10E6/uL (ref 4.14–5.80)
RDW: 14.7 % (ref 11.6–15.4)
WBC: 7.9 10*3/uL (ref 3.4–10.8)

## 2019-11-28 ENCOUNTER — Encounter: Payer: Self-pay | Admitting: Gastroenterology

## 2020-03-07 ENCOUNTER — Encounter: Payer: Self-pay | Admitting: Gastroenterology

## 2020-03-07 ENCOUNTER — Other Ambulatory Visit: Payer: Self-pay

## 2020-03-07 ENCOUNTER — Ambulatory Visit: Payer: Medicare HMO | Admitting: Gastroenterology

## 2020-03-07 ENCOUNTER — Telehealth: Payer: Self-pay

## 2020-03-07 VITALS — BP 173/89 | HR 76 | Temp 97.5°F | Ht 70.0 in | Wt 155.2 lb

## 2020-03-07 DIAGNOSIS — K9089 Other intestinal malabsorption: Secondary | ICD-10-CM

## 2020-03-07 DIAGNOSIS — Z9889 Other specified postprocedural states: Secondary | ICD-10-CM | POA: Diagnosis not present

## 2020-03-07 DIAGNOSIS — K50012 Crohn's disease of small intestine with intestinal obstruction: Secondary | ICD-10-CM

## 2020-03-07 DIAGNOSIS — Z8719 Personal history of other diseases of the digestive system: Secondary | ICD-10-CM

## 2020-03-07 MED ORDER — COLESTIPOL HCL 1 G PO TABS: 2.0000 g | ORAL_TABLET | Freq: Two times a day (BID) | ORAL | 2 refills | Status: DC

## 2020-03-07 MED ORDER — RIFAXIMIN 550 MG PO TABS: 550.0000 mg | ORAL_TABLET | Freq: Three times a day (TID) | ORAL | 0 refills | Status: DC

## 2020-03-07 MED ORDER — NA SULFATE-K SULFATE-MG SULF 17.5-3.13-1.6 GM/177ML PO SOLN: 354.0000 mL | Freq: Once | ORAL | 0 refills | Status: AC

## 2020-03-07 NOTE — Telephone Encounter (Signed)
Medication was approved from 03/07/20 to 02/23/21

## 2020-03-07 NOTE — Progress Notes (Signed)
crohn

## 2020-03-07 NOTE — Progress Notes (Signed)
Todd Darby, MD [address]  [place]  [place], [place] [address]  Main: [phone]  Fax: [phone]    Gastroenterology Consultation  Referring Provider:     Sofie Hartigan, MD Primary Care Physician:  Todd Hartigan, MD Primary Gastroenterologist:  Todd Briggs Reason for Consultation:  small bowel Crohn's, postoperative follow-up        HPI:   Todd Briggs is a 75 y.o. male referred by Todd Briggs Todd Noa, MD  for consultation & management of evaluate for Crohn's.  Patient was originally admitted on 02/15/2019 secondary to right lower abdominal pain associated with nonbloody loose bowel movements.  Patient has history of diabetes, peptic ulcer disease.  Patient underwent CT abdomen which revealed fairly extensive inflammatory ileitis with mucosal and serosal enhancement, submucosal edema, multiple ileal diverticulosis.  Patient was treated with antibiotics during that admission and was discharged home.  He was readmitted on 02/21/2019 due to ongoing symptoms, repeat CT revealed multiple air-fluid collections, intercommunicating, likely related to rupture of the small ileal diverticulum.  Patient underwent CT-guided drainage of the abscess, drain placement, antibiotics.  He subsequently underwent outpatient colonoscopy after healing of the abscess in 03/2019 which revealed terminal ileitis and normal colon.  Biopsies confirmed active patchy ileitis with no evidence of chronicity.  Random colon biopsies were unremarkable.  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  His weight has been stable.  He denies abdominal pain, nausea or vomiting, abdominal bloating.  He reports that he always has loose bowel movements, about 1-2 times daily.  He does have mild microcytic anemia, normal BMP.  No evidence of iron deficiency  Patient does not smoke or drink alcohol  Follow-up visit 07/20/2019 Patient reports  doing well without any symptoms at this time.  Patient underwent MR enterography on 07/13/2019 which revealed Stomach/Bowel: No evidence of bowel obstruction or fistula. Pseudo sacculation and mural stratification involving the distal ileum with low T2 signal and intermediate T2 signal in the wall. Similar to the prior study. Drain no longer present. Tract from previous drain is seen in the abdominal wall. No tract extending from bowel to the dominant wall. Areas of associated stricture are similar without significant upstream bowel obstruction or current evidence of penetrating disease  Most recent laboratory work revealed normal CRP, fecal calprotectin levels, ESR, vitamin D levels, QuantiFERON gold, TPMT, iron panel, B12 and folate panel as well as normal CBC  Follow-up visit 10/20/2019 Patient was evaluated by Todd Briggs, colorectal surgeon at [facility].  His case was discussed at multidisciplinary IBD conference.  Initially, decision was made to defer surgery.  However, patient developed severe right lower quadrant discomfort, associated with nausea and vomiting concerning for recurrence of abscess and impending perforation.  Imaging revealed recurrence of terminal ileitis therefore, he underwent laparoscopic ileocecectomy on 09/13/2019 at [facility] by Todd Briggs was discharged home on 09/20/2019.  Patient had postop follow-up 8/10, drain was removed.  He had issues with ostomy care and leakage which has been resolved at this time with proper closure of the seal.  He reports that he has 2 empty bag about 4-5 times a day, ostomy continence brown liquid stool.  He denies any bleeding from stoma site.  He denies abdominal pain.  He reports very good appetite and has been eating very well.  He says he cannot wait for the ostomy takedown and cannot carry a bag rest of his life.  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  Plan is to continue active surveillance given his recent abdominal surgery and challenging partial nephrectomy  Follow-up visit 03/07/2020 Patient underwent ileostomy takedown on 12/19/2019 and he recovered well from the surgery.  Patient reports that his main concern is severe nonbloody diarrhea 4-5 times daily despite taking Imodium.  He could not afford taking cholestyramine due to high co-pay.  He does report intermittent sharp abdominal pain only.  He denies nausea or vomiting postoperatively, patient has been gaining weight with good appetite.  He is able to work long hours.  His labs from October revealed normocytic anemia.  Patient otherwise does not have any concerns today  NSAIDs: None  Antiplts/Anticoagulants/Anti thrombotics: None  GI Procedures: Colonoscopy 03/2019 Terminal ileitis, normal colon  DIAGNOSIS:  A. TERMINAL ILEUM; BIOPSY:  - PATCHY ACTIVE ILEITIS.  - SEE COMMENT.   Comment:  The findings are nonspecific. Diagnostic considerations would include  self-limited / infectious enteritis / colitis, medication effects,  inflammatory bowel disease, etc. Clinical correlation is recommended.  There is no evidence of dysplasia or malignancy.   B. COLON, RANDOM; BIOPSY:  - COLONIC MUCOSA WITH NO SIGNIFICANT PATHOLOGIC ALTERATION.  - NEGATIVE FOR MICROSCOPIC COLITIS, DYSPLASIA, AND MALIGNANCY.   Laparoscopic ileocecectomy 09/13/2019 Final Diagnosis    A: Terminal ileum and cecum, resection -Small intestine with chronic active ileitis, stricture, ulcerations, and diverticula (see comment) -Severe acute serositis with fibrinopurulent exudate -No granulomas identified -Appendix with fibrous obliteration  -Four lymph nodes, negative for malignancy (0/4) -Proximal  margin appears histologically viable and is involved by mucosal ulceration -Distal margin appears histologically viable -No dysplasia or malignancy   The purpose of this addendum is to report the results of additional special stains.  An EBV ISH is negative.  A GMS stain is negative. A CMV stain was performed and is negative. There are no changes to the original diagnosis.  Past Medical History:  Diagnosis Date  . Abdominal pain 02/15/2019  . Allergy    Seasonal  . Anemia   . Arthritis   . Basal cell carcinoma    Removed 1980's  . Cataracts, both eyes   . Deaf, left   . Diabetes mellitus without complication (HCC)    Borderline  . Diarrhea 02/21/2019  . GERD (gastroesophageal reflux disease)   . Hypokalemia 02/21/2019  . IBS (irritable bowel syndrome)   . Peptic ulcer 75 years old  . Scarlet fever 75 years old  . Shortness of breath dyspnea    with exertion  . Wears dentures    full upper  . Wears hearing aid in right ear     Past Surgical History:  Procedure Laterality Date  . CATARACT EXTRACTION W/ INTRAOCULAR LENS  IMPLANT, BILATERAL    . CHOLECYSTECTOMY N/A 03/30/2015   Procedure: LAPAROSCOPIC CHOLECYSTECTOMY;  Surgeon: Hubbard Robinson, MD;  Location: [facility];  Service: General;  Laterality: N/A;  . COLONOSCOPY WITH PROPOFOL N/A 04/15/2019   Procedure: COLONOSCOPY WITH BIOPSY AND DILATION;  Surgeon: [name], MD;  Location: [facility];  Service: Endoscopy;  Laterality: N/A;  Priority 3  . HERNIA REPAIR Bilateral 75 years old   Inguinal Hernia    Current Outpatient Medications:  .  acetaminophen (TYLENOL) 500 MG tablet, Take by mouth., Disp: , Rfl:  .  cetirizine (ZYRTEC) 10 MG tablet, Take 10 mg by mouth daily., Disp: , Rfl:  .  colestipol (COLESTID) 1 g tablet, Take 2 tablets (2 g total) by mouth 2 (two) times daily., Disp: 120 tablet, Rfl: 2 .  loperamide (IMODIUM) 2 MG capsule, Take by mouth., Disp: , Rfl:  .  rifaximin (XIFAXAN) 550 MG TABS  tablet, Take 1 tablet (550 mg total) by mouth 3 (three) times daily for 14 days., Disp: 42 tablet, Rfl: 0 .  Na Sulfate-K Sulfate-Mg Sulf 17.5-3.13-1.6 GM/177ML SOLN, Take 354 mLs by mouth once for 1 dose., Disp: 354 mL, Rfl: 0   Family History  Problem Relation Age of Onset  . Hypertension Mother   . Diabetes Mother   . Pancreatitis Mother   . Heart disease Mother   . Heart attack Father   . COPD Father   . Heart disease Father      Social History   Tobacco Use  . Smoking status: Former Smoker    Packs/day: 1.00    Types: Cigarettes    Quit date: 02/28/1985    Years since quitting: 35.0  . Smokeless tobacco: Never Used  Vaping Use  . Vaping Use: Never used  Substance Use Topics  . Alcohol use: No  . Drug use: No    Allergies as of 03/07/2020 - Review Complete 03/07/2020  Allergen Reaction Noted  . Penicillins Rash 12/05/2013    Review of Systems:    All systems reviewed and negative except where noted in HPI.   Physical Exam:  BP (!) 173/89 (BP Location: Left Arm, Patient Position: Sitting, Cuff Size: Normal)   Pulse 76   Temp (!) 97.5 F (36.4 C) (Oral)   Ht 5' 10" (1.778 m)   Wt 155 lb 4 oz (70.4 kg)   BMI 22.28 kg/m  No LMP for male patient.  General:   Alert,  Well-developed, well-nourished, pleasant and cooperative in NAD Head:  Normocephalic and atraumatic. Eyes:  Sclera clear, no icterus.   Conjunctiva pink. Ears:  Normal auditory acuity. Nose:  No deformity, discharge, or lesions. Mouth:  No deformity or lesions,oropharynx pink & moist. Neck:  Supple; no masses or thyromegaly. Lungs:  Respirations even and unlabored.  Clear throughout to auscultation.   No wheezes, crackles, or rhonchi. No acute distress. Heart:  Regular rate and rhythm; no murmurs, clicks, rubs, or gallops. Abdomen:  Normal bowel sounds. Soft, non-tender and non-distended without masses, hepatosplenomegaly or hernias noted.  No guarding or rebound tenderness.  Well-healed scar from  recent surgery Rectal: Not performed Msk:  Symmetrical without gross deformities. Good, equal movement & strength bilaterally. Pulses:  Normal pulses noted. Extremities:  No clubbing or edema.  No cyanosis. Neurologic:  Alert and oriented x3;  grossly normal neurologically. Skin:  Intact without significant lesions or rashes. No jaundice. Psych:  Alert and cooperative. Normal mood and affect.  Imaging Studies: Reviewed  Assessment and Plan:   ASHKAN CHAMBERLAND is a 75 y.o. male with history of acute inflammatory ileitis, ileal diverticulosis, probable diverticulitis with perforation, abscess s/p drainage, colonoscopy revealed active terminal ileitis with no evidence of chronicity on histology.   MR enterography revealed pseudo sacculation, mural stratification involving the distal ileum as well as associated stricture without significant upstream bowel obstruction or current evidence of penetrating disease. Patient developed recurrence of terminal ileitis in 7/21, underwent laparoscopic ileocecectomy on 09/13/2019, 35 cm of terminal ileum was  removed with 5 cm of cecum, s/p temporary ileostomy followed by closure in 11/2019.  Pathology confirmed Crohn's disease  Crohn's disease of the terminal ileum, stricturing and penetrating phenotype s/p ileocecectomy and ileostomy on 09/13/2019, s/p closure in 11/2019 Recommend colonoscopy to assess postop recurrence of Crohn's disease Recommend trial of colestipol as well as rifaximin for diarrhea to treat empirically for bile salt induced diarrhea and small intestinal bacterial overgrowth given recent small bowel surgery Check CBC, CMP, iron panel and B12 folate panel I have discussed regarding postoperative long-term management to prevent recurrence of Crohn's disease, risks and benefits of biologics such as anti-TNF medications and vedolizumab.  Patient prefers injectable medication such as Humira.  We applied to his insurance.  Patient is immune to  hepatitis A, hepatitis B negative, HCV negative, Quant Farren gold negative   Follow up in 3 months   Todd Darby, MD

## 2020-03-07 NOTE — Telephone Encounter (Signed)
Did PA on Xifaxan through cover my meds. Waiting on insurance response

## 2020-03-08 ENCOUNTER — Telehealth: Payer: Self-pay

## 2020-03-08 LAB — COMPREHENSIVE METABOLIC PANEL
ALT: 6 IU/L (ref 0–44)
AST: 11 IU/L (ref 0–40)
Albumin/Globulin Ratio: 2.1 (ref 1.2–2.2)
Albumin: 4.1 g/dL (ref 3.7–4.7)
Alkaline Phosphatase: 62 IU/L (ref 44–121)
BUN/Creatinine Ratio: 17 (ref 10–24)
BUN: 18 mg/dL (ref 8–27)
Bilirubin Total: 0.3 mg/dL (ref 0.0–1.2)
CO2: 24 mmol/L (ref 20–29)
Calcium: 8.7 mg/dL (ref 8.6–10.2)
Chloride: 105 mmol/L (ref 96–106)
Creatinine, Ser: 1.07 mg/dL (ref 0.76–1.27)
GFR calc Af Amer: 78 mL/min/{1.73_m2} (ref >59)
GFR calc non Af Amer: 67 mL/min/{1.73_m2} (ref >59)
Globulin, Total: 2 g/dL (ref 1.5–4.5)
Glucose: 94 mg/dL (ref 65–99)
Potassium: 3.6 mmol/L (ref 3.5–5.2)
Sodium: 142 mmol/L (ref 134–144)
Total Protein: 6.1 g/dL (ref 6.0–8.5)

## 2020-03-08 LAB — IRON,TIBC AND FERRITIN PANEL
Ferritin: 27 ng/mL — ABNORMAL LOW (ref 30–400)
Iron Saturation: 11 % — ABNORMAL LOW (ref 15–55)
Iron: 33 ug/dL — ABNORMAL LOW (ref 38–169)
Total Iron Binding Capacity: 298 ug/dL (ref 250–450)
UIBC: 265 ug/dL (ref 111–343)

## 2020-03-08 LAB — B12 AND FOLATE PANEL
Folate: 9.1 ng/mL (ref >3.0)
Vitamin B-12: 495 pg/mL (ref 232–1245)

## 2020-03-08 LAB — CBC
Hematocrit: 35.7 % — ABNORMAL LOW (ref 37.5–51.0)
Hemoglobin: 11.8 g/dL — ABNORMAL LOW (ref 13.0–17.7)
MCH: 27.9 pg (ref 26.6–33.0)
MCHC: 33.1 g/dL (ref 31.5–35.7)
MCV: 84 fL (ref 79–97)
Platelets: 255 10*3/uL (ref 150–450)
RBC: 4.23 x10E6/uL (ref 4.14–5.80)
RDW: 12.7 % (ref 11.6–15.4)
WBC: 6.1 10*3/uL (ref 3.4–10.8)

## 2020-03-08 MED ORDER — FUSION PLUS PO CAPS: 1.0000 | ORAL_CAPSULE | Freq: Once | ORAL | 2 refills | Status: AC

## 2020-03-08 NOTE — Telephone Encounter (Signed)
Patient verbalized understanding of instructions. He will pick up medication at pharmacy. Sent medication to pharmacy

## 2020-03-08 NOTE — Telephone Encounter (Signed)
-----   Message from Lin Landsman, MD sent at 03/08/2020  1:39 PM EST ----- Labs are looking better but he does have severe iron deficiency.  Therefore, recommend to start fusion plus one pill daily  Thanks RV

## 2020-03-09 ENCOUNTER — Encounter: Payer: Self-pay | Admitting: Gastroenterology

## 2020-03-12 ENCOUNTER — Other Ambulatory Visit: Payer: Self-pay | Admitting: Gastroenterology

## 2020-03-12 DIAGNOSIS — K50012 Crohn's disease of small intestine with intestinal obstruction: Secondary | ICD-10-CM

## 2020-03-12 MED ORDER — METRONIDAZOLE 500 MG PO TABS: 500.0000 mg | ORAL_TABLET | Freq: Two times a day (BID) | ORAL | 0 refills | Status: AC

## 2020-03-12 NOTE — Telephone Encounter (Signed)
We are waiting on approval from Insurance about the Humira

## 2020-03-14 ENCOUNTER — Telehealth: Payer: Self-pay

## 2020-03-14 NOTE — Telephone Encounter (Signed)
Called and left a message for call back and sent mychart message

## 2020-03-14 NOTE — Telephone Encounter (Signed)
Per Liliane Channel with [facility] I see this patient is approved but with high copay.  We have been reaching out to him.  If you may be able to reach him, he can call us and express his need for copay assistance and we will work to get him help.  Thank you so much!  He can call: [phone]  Called patient and left a message for call back

## 2020-03-15 NOTE — Telephone Encounter (Signed)
Patient read my chart message

## 2020-03-16 ENCOUNTER — Other Ambulatory Visit
Admission: RE | Admit: 2020-03-16 | Discharge: 2020-03-16 | Disposition: A | Discharge status: Home / Self Care | Payer: Medicare HMO | Source: Ambulatory Visit | Attending: Gastroenterology | Admitting: Gastroenterology

## 2020-03-16 ENCOUNTER — Other Ambulatory Visit: Payer: Self-pay

## 2020-03-16 DIAGNOSIS — Z20822 Contact with and (suspected) exposure to covid-19: Secondary | ICD-10-CM | POA: Diagnosis not present

## 2020-03-16 DIAGNOSIS — Z01812 Encounter for preprocedural laboratory examination: Secondary | ICD-10-CM | POA: Insufficient documentation

## 2020-03-16 LAB — SARS CORONAVIRUS 2 (TAT 6-24 HRS): SARS Coronavirus 2: NEGATIVE

## 2020-03-20 ENCOUNTER — Encounter: Payer: Self-pay | Admitting: Gastroenterology

## 2020-03-20 ENCOUNTER — Ambulatory Visit
Admission: RE | Admit: 2020-03-20 | Discharge: 2020-03-20 | Disposition: A | Discharge status: Home / Self Care | Payer: Medicare HMO | Attending: Gastroenterology | Admitting: Gastroenterology

## 2020-03-20 ENCOUNTER — Ambulatory Visit: Payer: Medicare HMO | Admitting: Anesthesiology

## 2020-03-20 ENCOUNTER — Encounter
Admission: RE | Disposition: A | Discharge status: Home / Self Care | Payer: Self-pay | Source: Home / Self Care | Attending: Gastroenterology

## 2020-03-20 DIAGNOSIS — Z880 Allergy status to penicillin: Secondary | ICD-10-CM | POA: Diagnosis not present

## 2020-03-20 DIAGNOSIS — Z8249 Family history of ischemic heart disease and other diseases of the circulatory system: Secondary | ICD-10-CM | POA: Diagnosis not present

## 2020-03-20 DIAGNOSIS — Z8719 Personal history of other diseases of the digestive system: Secondary | ICD-10-CM | POA: Diagnosis not present

## 2020-03-20 DIAGNOSIS — Z8711 Personal history of peptic ulcer disease: Secondary | ICD-10-CM | POA: Insufficient documentation

## 2020-03-20 DIAGNOSIS — Z825 Family history of asthma and other chronic lower respiratory diseases: Secondary | ICD-10-CM | POA: Diagnosis not present

## 2020-03-20 DIAGNOSIS — E119 Type 2 diabetes mellitus without complications: Secondary | ICD-10-CM | POA: Diagnosis not present

## 2020-03-20 DIAGNOSIS — Z87891 Personal history of nicotine dependence: Secondary | ICD-10-CM | POA: Diagnosis not present

## 2020-03-20 DIAGNOSIS — Z79899 Other long term (current) drug therapy: Secondary | ICD-10-CM | POA: Insufficient documentation

## 2020-03-20 DIAGNOSIS — K219 Gastro-esophageal reflux disease without esophagitis: Secondary | ICD-10-CM | POA: Diagnosis not present

## 2020-03-20 DIAGNOSIS — K5000 Crohn's disease of small intestine without complications: Secondary | ICD-10-CM | POA: Insufficient documentation

## 2020-03-20 DIAGNOSIS — Z980 Intestinal bypass and anastomosis status: Secondary | ICD-10-CM | POA: Diagnosis not present

## 2020-03-20 DIAGNOSIS — Z8379 Family history of other diseases of the digestive system: Secondary | ICD-10-CM | POA: Insufficient documentation

## 2020-03-20 DIAGNOSIS — K589 Irritable bowel syndrome without diarrhea: Secondary | ICD-10-CM | POA: Diagnosis not present

## 2020-03-20 DIAGNOSIS — Z833 Family history of diabetes mellitus: Secondary | ICD-10-CM | POA: Diagnosis not present

## 2020-03-20 HISTORY — PX: COLONOSCOPY WITH PROPOFOL: SHX5780

## 2020-03-20 SURGERY — COLONOSCOPY WITH PROPOFOL
Anesthesia: General

## 2020-03-20 MED ORDER — PROPOFOL 500 MG/50ML IV EMUL
INTRAVENOUS | Status: AC
  Filled 2020-03-20: qty 50

## 2020-03-20 MED ORDER — PROPOFOL 500 MG/50ML IV EMUL
INTRAVENOUS | Status: DC | PRN
  Administered 2020-03-20: 180 ug/kg/min via INTRAVENOUS
  Administered 2020-03-20: 150 ug/kg/min via INTRAVENOUS

## 2020-03-20 MED ORDER — LIDOCAINE HCL (CARDIAC) PF 100 MG/5ML IV SOSY
PREFILLED_SYRINGE | INTRAVENOUS | Status: DC | PRN
  Administered 2020-03-20: 90 mg via INTRATRACHEAL

## 2020-03-20 MED ORDER — PROPOFOL 10 MG/ML IV BOLUS
INTRAVENOUS | Status: DC | PRN
  Administered 2020-03-20: 40 mg via INTRAVENOUS

## 2020-03-20 MED ORDER — SODIUM CHLORIDE 0.9 % IV SOLN: INTRAVENOUS | Status: DC

## 2020-03-20 NOTE — Transfer of Care (Signed)
Immediate Anesthesia Transfer of Care Note  Patient: Todd Briggs  Procedure(s) Performed: COLONOSCOPY WITH PROPOFOL (N/A )  Patient Location: PACU  Anesthesia Type:General  Level of Consciousness: sedated  Airway & Oxygen Therapy: Patient Spontanous Breathing and Patient connected to nasal cannula oxygen  Post-op Assessment: Report given to RN and Post -op Vital signs reviewed and stable  Post vital signs: Reviewed and stable  Last Vitals:  Vitals Value Taken Time  BP 111/66 03/20/20 1237  Temp 35.8 C 03/20/20 1237  Pulse 69 03/20/20 1238  Resp 12 03/20/20 1238  SpO2 95 % 03/20/20 1238  Vitals shown include unvalidated device data.  Last Pain:  Vitals:   03/20/20 1237  TempSrc: Temporal  PainSc:          Complications: No complications documented.

## 2020-03-20 NOTE — Op Note (Signed)
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 Estimated blood loss: None. Procedure:             Pre-Anesthesia Assessment:                        - Prior to the procedure, a History and Physical was                         performed, and patient medications and allergies were                         reviewed. The patient is competent. The risks and                         benefits of the procedure and the sedation options and                         risks were discussed with the patient. All questions                         were answered and informed consent was obtained.                         Patient identification and proposed procedure were                         verified by the physician, the nurse, the                         anesthesiologist, the anesthetist and the technician                         in the pre-procedure area in the procedure room in the                         endoscopy suite. Mental Status Examination: alert and                         oriented. Airway Examination: normal oropharyngeal                         airway and neck mobility. Respiratory Examination:  clear to auscultation. CV Examination: normal.                         Prophylactic Antibiotics: The patient does not require                         prophylactic antibiotics. Prior Anticoagulants: The                          patient has taken no previous anticoagulant or                         antiplatelet agents. ASA Grade Assessment: II - A                         patient with mild systemic disease. After reviewing                         the risks and benefits, the patient was deemed in                         satisfactory condition to undergo the procedure. The                         anesthesia plan was to use general anesthesia.                         Immediately prior to administration of medications,                         the patient was re-assessed for adequacy to receive                         sedatives. The heart rate, respiratory rate, oxygen                         saturations, blood pressure, adequacy of pulmonary                         ventilation, and response to care were monitored                         throughout the procedure. The physical status of the                         patient was re-assessed after the procedure.                        After obtaining informed consent, the colonoscope was                         passed under direct vision. Throughout the procedure,                         the patient's blood pressure, pulse, and oxygen                         saturations were monitored continuously. The  Colonoscope was introduced through the anus and                         advanced to the the ileocolonic anastomosis. The                         colonoscopy was performed without difficulty. The                         patient tolerated the procedure well. The quality of                         the bowel preparation was evaluated using the BBPS                         [facility] Bowel Preparation Scale) with scores of: Right                         Colon = 3, Transverse Colon = 3 and Left Colon = 3                         (entire mucosa seen well with no residual staining,                         small fragments of stool or opaque  liquid). The total                         BBPS score equals 9. Findings:      The perianal and digital rectal examinations were normal. Pertinent       negatives include normal sphincter tone and no palpable rectal lesions.      There was evidence of a prior end-to-side ileo-colonic anastomosis in       the ascending colon. This was patent and was characterized by healthy       appearing mucosa and an intact staple line. The anastomosis could not be       traversed as it was not found due to surgical anatomy.      The colon (entire examined portion) appeared normal.      The retroflexed view of the distal rectum and anal verge was normal and       showed no anal or rectal abnormalities. Impression:            - Patent end-to-side ileo-colonic anastomosis,                         characterized by healthy appearing mucosa and an                         intact staple line.                        - The entire examined colon is normal.                        - The distal rectum and anal verge are normal on                         retroflexion view.                        -  No specimens collected. Recommendation:        - Discharge patient to home (with escort).                        - Resume previous diet today.                        - Continue present medications.                        - Return to my office as previously scheduled. Procedure Code(s):     --- Professional ---                        [phone], Colonoscopy, flexible; diagnostic, including                         collection of specimen(s) by brushing or washing, when                         performed (separate procedure) Diagnosis Code(s):     --- Professional ---                        Z98.0, Intestinal bypass and anastomosis status                        K50.00, Crohn's disease of small intestine without                         complications CPT copyright 2019 American Medical Association. All rights reserved. The codes  documented in this report are preliminary and upon coder review may  be revised to meet current compliance requirements. Dr. Ulyess Mort Lin Landsman MD, MD 03/20/2020 12:33:49 PM This report has been signed electronically. Number of Addenda: 0 Note Initiated On: 03/20/2020 11:48 AM Scope Withdrawal Time: 0 hours 10 minutes 42 seconds  Total Procedure Duration: 0 hours 14 minutes 39 seconds  Estimated Blood Loss:  Estimated blood loss: none.      [facility]

## 2020-03-20 NOTE — Anesthesia Postprocedure Evaluation (Signed)
Anesthesia Post Note  Patient: Todd Briggs  Procedure(s) Performed: COLONOSCOPY WITH PROPOFOL (N/A )  Patient location during evaluation: Endoscopy Anesthesia Type: General Level of consciousness: awake and alert Pain management: pain level controlled Vital Signs Assessment: post-procedure vital signs reviewed and stable Respiratory status: spontaneous breathing, nonlabored ventilation and respiratory function stable Cardiovascular status: blood pressure returned to baseline and stable Postop Assessment: no apparent nausea or vomiting Anesthetic complications: no   No complications documented.   Last Vitals:  Vitals:   03/20/20 1247 03/20/20 1307  BP:    Pulse:    Resp: 16 16  Temp:    SpO2:      Last Pain:  Vitals:   03/20/20 1307  TempSrc:   PainSc: 0-No pain                 Alphonsus Sias

## 2020-03-20 NOTE — Anesthesia Procedure Notes (Signed)
Date/Time: 03/20/2020 12:10 PM Performed by: Allean Found, CRNA Pre-anesthesia Checklist: Emergency Drugs available, Suction available, Patient being monitored, Timeout performed and Patient identified Oxygen Delivery Method: Nasal cannula Placement Confirmation: positive ETCO2

## 2020-03-20 NOTE — H&P (Signed)
[name], MD [address]  [place], [place] [address]  Main: [phone]  Fax: [phone] Pager: [phone]  Primary Care Physician:  Sofie Hartigan, MD Primary Gastroenterologist:  Dr. [name]  Pre-Procedure History & Physical: HPI:  Todd Briggs is a 75 y.o. male is here for an colonoscopy.   Past Medical History:  Diagnosis Date  . Abdominal pain 02/15/2019  . Allergy    Seasonal  . Anemia   . Arthritis   . Basal cell carcinoma    Removed 1980's  . Cataracts, both eyes   . Deaf, left   . Diabetes mellitus without complication (HCC)    Borderline  . Diarrhea 02/21/2019  . GERD (gastroesophageal reflux disease)   . Hypokalemia 02/21/2019  . IBS (irritable bowel syndrome)   . Peptic ulcer 75 years old  . Scarlet fever 75 years old  . Shortness of breath dyspnea    with exertion  . Wears dentures    full upper  . Wears hearing aid in right ear     Past Surgical History:  Procedure Laterality Date  . CATARACT EXTRACTION W/ INTRAOCULAR LENS  IMPLANT, BILATERAL    . CHOLECYSTECTOMY N/A 03/30/2015   Procedure: LAPAROSCOPIC CHOLECYSTECTOMY;  Surgeon: Hubbard Robinson, MD;  Location: [facility] ORS;  Service: General;  Laterality: N/A;  . COLONOSCOPY WITH PROPOFOL N/A 04/15/2019   Procedure: COLONOSCOPY WITH BIOPSY AND DILATION;  Surgeon: [name], MD;  Location: [place];  Service: Endoscopy;  Laterality: N/A;  Priority 3  . HERNIA REPAIR Bilateral 75 years old   Inguinal Hernia    Prior to Admission medications   Medication Sig Start Date End Date Taking? Authorizing Provider  cetirizine (ZYRTEC) 10 MG tablet Take 10 mg by mouth daily.   Yes [provider]  acetaminophen (TYLENOL) 500 MG tablet Take by mouth.    [provider]  colestipol (COLESTID) 1 g tablet Take 2 tablets (2 g total) by mouth 2 (two) times daily. 03/07/20 04/06/20  Lin Landsman, MD  loperamide (IMODIUM) 2 MG capsule Take  by mouth.    [provider]  metroNIDAZOLE (FLAGYL) 500 MG tablet Take 1 tablet (500 mg total) by mouth 2 (two) times daily for 14 days. 03/12/20 03/26/20  Lin Landsman, MD    Allergies as of 03/08/2020 - Review Complete 03/07/2020  Allergen Reaction Noted  . Penicillins Rash 12/05/2013    Family History  Problem Relation Age of Onset  . Hypertension Mother   . Diabetes Mother   . Pancreatitis Mother   . Heart disease Mother   . Heart attack Father   . COPD Father   . Heart disease Father     Social History   Socioeconomic History  . Marital status: Married    Spouse name: Not on file  . Number of children: Not on file  . Years of education: Not on file  . Highest education level: Not on file  Occupational History  . Not on file  Tobacco Use  . Smoking status: Former Smoker    Packs/day: 1.00    Types: Cigarettes    Quit date: 02/28/1985    Years since quitting: 35.0  . Smokeless tobacco: Never Used  Vaping Use  . Vaping Use: Never used  Substance and Sexual Activity  . Alcohol use: No  . Drug use: No  . Sexual activity: Yes    Birth control/protection: None  Other Topics Concern  .  Not on file  Social History Narrative  . Not on file   Social Determinants of Health   Financial Resource Strain: Not on file  Food Insecurity: Not on file  Transportation Needs: Not on file  Physical Activity: Not on file  Stress: Not on file  Social Connections: Not on file  Intimate Partner Violence: Not on file    Review of Systems: See HPI, otherwise negative ROS  Physical Exam: BP (!) 176/85   Pulse 75   Temp (!) 96.5 F (35.8 C) (Temporal)   Resp 17   Ht 5' 10"  (1.778 m)   Wt 69.9 kg   SpO2 99%   BMI 22.10 kg/m  General:   Alert,  pleasant and cooperative in NAD Head:  Normocephalic and atraumatic. Neck:  Supple; no masses or thyromegaly. Lungs:  Clear throughout to auscultation.    Heart:  Regular rate and rhythm. Abdomen:  Soft, nontender  and nondistended. Normal bowel sounds, without guarding, and without rebound.   Neurologic:  Alert and  oriented x4;  grossly normal neurologically.  Impression/Plan: Todd Briggs is here for an colonoscopy to be performed for h/o crohn's disease  Risks, benefits, limitations, and alternatives regarding  colonoscopy have been reviewed with the patient.  Questions have been answered.  All parties agreeable.   Sherri Sear, MD  03/20/2020, 10:53 AM

## 2020-03-20 NOTE — Anesthesia Preprocedure Evaluation (Signed)
Anesthesia Evaluation  Patient identified by MRN, date of birth, ID band Patient awake    Reviewed: Allergy & Precautions, H&P , NPO status , reviewed documented beta blocker date and time   Airway Mallampati: II  TM Distance: >3 FB Neck ROM: limited    Dental  (+) Upper Dentures, Chipped   Pulmonary shortness of breath, former smoker,    Pulmonary exam normal        Cardiovascular Normal cardiovascular exam     Neuro/Psych    GI/Hepatic PUD, GERD  Medicated and Controlled,  Endo/Other  diabetes  Renal/GU      Musculoskeletal  (+) Arthritis ,   Abdominal   Peds  Hematology  (+) Blood dyscrasia, anemia ,   Anesthesia Other Findings Past Medical History: 02/15/2019: Abdominal pain No date: Allergy     Comment:  Seasonal No date: Anemia No date: Arthritis No date: Basal cell carcinoma     Comment:  Removed 1980's No date: Cataracts, both eyes No date: Deaf, left No date: Diabetes mellitus without complication ([place])     Comment:  Borderline 02/21/2019: Diarrhea No date: GERD (gastroesophageal reflux disease) 02/21/2019: Hypokalemia No date: IBS (irritable bowel syndrome) 75 years old: Peptic ulcer 75 years old: Scarlet fever No date: Shortness of breath dyspnea     Comment:  with exertion No date: Wears dentures     Comment:  full upper No date: Wears hearing aid in right ear  Past Surgical History: No date: CATARACT EXTRACTION W/ INTRAOCULAR LENS  IMPLANT, BILATERAL 03/30/2015: CHOLECYSTECTOMY; N/A     Comment:  Procedure: LAPAROSCOPIC CHOLECYSTECTOMY;  Surgeon:               Hubbard Robinson, MD;  Location: [facility] OR[facility];  Service:               General;  Laterality: N/A; 04/15/2019: COLONOSCOPY WITH PROPOFOL; N/A     Comment:  Procedure: COLONOSCOPY WITH BIOPSY AND DILATION;                Surgeon: [name], MD;  Location: [place];  Service: Endoscopy;  Laterality: N/A;   Priority 83 75 years old: HERNIA REPAIR; Bilateral     Comment:  Inguinal Hernia  BMI    Body Mass Index: 22.10 kg/m      Reproductive/Obstetrics                             Anesthesia Physical Anesthesia Plan  ASA: III  Anesthesia Plan: General   Post-op Pain Management:    Induction: Intravenous  PONV Risk Score and Plan: Treatment may vary due to age or medical condition and TIVA  Airway Management Planned: Nasal Cannula and Natural Airway  Additional Equipment:   Intra-op Plan:   Post-operative Plan:   Informed Consent: I have reviewed the patients History and Physical, chart, labs and discussed the procedure including the risks, benefits and alternatives for the proposed anesthesia with the patient or authorized representative who has indicated his/her understanding and acceptance.     Dental Advisory Given  Plan Discussed with: CRNA  Anesthesia Plan Comments:         Anesthesia Quick Evaluation

## 2020-03-21 ENCOUNTER — Encounter: Payer: Self-pay | Admitting: Gastroenterology

## 2020-04-03 ENCOUNTER — Encounter: Payer: Self-pay | Admitting: Gastroenterology

## 2020-04-05 ENCOUNTER — Other Ambulatory Visit: Payer: Self-pay

## 2020-04-05 ENCOUNTER — Ambulatory Visit
Admission: RE | Admit: 2020-04-05 | Discharge: 2020-04-05 | Disposition: A | Discharge status: Home / Self Care | Payer: Medicare HMO | Source: Ambulatory Visit | Attending: Urology | Admitting: Urology

## 2020-04-05 DIAGNOSIS — N2889 Other specified disorders of kidney and ureter: Secondary | ICD-10-CM | POA: Diagnosis not present

## 2020-04-05 MED ORDER — IOHEXOL 300 MG/ML  SOLN
100.0000 mL | Freq: Once | INTRAMUSCULAR | Status: AC | PRN
  Administered 2020-04-05: 100 mL via INTRAVENOUS

## 2020-04-09 ENCOUNTER — Telehealth: Payer: Self-pay | Admitting: Gastroenterology

## 2020-04-09 NOTE — Telephone Encounter (Signed)
Tried to call patient but voicemail is full unable to leave a message

## 2020-04-09 NOTE — Telephone Encounter (Signed)
Patient called regarding expense of medication, please call to advise.

## 2020-04-10 ENCOUNTER — Encounter: Payer: Self-pay | Admitting: Urology

## 2020-04-10 ENCOUNTER — Ambulatory Visit: Payer: 59 | Admitting: Urology

## 2020-04-10 ENCOUNTER — Other Ambulatory Visit: Payer: Self-pay

## 2020-04-10 VITALS — BP 167/87 | HR 68 | Ht 70.0 in | Wt 154.0 lb

## 2020-04-10 DIAGNOSIS — N2889 Other specified disorders of kidney and ureter: Secondary | ICD-10-CM | POA: Diagnosis not present

## 2020-04-10 NOTE — Progress Notes (Signed)
   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  He ultimately underwent resection of an extensive portion of bowel at [facility] in July 2021, and has since been put back in continuity.  We have continued active surveillance for his lower pole lesion with his other comorbidities and major surgeries.  I personally viewed and interpreted his recent CT that shows continued enlargement of the right lower pole mass with definite enhancement now measuring 2.6 x 2.5 cm.  Chest x-ray was ordered but not completed.  We again reviewed treatment options for a small renal mass including active surveillance, percutaneous cryoablation, partial nephrectomy, or radical nephrectomy.  He would not be a good candidate for partial nephrectomy in the setting of his extensive recent abdominal surgeries and bowel resections.  I recommended referral to interventional radiology to at least consider percutaneous cryoablation with the growth of his this lesion.  At this time he would like to continue active surveillance and is not amenable to referral to IR or any procedures, as he has had extensive medical costs related to his recent surgeries and does not want to prefer further evaluation or treatment at this time.  After extensive conversation, he was amenable to following up with a renal ultrasound and chest x-ray in 6 months for ongoing active surveillance.  -Patient deferred referral to IR to consider percutaneous cryoablation of 2.5 cm right renal mass -Continue active surveillance, RTC 6 months with renal US and CXR prior   Billey Co, MD  [place] [address] [place], [place] [address] [phone]

## 2020-04-10 NOTE — Patient Instructions (Signed)

## 2020-04-11 ENCOUNTER — Telehealth: Payer: Self-pay

## 2020-04-11 NOTE — Telephone Encounter (Signed)
Patient states the Humira is 33,000 dollars and he can not afford this medication. Patient wants to know if there is anything else he can use    [phone]

## 2020-04-11 NOTE — Telephone Encounter (Signed)
Informed patient of this information. Left patient a detail message

## 2020-04-11 NOTE — Telephone Encounter (Signed)
Filled out new paper work for [employer] faxed to [facility] and sent [name] a email to informed her of it.

## 2020-04-11 NOTE — Telephone Encounter (Signed)
Let's apply for entyvio  RV

## 2020-04-13 ENCOUNTER — Telehealth: Payer: Self-pay

## 2020-04-13 ENCOUNTER — Other Ambulatory Visit: Payer: Self-pay | Admitting: Urology

## 2020-04-13 DIAGNOSIS — N2889 Other specified disorders of kidney and ureter: Secondary | ICD-10-CM

## 2020-04-13 NOTE — Telephone Encounter (Signed)
Incoming call on triage line from patient in regards to referral to IR from his visit on 04/10/20. Patient wanted to let Dr Diamantina Providence know he has changed his mind and would like to proceed with the referral to IR for his right renal mass.

## 2020-04-13 NOTE — Progress Notes (Signed)
Patient left message that he is now interested in previously discussed referral to interventional radiology for evaluation of percutaneous ablation of 2.5 cm enhancing right renal mass.  Referral placed.  Todd Madrid, MD 04/13/2020

## 2020-04-13 NOTE — Telephone Encounter (Signed)
I placed a referral to interventional radiology to consider percutaneous ablation of the right renal mass, can you facilitate?  Thanks   Nickolas Madrid, MD 04/13/2020

## 2020-04-16 ENCOUNTER — Other Ambulatory Visit: Payer: Self-pay | Admitting: Radiology

## 2020-04-16 DIAGNOSIS — N2889 Other specified disorders of kidney and ureter: Secondary | ICD-10-CM

## 2020-04-18 ENCOUNTER — Telehealth: Payer: Self-pay | Admitting: *Deleted

## 2020-04-18 NOTE — Telephone Encounter (Signed)
They were calling to find out how often the labs he needed to be done. I informed her every infusion

## 2020-04-18 NOTE — Telephone Encounter (Signed)
Jinny Blossom w/ [facility] # [phone] lvm to clarify orders, Please call her.

## 2020-04-24 ENCOUNTER — Other Ambulatory Visit: Payer: Self-pay

## 2020-04-24 ENCOUNTER — Ambulatory Visit
Admission: RE | Admit: 2020-04-24 | Discharge: 2020-04-24 | Disposition: A | Discharge status: Home / Self Care | Payer: Medicare HMO | Source: Ambulatory Visit | Attending: Urology | Admitting: Urology

## 2020-04-24 ENCOUNTER — Telehealth: Payer: Self-pay

## 2020-04-24 DIAGNOSIS — N2889 Other specified disorders of kidney and ureter: Secondary | ICD-10-CM

## 2020-04-24 HISTORY — PX: IR RADIOLOGIST EVAL & MGMT: IMG5224

## 2020-04-24 NOTE — Telephone Encounter (Signed)
Per [name] He has high copay due to his insurance so working with [employer] Rod[employer] to see if can get assistance.  Please see below.  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

## 2020-05-01 ENCOUNTER — Encounter: Payer: Self-pay | Admitting: *Deleted

## 2020-05-01 ENCOUNTER — Other Ambulatory Visit (HOSPITAL_COMMUNITY): Payer: Self-pay | Admitting: Interventional Radiology

## 2020-05-01 DIAGNOSIS — N2889 Other specified disorders of kidney and ureter: Secondary | ICD-10-CM

## 2020-05-01 NOTE — Consult Note (Signed)
Chief Complaint: Patient was consulted remotely today (TeleHealth) for right renal mass at the request of Sninsky,Brian C.    Referring Physician(s): Sninsky,Brian C  History of Present Illness: Todd Briggs is a 75 y.o. male with history of irritable bowel syndrome, presented 02/15/2019 with abdominal pain, CT demonstrated inflammatory ileitis without abscess, and incidental note made of a 2 cm right renal lesion. 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 His presenting symptoms of progressed with perforation requiring percutaneous drain placement, and July 2021 bowel resection with ostomy, subsequently taken down. 04/05/2020 CT shows interval enlargement of the right lower pole renal mass now 2.5 x 2.6 cm.  No renal vein involvement or regional adenopathy. He presents for discussion of treatment options.   Past Medical History:  Diagnosis Date  . Abdominal pain 02/15/2019  . Allergy    Seasonal  . Anemia   . Arthritis   . Basal cell carcinoma    Removed 1980's  . Cataracts, both eyes   . Deaf, left   . Diarrhea 02/21/2019  . GERD (gastroesophageal reflux disease)   . Hypokalemia 02/21/2019  . IBS (irritable bowel syndrome)   . Peptic ulcer 75 years old  . Scarlet fever 75 years old  . Shortness of breath dyspnea    with exertion  . Wears dentures    full upper  . Wears hearing aid in right ear     Past Surgical History:  Procedure Laterality Date  . CATARACT EXTRACTION W/ INTRAOCULAR LENS  IMPLANT, BILATERAL    . CHOLECYSTECTOMY N/A 03/30/2015   Procedure: LAPAROSCOPIC CHOLECYSTECTOMY;  Surgeon: Hubbard Robinson, MD;  Location: [facility];  Service: General;  Laterality: N/A;  . COLONOSCOPY WITH PROPOFOL N/A 04/15/2019   Procedure: COLONOSCOPY WITH BIOPSY AND DILATION;  Surgeon: [name], MD;  Location: [place];  Service: Endoscopy;  Laterality: N/A;  Priority 3   . COLONOSCOPY WITH PROPOFOL N/A 03/20/2020   Procedure: COLONOSCOPY WITH PROPOFOL;  Surgeon: Lin Landsman, MD;  Location: [facility];  Service: Gastroenterology;  Laterality: N/A;  . HERNIA REPAIR Bilateral 75 years old   Inguinal Hernia    Allergies: Penicillins  Medications: Prior to Admission medications   Medication Sig Start Date End Date Taking? Authorizing Provider  acetaminophen (TYLENOL) 500 MG tablet Take by mouth.    [provider]  cetirizine (ZYRTEC) 10 MG tablet Take 10 mg by mouth daily.    [provider]  colestipol (COLESTID) 1 g tablet Take 2 tablets (2 g total) by mouth 2 (two) times daily. 03/07/20 04/06/20  Lin Landsman, MD  loperamide (IMODIUM) 2 MG capsule Take by mouth.    [provider]     Family History  Problem Relation Age of Onset  . Hypertension Mother   . Diabetes Mother   . Pancreatitis Mother   . Heart disease Mother   . Heart attack Father   . COPD Father   . Heart disease Father     Social History   Socioeconomic History  . Marital status: Married    Spouse name: Not on file  . Number of children: Not on file  . Years of education: Not on file  . Highest education level: Not on file  Occupational History  . Not on file  Tobacco Use  . Smoking status: Former Smoker    Packs/day: 1.00    Types: Cigarettes  Quit date: 02/28/1985    Years since quitting: 35.1  . Smokeless tobacco: Never Used  Vaping Use  . Vaping Use: Never used  Substance and Sexual Activity  . Alcohol use: No  . Drug use: No  . Sexual activity: Yes    Birth control/protection: None  Other Topics Concern  . Not on file  Social History Narrative  . Not on file   Social Determinants of Health   Financial Resource Strain: Not on file  Food Insecurity: Not on file  Transportation Needs: Not on file  Physical Activity: Not on file  Stress: Not on file  Social Connections: Not on file    ECOG Status: 0 -  Asymptomatic  Review of Systems  Review of Systems: A 12 point ROS discussed and pertinent positives are indicated in the HPI above.  All other systems are negative.  Physical Exam No direct physical exam was performed (except for noted visual exam findings with Video Visits).     Vital Signs: There were no vitals taken for this visit.  Imaging: CT Abdomen Pelvis W Wo Contrast  Result Date: 04/05/2020 CLINICAL DATA:  Right renal mass EXAM: CT ABDOMEN AND PELVIS WITHOUT AND WITH CONTRAST TECHNIQUE: Multidetector CT imaging of the abdomen and pelvis was performed following the standard protocol before and following the bolus administration of intravenous contrast. CONTRAST:  128m OMNIPAQUE IOHEXOL 300 MG/ML  SOLN COMPARISON:  03/09/2019 FINDINGS: Lower chest: No acute abnormality. Hepatobiliary: No solid liver abnormality is seen. No gallstones, gallbladder wall thickening, or biliary dilatation. Pancreas: Unremarkable. No pancreatic ductal dilatation or surrounding inflammatory changes. Spleen: Normal in size without significant abnormality. Adrenals/Urinary Tract: Stable, definitively benign subcentimeter fat containing adrenal adenomata. Interval enlargement of a brightly contrast enhancing mass of the inferior pole of the right kidney, measuring 2.6 x 2.5 cm, previously 2.2 x 2.2 cm (series 6, image 61) bladder is unremarkable. Stomach/Bowel: Stomach is within normal limits. Status post terminal ileocolectomy and reanastomosis. Colon is diffusely thickened with vascular combing and minimal adjacent fat stranding. Vascular/Lymphatic: Aortic atherosclerosis. No enlarged abdominal or pelvic lymph nodes. Reproductive: No mass or other significant abnormality. Other: No abdominal wall hernia or abnormality. No abdominopelvic ascites. Musculoskeletal: No acute or significant osseous findings. IMPRESSION: 1. Interval enlargement of a brightly contrast enhancing mass of the inferior pole of the right  kidney, measuring 2.6 x 2.5 cm, previously 2.2 x 2.2 cm. Findings are consistent with renal cell carcinoma. No evidence of renal vein invasion or lymphadenopathy in the abdomen or pelvis. 2. Status post terminal ileocolectomy and reanastomosis. Diffusely inflammatory appearance of the colon, generally in keeping with Crohn's disease or ulcerative colitis. Aortic Atherosclerosis (ICD10-I70.0). Electronically Signed   By: AEddie CandleM.D.   On: 04/05/2020 16:28    Labs:  CBC: Recent Labs    06/29/19 1457 10/21/19 1117 03/07/20 1516  WBC 8.2 7.9 6.1  HGB 13.6 13.5 11.8*  HCT 41.0 41.8 35.7*  PLT 268 282 255    COAGS: No results for input(s): INR, APTT in the last 8760 hours.  BMP: Recent Labs    03/07/20 1516  NA 142  K 3.6  CL 105  CO2 24  GLUCOSE 94  BUN 18  CALCIUM 8.7  CREATININE 1.07  GFRNONAA 67  GFRAA 78    LIVER FUNCTION TESTS: Recent Labs    03/07/20 1516  BILITOT 0.3  AST 11  ALT 6  ALKPHOS 62  PROT 6.1  ALBUMIN 4.1    TUMOR MARKERS: No results  for input(s): AFPTM, CEA, CA199, CHROMGRNA in the last 8760 hours.  Assessment and Plan:  My impression is that this patient has a enlarging   2.6 cm right lower pole renal mass concerning for   renal cell carcinoma.  I discussed this with the patient and his spouse.  We discussed this in detail and in regards to the spectrum of renal masses which includes cysts (pure cysts are considered benign), solid masses and everything in between. The risk of metastasis increases as the size of solid renal mass increases. In general, it is believed that the risk of metastasis for renal masses less than 3-4 cm is small (up to approximately 5%) based mainly on large retrospective studies. In some cases and especially in patients of older age and multiple comorbidities a surveillance approach may be appropriate.  Active surveillance could also be considered given that this lesion is less than 3 cm to help further assess  growth rate, etc, although it has already demonstrated growth since 2020. The treatment of solid renal masses includes:  cryoablation (percutaneous and laparoscopic) in addition to partial and complete nephrectomy (each with option of laparoscopic, robotic and open depending on appropriateness). Furthermore, nephrectomy appears to be an independent risk factor for the development of chronic kidney disease suggesting that nephron sparing approaches should be implored whenever feasible. We reviewed these options in context of the patients current situation as well as the pros and cons of each. We had   discussion today about the risk and benefits of each. We discussed in particular the percutaneous cryoablation procedure with CT guidance under anesthesia, anticipated benefits, possible risks and complications, expected postoperative course, likelihood of concurrent percutaneous core biopsy to get a pathologic specimen, and need for continued imaging follow-up assuming this is demonstrated to be carcinoma. The patient seemed to understand and did ask appropriate questions.  The patient had had a good discussion with Dr. Diamantina Providence already about this.  The patient is motivated to proceed. Accordingly, we can set  up for CT-guided right renal mass cryoablation and core biopsy under anesthesia, hopefully at [facility]  hospital if we can get the equipment there, at his convenience.  Otherwise, we will have to ablate at [facility] long.   Thank you for this interesting consult.  I greatly enjoyed meeting CREED KAIL and look forward to participating in their care.  A copy of this report was sent to the requesting provider on this date.  Electronically Signed: Rickard Rhymes 05/01/2020, 1:18 PM   I spent a total of 40 Minutes    in remote  clinical consultation, greater than 50% of which was counseling/coordinating care for right lower pole renal mass.    Visit type: Audio only (telephone). Audio  (no video) only due to patient's lack of internet/smartphone capability. Alternative for in-person consultation at [facility], [address], [place], [place]. This visit type was conducted due to national recommendations for restrictions regarding the COVID-19 Pandemic (e.g. social distancing).  This format is felt to be most appropriate for this patient at this time.  All issues noted in this document were discussed and addressed.

## 2020-05-08 ENCOUNTER — Telehealth: Payer: Self-pay

## 2020-05-08 ENCOUNTER — Other Ambulatory Visit (HOSPITAL_COMMUNITY): Payer: Self-pay | Admitting: Interventional Radiology

## 2020-05-08 DIAGNOSIS — N2889 Other specified disorders of kidney and ureter: Secondary | ICD-10-CM

## 2020-05-08 NOTE — Telephone Encounter (Signed)
[name]  Please call patient or his wife and let them know we can get Todd Briggs which is an infusion.  Please update them about what [name] has informed us  Thanks RV

## 2020-05-08 NOTE — Telephone Encounter (Signed)
Per [name]: looks like Todd Briggs has cancelled his referral so assuming he spoke with the financial people and couldn't help.  I did reach out to ask rep who said since has federal insurance could get better coverage at outpatient for his.  I know he preferred at home but may have better coverage to try.  I don't want to call wife back since note below says to not call back but she may be interested if relayed from the doctor office.  They are in [place] and closest one I could find is [facility] in [place].  If they decide to pursue that route here is their information and I called there and they take her insurance.  Please advised we also tried Humira and it was to expensive

## 2020-05-08 NOTE — Telephone Encounter (Signed)
Patient states the home infusion is 3400 dollars and then they came back and said they got it down to 1100 dollars. She states that he can not afford it. He states having it done at the infusion place was 1100 dollars. He states he read the side effects and is not wanting to take it anyway. He states the colestipol is helping hi diarrhea please advised

## 2020-05-22 ENCOUNTER — Other Ambulatory Visit: Payer: Self-pay | Admitting: Radiology

## 2020-05-22 NOTE — Progress Notes (Signed)
Called and request pre op orders.

## 2020-06-01 NOTE — Patient Instructions (Addendum)
DUE TO COVID-19 ONLY ONE VISITOR IS ALLOWED TO COME WITH YOU AND STAY IN THE WAITING ROOM ONLY DURING PRE OP AND PROCEDURE DAY OF SURGERY. THE 1 VISITOR  MAY VISIT WITH YOU AFTER SURGERY IN YOUR PRIVATE ROOM DURING VISITING HOURS ONLY!  YOU NEED TO HAVE A COVID 19 TEST ON__4/11_____ @_2 :35 pm______, THIS TEST MUST BE DONE BEFORE SURGERY,  COVID TESTING SITE [place] [address], IT IS ON THE RIGHT GOING OUT [address] APPROXIMATELY  2 MINUTES PAST [employer] ON THE RIGHT. ONCE YOUR COVID TEST IS COMPLETED,  PLEASE BEGIN THE QUARANTINE INSTRUCTIONS AS OUTLINED IN YOUR HANDOUT.                JACOBS GOLAB   Your procedure is scheduled on: 06/06/20   Report to [facility] Main  Entrance   Report to admitting at  10:00 AM     Call this number if you have problems the morning of surgery [phone]    Remember: Do not eat food or drink liquids :After Midnight.   BRUSH YOUR TEETH MORNING OF SURGERY AND RINSE YOUR MOUTH OUT, NO CHEWING GUM CANDY OR MINTS.     Take these medicines the morning of surgery with A SIP OF WATER: none                                 You may not have any metal on your body including               piercings  Do not wear jewelry,  lotions, powders or deodorant               Men may shave face and neck.   Do not bring valuables to the hospital. [place].  Contacts, dentures or bridgework may not be worn into surgery.  Leave suitcase in the car. After surgery it may be brought to your room.    Special Instructions: N/A              Please read over the following fact sheets you were given: _____________________________________________________________________             [facility] - Preparing for Surgery Before surgery, you can play an important role.  Because skin is not sterile, your skin needs to be as free of germs as possible.  You can reduce the number  of germs on your skin by washing with CHG (chlorahexidine gluconate) soap before surgery.  CHG is an antiseptic cleaner which kills germs and bonds with the skin to continue killing germs even after washing. Please DO NOT use if you have an allergy to CHG or antibacterial soaps.  If your skin becomes reddened/irritated stop using the CHG and inform your nurse when you arrive at Short Stay.   You may shave your face/neck.  Please follow these instructions carefully:  1.  Shower with CHG Soap the night before surgery and the  morning of Surgery.  2.  If you choose to wash your hair, wash your hair first as usual with your  normal  shampoo.  3.  After you shampoo, rinse your hair and body thoroughly to remove the  shampoo.  4.  Use CHG as you would any other liquid soap.  You can apply chg directly  to the skin and wash                       Gently with a scrungie or clean washcloth.  5.  Apply the CHG Soap to your body ONLY FROM THE NECK DOWN.   Do not use on face/ open                           Wound or open sores. Avoid contact with eyes, ears mouth and genitals (private parts).                       Wash face,  Genitals (private parts) with your normal soap.             6.  Wash thoroughly, paying special attention to the area where your surgery  will be performed.  7.  Thoroughly rinse your body with warm water from the neck down.  8.  DO NOT shower/wash with your normal soap after using and rinsing off  the CHG Soap.                9.  Pat yourself dry with a clean towel.            10.  Wear clean pajamas.            11.  Place clean sheets on your bed the night of your first shower and do not  sleep with pets. Day of Surgery : Do not apply any lotions/deodorants the morning of surgery.  Please wear clean clothes to the hospital/surgery center.  FAILURE TO FOLLOW THESE INSTRUCTIONS MAY RESULT IN THE CANCELLATION OF YOUR SURGERY PATIENT  SIGNATURE_________________________________  NURSE SIGNATURE__________________________________  ________________________________________________________________________

## 2020-06-04 ENCOUNTER — Encounter (HOSPITAL_COMMUNITY): Payer: Self-pay

## 2020-06-04 ENCOUNTER — Encounter (HOSPITAL_COMMUNITY)
Admission: RE | Admit: 2020-06-04 | Discharge: 2020-06-04 | Disposition: A | Discharge status: Home / Self Care | Payer: Medicare HMO | Source: Ambulatory Visit | Attending: Interventional Radiology | Admitting: Interventional Radiology

## 2020-06-04 ENCOUNTER — Other Ambulatory Visit (HOSPITAL_COMMUNITY)
Admission: RE | Admit: 2020-06-04 | Discharge: 2020-06-04 | Disposition: A | Discharge status: Home / Self Care | Payer: Medicare HMO | Source: Ambulatory Visit | Attending: Interventional Radiology | Admitting: Interventional Radiology

## 2020-06-04 ENCOUNTER — Ambulatory Visit (HOSPITAL_COMMUNITY)
Admission: RE | Admit: 2020-06-04 | Discharge: 2020-06-04 | Disposition: A | Discharge status: Home / Self Care | Payer: Medicare HMO | Source: Ambulatory Visit | Attending: Radiology | Admitting: Radiology

## 2020-06-04 ENCOUNTER — Other Ambulatory Visit: Payer: Self-pay

## 2020-06-04 DIAGNOSIS — Z20822 Contact with and (suspected) exposure to covid-19: Secondary | ICD-10-CM | POA: Insufficient documentation

## 2020-06-04 DIAGNOSIS — Z01818 Encounter for other preprocedural examination: Secondary | ICD-10-CM

## 2020-06-04 DIAGNOSIS — Z01812 Encounter for preprocedural laboratory examination: Secondary | ICD-10-CM | POA: Insufficient documentation

## 2020-06-04 HISTORY — DX: Diverticulitis of intestine, part unspecified, without perforation or abscess without bleeding: K57.92

## 2020-06-04 HISTORY — DX: Chronic kidney disease, unspecified: N18.9

## 2020-06-04 LAB — CBC WITH DIFFERENTIAL/PLATELET
Abs Immature Granulocytes: 0.01 10*3/uL (ref 0.00–0.07)
Basophils Absolute: 0 10*3/uL (ref 0.0–0.1)
Basophils Relative: 1 %
Eosinophils Absolute: 0.1 10*3/uL (ref 0.0–0.5)
Eosinophils Relative: 2 %
HCT: 39.8 % (ref 39.0–52.0)
Hemoglobin: 13 g/dL (ref 13.0–17.0)
Immature Granulocytes: 0 %
Lymphocytes Relative: 28 %
Lymphs Abs: 1.8 10*3/uL (ref 0.7–4.0)
MCH: 28.3 pg (ref 26.0–34.0)
MCHC: 32.7 g/dL (ref 30.0–36.0)
MCV: 86.7 fL (ref 80.0–100.0)
Monocytes Absolute: 0.5 10*3/uL (ref 0.1–1.0)
Monocytes Relative: 7 %
Neutro Abs: 4.1 10*3/uL (ref 1.7–7.7)
Neutrophils Relative %: 62 %
Platelets: 228 10*3/uL (ref 150–400)
RBC: 4.59 MIL/uL (ref 4.22–5.81)
RDW: 14.6 % (ref 11.5–15.5)
WBC: 6.5 10*3/uL (ref 4.0–10.5)
nRBC: 0 % (ref 0.0–0.2)

## 2020-06-04 LAB — PROTIME-INR
INR: 1 (ref 0.8–1.2)
Prothrombin Time: 13 seconds (ref 11.4–15.2)

## 2020-06-04 LAB — BASIC METABOLIC PANEL
Anion gap: 6 (ref 5–15)
BUN: 17 mg/dL (ref 8–23)
CO2: 29 mmol/L (ref 22–32)
Calcium: 8.6 mg/dL — ABNORMAL LOW (ref 8.9–10.3)
Chloride: 106 mmol/L (ref 98–111)
Creatinine, Ser: 1.13 mg/dL (ref 0.61–1.24)
GFR, Estimated: >60 mL/min (ref >60)
Glucose, Bld: 92 mg/dL (ref 70–99)
Potassium: 4 mmol/L (ref 3.5–5.1)
Sodium: 141 mmol/L (ref 135–145)

## 2020-06-04 NOTE — Progress Notes (Addendum)
COVID Vaccine Completed:yes Date COVID Vaccine completed:4/19- booster 03/21/20 COVID vaccine manufacturer:   Moderna     PCP - Dr. Truddie Coco Cardiologist - none  Chest x-ray - 06/04/20-epic EKG - 06/04/20-chart , epic Stress Test - no ECHO - no Cardiac Cath - no Pacemaker/ICD device last checked:NA  Sleep Study - no CPAP -   Fasting Blood Sugar - NA Checks Blood Sugar _____ times a day  Blood Thinner Instructions:NA Aspirin Instructions: Last Dose:  Anesthesia review:   Patient denies shortness of breath, fever, cough and chest pain at PAT appointment yes  Patient verbalized understanding of instructions that were given to them at the PAT appointment. Patient was also instructed that they will need to review over the PAT instructions again at home before surgery.yes Pt reports no SOB with any activities.  Pt had bowel surgery in July of 2021 and reports complications.

## 2020-06-05 ENCOUNTER — Other Ambulatory Visit: Payer: Self-pay | Admitting: Student

## 2020-06-05 ENCOUNTER — Other Ambulatory Visit: Payer: Self-pay | Admitting: Radiology

## 2020-06-05 LAB — SARS CORONAVIRUS 2 (TAT 6-24 HRS): SARS Coronavirus 2: NEGATIVE

## 2020-06-06 ENCOUNTER — Observation Stay (HOSPITAL_COMMUNITY)
Admission: RE | Admit: 2020-06-06 | Discharge: 2020-06-06 | Disposition: A | Discharge status: Home / Self Care | Payer: Medicare HMO | Source: Ambulatory Visit | Attending: Interventional Radiology | Admitting: Interventional Radiology

## 2020-06-06 ENCOUNTER — Encounter (HOSPITAL_COMMUNITY)
Admission: RE | Disposition: A | Discharge status: Home / Self Care | Payer: Self-pay | Source: Home / Self Care | Attending: Interventional Radiology

## 2020-06-06 ENCOUNTER — Other Ambulatory Visit: Payer: Self-pay

## 2020-06-06 ENCOUNTER — Observation Stay (HOSPITAL_COMMUNITY)
Admission: RE | Admit: 2020-06-06 | Discharge: 2020-06-07 | Disposition: A | Discharge status: Home / Self Care | Payer: Medicare HMO | Attending: Interventional Radiology | Admitting: Interventional Radiology

## 2020-06-06 ENCOUNTER — Encounter (HOSPITAL_COMMUNITY): Payer: Self-pay

## 2020-06-06 ENCOUNTER — Ambulatory Visit (HOSPITAL_COMMUNITY): Payer: Medicare HMO | Admitting: Certified Registered Nurse Anesthetist

## 2020-06-06 ENCOUNTER — Encounter (HOSPITAL_COMMUNITY): Payer: Self-pay | Admitting: Interventional Radiology

## 2020-06-06 DIAGNOSIS — Z9889 Other specified postprocedural states: Secondary | ICD-10-CM

## 2020-06-06 DIAGNOSIS — N2889 Other specified disorders of kidney and ureter: Principal | ICD-10-CM | POA: Insufficient documentation

## 2020-06-06 DIAGNOSIS — Z79899 Other long term (current) drug therapy: Secondary | ICD-10-CM | POA: Diagnosis not present

## 2020-06-06 DIAGNOSIS — Z880 Allergy status to penicillin: Secondary | ICD-10-CM | POA: Insufficient documentation

## 2020-06-06 HISTORY — PX: RADIOLOGY WITH ANESTHESIA: SHX6223

## 2020-06-06 LAB — TYPE AND SCREEN
ABO/RH(D): O POS
Antibody Screen: NEGATIVE

## 2020-06-06 LAB — ABO/RH: ABO/RH(D): O POS

## 2020-06-06 SURGERY — RADIOLOGY WITH ANESTHESIA
Anesthesia: General | Laterality: Right

## 2020-06-06 MED ORDER — ACETAMINOPHEN 500 MG PO TABS
1000.0000 mg | ORAL_TABLET | Freq: Once | ORAL | Status: AC
  Administered 2020-06-06: 1000 mg via ORAL
  Filled 2020-06-06: qty 2

## 2020-06-06 MED ORDER — HYDRALAZINE HCL 20 MG/ML IJ SOLN
10.0000 mg | Freq: Once | INTRAMUSCULAR | Status: AC
  Administered 2020-06-06: 10 mg via INTRAVENOUS

## 2020-06-06 MED ORDER — HYDROCODONE-ACETAMINOPHEN 5-325 MG PO TABS
1.0000 | ORAL_TABLET | ORAL | Status: DC | PRN
  Administered 2020-06-06: 1 via ORAL
  Filled 2020-06-06: qty 1

## 2020-06-06 MED ORDER — SODIUM CHLORIDE 0.9 % IV SOLN
INTRAVENOUS | Status: AC
  Filled 2020-06-06: qty 500

## 2020-06-06 MED ORDER — LACTATED RINGERS IV SOLN: INTRAVENOUS | Status: DC

## 2020-06-06 MED ORDER — PHENYLEPHRINE HCL-NACL 10-0.9 MG/250ML-% IV SOLN
INTRAVENOUS | Status: DC | PRN
  Administered 2020-06-06: 20 ug/min via INTRAVENOUS

## 2020-06-06 MED ORDER — FENTANYL CITRATE (PF) 100 MCG/2ML IJ SOLN
INTRAMUSCULAR | Status: DC | PRN
  Administered 2020-06-06: 100 ug via INTRAVENOUS
  Administered 2020-06-06: 50 ug via INTRAVENOUS

## 2020-06-06 MED ORDER — DEXAMETHASONE SODIUM PHOSPHATE 10 MG/ML IJ SOLN
INTRAMUSCULAR | Status: DC | PRN
  Administered 2020-06-06: 8 mg via INTRAVENOUS

## 2020-06-06 MED ORDER — PROPOFOL 10 MG/ML IV BOLUS
INTRAVENOUS | Status: DC | PRN
  Administered 2020-06-06: 150 mg via INTRAVENOUS

## 2020-06-06 MED ORDER — ONDANSETRON HCL 4 MG/2ML IJ SOLN
INTRAMUSCULAR | Status: DC | PRN
  Administered 2020-06-06: 4 mg via INTRAVENOUS

## 2020-06-06 MED ORDER — HYDRALAZINE HCL 20 MG/ML IJ SOLN
INTRAMUSCULAR | Status: DC | PRN
  Administered 2020-06-06: 2 mg via INTRAVENOUS

## 2020-06-06 MED ORDER — EPHEDRINE SULFATE-NACL 50-0.9 MG/10ML-% IV SOSY
PREFILLED_SYRINGE | INTRAVENOUS | Status: DC | PRN
  Administered 2020-06-06: 7.5 mg via INTRAVENOUS

## 2020-06-06 MED ORDER — ALBUMIN HUMAN 5 % IV SOLN
INTRAVENOUS | Status: AC
  Filled 2020-06-06: qty 250

## 2020-06-06 MED ORDER — FENTANYL CITRATE (PF) 100 MCG/2ML IJ SOLN
INTRAMUSCULAR | Status: AC
  Filled 2020-06-06: qty 2

## 2020-06-06 MED ORDER — VASOPRESSIN 20 UNIT/ML IV SOLN
INTRAVENOUS | Status: AC
  Filled 2020-06-06: qty 1

## 2020-06-06 MED ORDER — CHLORHEXIDINE GLUCONATE 0.12 % MT SOLN: 15.0000 mL | Freq: Once | OROMUCOSAL | Status: AC

## 2020-06-06 MED ORDER — HYDRALAZINE HCL 20 MG/ML IJ SOLN
INTRAMUSCULAR | Status: AC
  Filled 2020-06-06: qty 1

## 2020-06-06 MED ORDER — SUGAMMADEX SODIUM 200 MG/2ML IV SOLN
INTRAVENOUS | Status: DC | PRN
  Administered 2020-06-06: 200 mg via INTRAVENOUS

## 2020-06-06 MED ORDER — DOCUSATE SODIUM 100 MG PO CAPS
100.0000 mg | ORAL_CAPSULE | Freq: Two times a day (BID) | ORAL | Status: DC
  Filled 2020-06-06: qty 1

## 2020-06-06 MED ORDER — FENTANYL CITRATE (PF) 100 MCG/2ML IJ SOLN
25.0000 ug | INTRAMUSCULAR | Status: DC | PRN
  Administered 2020-06-06 (×2): 50 ug via INTRAVENOUS

## 2020-06-06 MED ORDER — IOHEXOL 300 MG/ML  SOLN
100.0000 mL | Freq: Once | INTRAMUSCULAR | Status: DC | PRN
  Administered 2020-06-06: 150 mL via INTRAVENOUS

## 2020-06-06 MED ORDER — ONDANSETRON HCL 4 MG/2ML IJ SOLN: 4.0000 mg | Freq: Once | INTRAMUSCULAR | Status: DC | PRN

## 2020-06-06 MED ORDER — ORAL CARE MOUTH RINSE
15.0000 mL | Freq: Once | OROMUCOSAL | Status: AC
  Administered 2020-06-06: 15 mL via OROMUCOSAL

## 2020-06-06 MED ORDER — PHENYLEPHRINE 40 MCG/ML (10ML) SYRINGE FOR IV PUSH (FOR BLOOD PRESSURE SUPPORT)
PREFILLED_SYRINGE | INTRAVENOUS | Status: DC | PRN
  Administered 2020-06-06 (×3): 80 ug via INTRAVENOUS
  Administered 2020-06-06: 120 ug via INTRAVENOUS

## 2020-06-06 MED ORDER — COLESTIPOL HCL 1 G PO TABS
1.0000 g | ORAL_TABLET | Freq: Two times a day (BID) | ORAL | Status: DC
  Filled 2020-06-06 (×2): qty 1

## 2020-06-06 MED ORDER — LIDOCAINE 2% (20 MG/ML) 5 ML SYRINGE
INTRAMUSCULAR | Status: DC | PRN
  Administered 2020-06-06: 80 mg via INTRAVENOUS

## 2020-06-06 MED ORDER — SENNOSIDES-DOCUSATE SODIUM 8.6-50 MG PO TABS: 1.0000 | ORAL_TABLET | Freq: Every day | ORAL | Status: DC | PRN

## 2020-06-06 MED ORDER — ACETAMINOPHEN 500 MG PO TABS: 500.0000 mg | ORAL_TABLET | Freq: Three times a day (TID) | ORAL | Status: DC | PRN

## 2020-06-06 MED ORDER — ONDANSETRON HCL 4 MG/2ML IJ SOLN: 4.0000 mg | Freq: Four times a day (QID) | INTRAMUSCULAR | Status: DC | PRN

## 2020-06-06 MED ORDER — ROCURONIUM BROMIDE 10 MG/ML (PF) SYRINGE
PREFILLED_SYRINGE | INTRAVENOUS | Status: DC | PRN
  Administered 2020-06-06: 20 mg via INTRAVENOUS
  Administered 2020-06-06: 60 mg via INTRAVENOUS
  Administered 2020-06-06: 20 mg via INTRAVENOUS

## 2020-06-06 MED ORDER — LORATADINE 10 MG PO TABS
10.0000 mg | ORAL_TABLET | Freq: Every day | ORAL | Status: DC
  Filled 2020-06-06: qty 1

## 2020-06-06 MED ORDER — FENTANYL CITRATE (PF) 250 MCG/5ML IJ SOLN
INTRAMUSCULAR | Status: AC
  Filled 2020-06-06: qty 5

## 2020-06-06 NOTE — Procedures (Signed)
Interventional Radiology Procedure Note  Procedure: CT guided cryoablation of right renal mass  Indication: Right renal mass  Findings: Please refer to procedural dictation for full description.  Complications: None  EBL: < 10 mL  Miachel Roux, MD [phone]

## 2020-06-06 NOTE — Transfer of Care (Signed)
Immediate Anesthesia Transfer of Care Note  Patient: Todd Briggs  Procedure(s) Performed: RADIOLOGY WITH ANESTHESIA  CRYOABLATION OF RIGHT RENAL MASS (Right )  Patient Location: PACU  Anesthesia Type:General  Level of Consciousness: awake, oriented, drowsy and patient cooperative  Airway & Oxygen Therapy: Patient Spontanous Breathing and Patient connected to face mask oxygen  Post-op Assessment: Report given to RN and Post -op Vital signs reviewed and stable  Post vital signs: Reviewed and stable  Last Vitals:  Vitals Value Taken Time  BP 161/98 06/06/20 1600  Temp    Pulse 76 06/06/20 1608  Resp 13 06/06/20 1608  SpO2 100 % 06/06/20 1608  Vitals shown include unvalidated device data.  Last Pain:  Vitals:   06/06/20 1600  TempSrc:   PainSc: 8       Patients Stated Pain Goal: 0 (75/17/00 1749)  Complications: No complications documented.

## 2020-06-06 NOTE — Anesthesia Postprocedure Evaluation (Signed)
Anesthesia Post Note  Patient: Todd Briggs  Procedure(s) Performed: RADIOLOGY WITH ANESTHESIA  CRYOABLATION OF RIGHT RENAL MASS (Right )     Patient location during evaluation: PACU Anesthesia Type: General Level of consciousness: awake and alert and awake Pain management: pain level controlled Vital Signs Assessment: post-procedure vital signs reviewed and stable Respiratory status: spontaneous breathing, nonlabored ventilation, respiratory function stable and patient connected to nasal cannula oxygen Cardiovascular status: blood pressure returned to baseline and stable Postop Assessment: no apparent nausea or vomiting Anesthetic complications: no   No complications documented.  Last Vitals:  Vitals:   06/06/20 1715 06/06/20 1730  BP: (!) 149/79 (!) 159/85  Pulse: 95 97  Resp: 20 15  Temp: 36.7 C 36.7 C  SpO2: 100% 100%    Last Pain:  Vitals:   06/06/20 1753  TempSrc:   PainSc: 3                  Catalina Gravel

## 2020-06-06 NOTE — Progress Notes (Signed)
Foley catheter removed per order. Pt tolerated well. Peri-care completed.

## 2020-06-06 NOTE — Anesthesia Preprocedure Evaluation (Addendum)
Anesthesia Evaluation  Patient identified by MRN, date of birth, ID band Patient awake    Reviewed: Allergy & Precautions, NPO status , Patient's Chart, lab work & pertinent test results  History of Anesthesia Complications Negative for: history of anesthetic complications  Airway Mallampati: II  TM Distance: >3 FB Neck ROM: Full    Dental  (+) Dental Advisory Given, Upper Dentures   Pulmonary former smoker,    Pulmonary exam normal breath sounds clear to auscultation       Cardiovascular negative cardio ROS Normal cardiovascular exam Rhythm:Regular Rate:Normal     Neuro/Psych negative neurological ROS  negative psych ROS   GI/Hepatic Neg liver ROS, PUD, GERD  ,Crohn's colitis, with abscess   Endo/Other  diabetes, Type 2  Renal/GU Renal InsufficiencyRenal diseaseRIGHT RENAL MASS     Musculoskeletal  (+) Arthritis ,   Abdominal   Peds  Hematology negative hematology ROS (+)   Anesthesia Other Findings Day of surgery medications reviewed with the patient.  Reproductive/Obstetrics                            Anesthesia Physical Anesthesia Plan  ASA: III  Anesthesia Plan: General   Post-op Pain Management:    Induction: Intravenous  PONV Risk Score and Plan: 3 and Dexamethasone and Ondansetron  Airway Management Planned: Oral ETT  Additional Equipment:   Intra-op Plan:   Post-operative Plan: Extubation in OR  Informed Consent: I have reviewed the patients History and Physical, chart, labs and discussed the procedure including the risks, benefits and alternatives for the proposed anesthesia with the patient or authorized representative who has indicated his/her understanding and acceptance.     Dental advisory given  Plan Discussed with: CRNA  Anesthesia Plan Comments:         Anesthesia Quick Evaluation

## 2020-06-06 NOTE — Plan of Care (Signed)
  Problem: Clinical Measurements: Goal: Postoperative complications will be avoided or minimized Outcome: Progressing   Problem: Education: Goal: Knowledge of General Education information will improve Description: Including pain rating scale, medication(s)/side effects and non-pharmacologic comfort measures Outcome: Progressing   Problem: Health Behavior/Discharge Planning: Goal: Ability to manage health-related needs will improve Outcome: Progressing   Problem: Clinical Measurements: Goal: Ability to maintain clinical measurements within normal limits will improve Outcome: Progressing Goal: Will remain free from infection Outcome: Progressing Goal: Diagnostic test results will improve Outcome: Progressing Goal: Respiratory complications will improve Outcome: Progressing Goal: Cardiovascular complication will be avoided Outcome: Progressing   Problem: Activity: Goal: Risk for activity intolerance will decrease Outcome: Progressing   Problem: Nutrition: Goal: Adequate nutrition will be maintained Outcome: Progressing   Problem: Elimination: Goal: Will not experience complications related to bowel motility Outcome: Progressing Goal: Will not experience complications related to urinary retention Outcome: Progressing   Problem: Pain Managment: Goal: General experience of comfort will improve Outcome: Progressing   Problem: Safety: Goal: Ability to remain free from injury will improve Outcome: Progressing

## 2020-06-06 NOTE — Anesthesia Procedure Notes (Signed)
Procedure Name: Intubation Date/Time: 06/06/2020 12:56 PM Performed by: Catalina Gravel, MD Pre-anesthesia Checklist: Patient identified, Emergency Drugs available, Suction available and Patient being monitored Patient Re-evaluated:Patient Re-evaluated prior to induction Oxygen Delivery Method: Circle system utilized Preoxygenation: Pre-oxygenation with 100% oxygen Induction Type: IV induction Ventilation: Mask ventilation without difficulty Laryngoscope Size: Mac and 4 Grade View: Grade I Tube type: Oral Tube size: 7.5 mm Number of attempts: 1 Airway Equipment and Method: Stylet and Oral airway Placement Confirmation: ETT inserted through vocal cords under direct vision,  positive ETCO2 and breath sounds checked- equal and bilateral Secured at: 22 cm Tube secured with: Tape Dental Injury: Teeth and Oropharynx as per pre-operative assessment

## 2020-06-06 NOTE — H&P (Signed)
Referring Physician(s): Sninsky,B  Supervising Physician: Mir, Sharen Heck  Patient Status:  [facility] OP TBA  Chief Complaint: Right renal mass   Subjective: Patient familiar to IR service from right lower quadrant abdominal abscess drain placement in 2020 and recent tele consultation with Dr. Vernard Gambles on 04/24/2020 to discuss treatment options for right renal mass concerning for renal cell carcinoma.  He is a 75 year old male with history of irritable bowel syndrome who presented in December 2020 with abdominal pain and CT imaging findings of inflammatory ileitis with incidental note made of a 2 cm right renal lesion.  Patient eventually required placement of a percutaneous drain and in July 2021 underwent bowel resection with ostomy which was subsequently taken down.  Most recent CT scan from February of this year reveals interval enlargement of the right lower pole renal mass now measuring up to 2.6 cm.  There was no renal vein involvement and no regional adenopathy.  Following discussions with Dr. Vernard Gambles he was deemed an appropriate candidate for CT-guided biopsy and cryoablation of the right renal mass and presents today for the procedure.  He currently denies fever, headache, chest pain, dyspnea, cough, abdominal pain, nausea, vomiting or bleeding.  He does have some chronic back pain.  Additional medical history as below.  Past Medical History:  Diagnosis Date  . Abdominal pain 02/15/2019  . Allergy    Seasonal  . Anemia   . Arthritis   . Basal cell carcinoma    Removed 1980's, renal  . Cataracts, both eyes   . Chronic kidney disease   . Crohn's colitis, with abscess ([place]) 08/2019  . Deaf, left   . Diarrhea 02/21/2019  . Diverticulitis   . GERD (gastroesophageal reflux disease)   . Hypokalemia 02/21/2019  . IBS (irritable bowel syndrome)   . Peptic ulcer 75 years old  . Scarlet fever 75 years old  . Shortness of breath dyspnea 1992   with exertion due to chemical at work  . Wears  dentures    full upper  . Wears hearing aid in right ear    Past Surgical History:  Procedure Laterality Date  . CATARACT EXTRACTION W/ INTRAOCULAR LENS  IMPLANT, BILATERAL    . CHOLECYSTECTOMY N/A 03/30/2015   Procedure: LAPAROSCOPIC CHOLECYSTECTOMY;  Surgeon: Hubbard Robinson, MD;  Location: [facility] ORS;  Service: General;  Laterality: N/A;  . COLON SURGERY  09/13/2019   .ileostomy. Pt reports complications  . COLONOSCOPY WITH PROPOFOL N/A 04/15/2019   Procedure: COLONOSCOPY WITH BIOPSY AND DILATION;  Surgeon: [name], MD;  Location: [place];  Service: Endoscopy;  Laterality: N/A;  Priority 3  . COLONOSCOPY WITH PROPOFOL N/A 03/20/2020   Procedure: COLONOSCOPY WITH PROPOFOL;  Surgeon: Lin Landsman, MD;  Location: [facility] ENDOSCOPY;  Service: Gastroenterology;  Laterality: N/A;  . EYE SURGERY    . HERNIA REPAIR Bilateral 75 years old   Inguinal Hernia  . IR RADIOLOGIST EVAL & MGMT  04/24/2020  . lasik    . reanastomosis of ileostomy  2021      Allergies: Penicillins  Medications: Prior to Admission medications   Medication Sig Start Date End Date Taking? Authorizing Provider  acetaminophen (TYLENOL) 500 MG tablet Take 500 mg by mouth every 8 (eight) hours as needed for moderate pain.   Yes [provider]  cetirizine (ZYRTEC) 10 MG tablet Take 10 mg by mouth daily.   Yes [provider]  colestipol (COLESTID) 1 g tablet Take 1 g by mouth 2 (two) times  daily.   Yes [provider]  ibuprofen (ADVIL) 200 MG tablet Take 200 mg by mouth every 8 (eight) hours as needed for moderate pain.    [provider]     Vital Signs: BP (!) 177/94   Pulse 66   Temp 97.9 F (36.6 C) (Oral)   Resp 16   SpO2 99%   Physical Exam awake, alert.  Chest clear to auscultation bilaterally.  Heart with regular rate and rhythm.  Abdomen soft, positive bowel sounds, currently nontender.  No significant lower extremity edema.  Imaging: DG Chest 1  View  Result Date: 06/05/2020 CLINICAL DATA:  75 year old male with preoperative chest x-ray EXAM: CHEST  1 VIEW COMPARISON:  05/01/2009 FINDINGS: Cardiomediastinal silhouette unchanged in size and contour. No central vascular congestion. No interlobular septal thickening. No pneumothorax or pleural effusion. No confluent airspace disease. No displaced fracture IMPRESSION: Negative for acute cardiopulmonary disease Electronically Signed   By: Corrie Mckusick D.O.   On: 06/05/2020 08:50    Labs:  CBC: Recent Labs    06/29/19 1457 10/21/19 1117 03/07/20 1516 06/04/20 1328  WBC 8.2 7.9 6.1 6.5  HGB 13.6 13.5 11.8* 13.0  HCT 41.0 41.8 35.7* 39.8  PLT 268 282 255 228    COAGS: Recent Labs    06/04/20 1328  INR 1.0    BMP: Recent Labs    03/07/20 1516 06/04/20 1328  NA 142 141  K 3.6 4.0  CL 105 106  CO2 24 29  GLUCOSE 94 92  BUN 18 17  CALCIUM 8.7 8.6*  CREATININE 1.07 1.13  GFRNONAA 67 >60  GFRAA 78  --     LIVER FUNCTION TESTS: Recent Labs    03/07/20 1516  BILITOT 0.3  AST 11  ALT 6  ALKPHOS 62  PROT 6.1  ALBUMIN 4.1    Assessment and Plan: Patient familiar to IR service from right lower quadrant abdominal abscess drain placement in 2020 and recent tele consultation with Dr. Vernard Gambles on 04/24/2020 to discuss treatment options for right renal mass concerning for renal cell carcinoma.  He is a 75 year old male with history of GERD, diverticulitis, arthritis, and irritable bowel syndrome/Crohn's colitis who presented in December 2020 with abdominal pain and CT imaging findings of inflammatory ileitis with incidental note made of a 2 cm right renal lesion.  Patient eventually required placement of a percutaneous drain and in July 2021 underwent bowel resection with ostomy which was subsequently taken down.  Most recent CT scan from February of this year reveals interval enlargement of the right lower pole renal mass now measuring up to 2.6 cm.  There was no renal vein  involvement and no regional adenopathy.  Following discussions with Dr. Vernard Gambles he was deemed an appropriate candidate for CT-guided biopsy and cryoablation of the right renal mass and presents today for the procedure. Risks and benefits of procedure was discussed with the patient  including, but not limited to bleeding, infection, damage to adjacent structures or low yield requiring additional tests and anesthesia related complications.   All of the questions were answered and there is agreement to proceed.  Consent signed and in chart.     Electronically Signed: D. Rowe Kenrick, PA-C 06/06/2020, 11:29 AM   I spent a total of 30 minutes at the the patient's bedside AND on the patient's hospital floor or unit, greater than 50% of which was counseling/coordinating care for CT-guided right renal mass biopsy and cryoablation

## 2020-06-07 ENCOUNTER — Encounter (HOSPITAL_COMMUNITY): Payer: Self-pay | Admitting: Interventional Radiology

## 2020-06-07 DIAGNOSIS — N2889 Other specified disorders of kidney and ureter: Secondary | ICD-10-CM | POA: Diagnosis not present

## 2020-06-07 LAB — CBC
HCT: 35.5 % — ABNORMAL LOW (ref 39.0–52.0)
Hemoglobin: 11.5 g/dL — ABNORMAL LOW (ref 13.0–17.0)
MCH: 27.8 pg (ref 26.0–34.0)
MCHC: 32.4 g/dL (ref 30.0–36.0)
MCV: 85.7 fL (ref 80.0–100.0)
Platelets: 212 10*3/uL (ref 150–400)
RBC: 4.14 MIL/uL — ABNORMAL LOW (ref 4.22–5.81)
RDW: 15 % (ref 11.5–15.5)
WBC: 13.5 10*3/uL — ABNORMAL HIGH (ref 4.0–10.5)
nRBC: 0 % (ref 0.0–0.2)

## 2020-06-07 MED ORDER — SODIUM CHLORIDE 0.9 % IV SOLN: INTRAVENOUS | Status: DC

## 2020-06-07 NOTE — Discharge Summary (Signed)
Patient ID: Todd Briggs MRN: [id number] DOB/AGE: 77-Sep-1947 77 y.o.  Admit date: 06/06/2020 Discharge date: 06/07/2020  Supervising Physician: Markus Daft  Patient Status: [facility] - In-pt  Admission Diagnoses: Right renal mass  Discharge Diagnoses:  Active Problems:   Status post cryoablation   Discharged Condition: stable  Hospital Course:  Patient presented to [facility] 06/06/2020 for an image-guided cryoablation of right renal mass by Dr. Dwaine Gale. Procedure occurred without major complications and patient was transferred to floor in stable condition (VSS, right flank puncture sites stable) for overnight observation. Foley catheter removed late last evening. No major events occurred overnight.  This AM, patient awake and alert with no complaints. Wife at bedside. Right flank puncture sites stable, denies pain of this area. Patient with dark brown urine this AM. Dr. Dwaine Gale was notified- recommended hydration (IV fluids and push PO fluids as well) along with CBC (hgb 11.5- as expected given post-procedural losses). Patient's urine was monitored throughout the day and is currently clear light pink in nature. Plan to discharge home today and follow-up with Dr. Dwaine Gale for televisit 2-4 weeks after discharge. Patient/wife notified to call IR clinic if notice blood/clots in urine ([phone]). IR clinic to follow-up with patient tomorrow and Monday regarding urine output.   Consults: None  Significant Diagnostic Studies: DG Chest 1 View  Result Date: 06/05/2020 CLINICAL DATA:  75 year old male with preoperative chest x-ray EXAM: CHEST  1 VIEW COMPARISON:  05/01/2009 FINDINGS: Cardiomediastinal silhouette unchanged in size and contour. No central vascular congestion. No interlobular septal thickening. No pneumothorax or pleural effusion. No confluent airspace disease. No displaced fracture IMPRESSION: Negative for acute cardiopulmonary disease Electronically Signed   By: Corrie Mckusick D.O.   On:  06/05/2020 08:50   CT GUIDE TISSUE ABLATION  Result Date: 06/06/2020 INDICATION: 75 year old gentleman with enhancing and enlarging solid right renal mass presents today measure radiology for CT-guided cryoablation. EXAM: CT-guided cryoablation of solid enhancing right lower pole renal mass. COMPARISON:  CT abdomen and pelvis 04/05/2020 MEDICATIONS: None ANESTHESIA/SEDATION: General anesthesia performed and monitored by the anesthesia team COMPLICATIONS: None immediate. TECHNIQUE: Informed written consent was obtained from the patient after a thorough discussion of the procedural risks, benefits and alternatives. All questions were addressed. Maximal Sterile Barrier Technique was utilized including caps, mask, sterile gowns, sterile gloves, sterile drape, hand hygiene and skin antiseptic. A timeout was performed prior to the initiation of the procedure. Patient positioned prone on the CT table. The right flank was prepped and draped in the usual sterile fashion. Contrast enhanced CT performed to better delineate the lesion. Following local lidocaine administration, 4 IceRod CX probes were then inserted in a rectangular configuration into the lesion utilizing CT guidance. 22 gauge needle was inserted between the right renal mass and ascending colon and hydrodissection was performed by administration of 100 mL dilute contrast. Cryoablation was then performed with 10 minutes of freezing followed by 5 minutes of thawing and finally 10 additional minutes of freezing. Non-contrast CT was performed at 8 minutes into the second freezing cycle to demonstrate maximum ice ball size. 5 minute thaw was performed followed by cauterization of all needles. Needles were sequentially removed with care not to avulse the ice ball. Post treatment contrast enhanced CT scan demonstrated post ablation changes in appropriate location. The patient tolerated the procedure well without immediate complication. Patient was transferred to  PACU for post anesthesia care. IMPRESSION: CT-guided cryoablation of solid enhancing, enlarging right renal mass as above. Electronically Signed   By: Sharen Heck  Mir M.D.   On: 06/06/2020 16:59    Treatments: Image-guided cryoablation of right renal mass  Discharge Exam: Blood pressure 124/71, pulse 76, temperature (!) 97.5 F (36.4 C), temperature source Oral, resp. rate 18, height 5' 10"  (1.778 m), weight 150 lb 2.1 oz (68.1 kg), SpO2 96 %. Physical Exam Vitals and nursing note reviewed.  Constitutional:      General: He is not in acute distress.    Appearance: Normal appearance.  Cardiovascular:     Rate and Rhythm: Normal rate and regular rhythm.     Heart sounds: Normal heart sounds. No murmur heard.   Pulmonary:     Effort: Pulmonary effort is normal. No respiratory distress.     Breath sounds: Normal breath sounds. No wheezing.  Skin:    General: Skin is warm and dry.     Comments: Right flank puncture sites soft without active bleeding or hematoma.  Neurological:     Mental Status: He is alert and oriented to person, place, and time.     Disposition: Discharge disposition: 01-Home or Self Care       Discharge Instructions    Call MD for:  difficulty breathing, headache or visual disturbances   Complete by: As directed    Call MD for:  extreme fatigue   Complete by: As directed    Call MD for:  hives   Complete by: As directed    Call MD for:  persistant dizziness or light-headedness   Complete by: As directed    Call MD for:  persistant nausea and vomiting   Complete by: As directed    Call MD for:  redness, tenderness, or signs of infection (pain, swelling, redness, odor or green/yellow discharge around incision site)   Complete by: As directed    Call MD for:  severe uncontrolled pain   Complete by: As directed    Call MD for:  temperature >100.4   Complete by: As directed    Diet - low sodium heart healthy   Complete by: As directed    Discharge  instructions   Complete by: As directed    PLEASE call IR clinic ([phone]) if you notice increased blood/clots in urine. Ok to shower 48 hours post-procedure. Recommend showering with bandage on, remove bandage immediately after showering and pat area dry. No further dressing changes needed after this- ensure area remains clean/dry until fully healed. No submerging (swimming, bathing) for 7 days post-procedure.   Increase activity slowly   Complete by: As directed    Lifting restrictions   Complete by: As directed    No lifting more than 10 pounds for 7 days post-procedure.   Remove dressing in 24 hours   Complete by: As directed    Following first shower (please see shower instructions for further details on this). No further dressing changes needed after this- please ensure area remains clean and dry until fully healed.     Allergies as of 06/07/2020      Reactions   Penicillins Rash   Occurred as a child Occurred as a child. Tolerates cefepime.       Medication List    TAKE these medications   acetaminophen 500 MG tablet Commonly known as: TYLENOL Take 500 mg by mouth every 8 (eight) hours as needed for moderate pain.   cetirizine 10 MG tablet Commonly known as: ZYRTEC Take 10 mg by mouth daily.   colestipol 1 g tablet Commonly known as: COLESTID Take 1 g by mouth 2 (  two) times daily.   ibuprofen 200 MG tablet Commonly known as: ADVIL Take 200 mg by mouth every 8 (eight) hours as needed for moderate pain.       Follow-up Information    Mir, Paula Libra, MD Follow up in 4 week(s).   Specialties: Interventional Radiology, Diagnostic Radiology, Radiology Why: Please follow-up with Dr. Dwaine Gale for televisit 2-4 weeks after discharge. Our office will call you to set up this appointment. Contact information: [place] [address] [phone]                Electronically Signed: Earley Abide, PA-C 06/07/2020, 3:33 PM   I have spent  Greater Than 30 Minutes discharging ADE STMARIE.

## 2020-06-07 NOTE — Care Management Obs Status (Signed)
[place] NOTIFICATION   Patient Details  Name: Todd Briggs MRN: [id number] Date of Birth: 05-25-1945   Medicare Observation Status Notification Given:  Yes    Lynnell Catalan, RN 06/07/2020, 2:19 PM

## 2020-06-07 NOTE — Discharge Instructions (Signed)
Cryoablation of Renal Tumors, Care After  This sheet gives you information about how to care for yourself after your procedure. Your health care provider may also give you more specific instructions. If you have problems or questions, contact your health care provider.  What can I expect after the procedure? After your procedure, it is common to have pain and discomfort in the right flank. You can take OTC Ibuprofen for this.  Follow these instructions at home: Puncture site care Follow instructions from your health care provider about how to take care of your incision. Make sure you:  - Wash your hands with soap and water before you change your bandage (dressing). If soap and water are not available, use hand sanitizer.  - Ok to shower 48 hours following procedure. Recommend showering with bandage on, remove bandage immediately after showering and pat area dry. No further dressing changes needed after this- ensure area remains clean and dry until fully healed.  - No submerging (swimming, bathing) for 7 days post-procedure. Check your puncture site area every day for signs of infection. Check for: - Redness, swelling, or pain. - Fluid or blood. - Warmth. - Pus or a bad smell. Activity Rest as often as needing during the first few days of recovery. No stooping, bending, or lifting more than 10 pounds for 1 week.  General instructions To prevent or treat constipation while you are taking prescription pain medicine, your health care provider may recommend that you: - Drink enough fluid to keep your urine clear or pale yellow. - Take over-the-counter or prescription medicines. - Eat foods that are high in fiber, such as fresh fruits and vegetables, whole grains, and beans. - Limit foods that are high in fat and processed sugars, such as fried and sweet foods. - Do not use any products that contain nicotine or tobacco, such as cigarettes and e-cigarettes. If you need help quitting, ask your  health care provider. - Keep all follow-up visits as told by your health care provider. This is important.  2-4 week televisit with the doctor who performed the procedure. Our office will call you to set up the appointment.   Contact a health care provider [phone]) if: You cannot pass gas. You are unable to have a bowel movement within 3 days. You have a skin rash. You notice increased blood/clots in urine  Get help right away if: You have a fever. You have severe or lasting pain in your abdomen, shoulder, or back. You have trouble swallowing or breathing. You have severe weakness or dizziness. You have chest pain or shortness of breath.   This information is not intended to replace advice given to you by your health care provider. Make sure you discuss any questions you have with your health care provider.

## 2020-06-07 NOTE — Care Management CC44 (Signed)
Condition Code 44 Documentation Completed  Patient Details  Name: [name] MRN: [id number] Date of Birth: [birth year]   Condition Code 44 given:  Yes Patient signature on Condition Code 44 notice:  Yes Documentation of 2 MD's agreement:  Yes Code 44 added to claim:  Yes    Lynnell Catalan, RN 06/07/2020, 2:19 PM

## 2020-06-07 NOTE — Progress Notes (Signed)
IR.  History of image-guided cryoablation of right renal mass 06/06/2020 by Dr. Dwaine Gale.  Patient evaluated bedside this afternoon. Awake and alert laying in bed with no complaints at this time. Wife at bedside. Hgb 11.5.  Heart RRR. Lungs CTA. Right flank puncture sites soft without active bleeding or hematoma. Approximately 400 cc clear light pink urine in urine collection unit in toilet.  Discussed case with Dr. Dwaine Gale who recommends additional urine check prior to possible discharge. Patient, wife, and RN updated on this- RN to page this PA once patient urinates for repeat urine check. Discussed discharge instructions with patient/wife- they are aware he might stay additional night depending on urine re-check. IR to follow.   Bea Graff Elissia Spiewak, PA-C 06/07/2020, 2:01 PM

## 2020-06-07 NOTE — Progress Notes (Signed)
Patient discharged to home with family, discharge instructions reviewed with patient who verbalized understanding. No new medications.

## 2020-06-07 NOTE — Progress Notes (Addendum)
Patient is s/p right renal mass cryoablation with Dr. Dwaine Gale on 06/06/20.   Patient was seen at the bedside. Patient had no complaints, no pain, puncture site clean, dry, no sign of bleeding.  Patient was asked to collect his urine as there were some clots seen in the urine this morning per RN.  The urine collection appeared dark brown.  Patient was not showing any signs or symptoms of acute bleeding (hypotension/ tachycardia/ dyspnea.)  Dr. Dwaine Gale notified, who recommenced hydration with IV and oral fluid, CBC to monitor Hgb, and reevaluate patient and the urine this afternoon.   IV fluid and CBC ordered, RN and patient notified about the plan.  PA to reevaluate patient this afternoon for possible discharge vs overnight observation.    Armando Gang Zubair Lofton PA-C 06/07/2020 10:44 AM

## 2020-06-12 ENCOUNTER — Other Ambulatory Visit: Payer: Self-pay

## 2020-06-12 ENCOUNTER — Encounter: Payer: Self-pay | Admitting: Gastroenterology

## 2020-06-12 ENCOUNTER — Other Ambulatory Visit: Payer: Self-pay | Admitting: Gastroenterology

## 2020-06-12 ENCOUNTER — Ambulatory Visit: Payer: Medicare HMO | Admitting: Gastroenterology

## 2020-06-12 VITALS — BP 190/83 | HR 77 | Temp 98.8°F | Ht 70.0 in | Wt 151.5 lb

## 2020-06-12 DIAGNOSIS — K50012 Crohn's disease of small intestine with intestinal obstruction: Secondary | ICD-10-CM | POA: Diagnosis not present

## 2020-06-12 DIAGNOSIS — Z9889 Other specified postprocedural states: Secondary | ICD-10-CM

## 2020-06-12 MED ORDER — COLESTIPOL HCL 1 G PO TABS: 1.0000 g | ORAL_TABLET | Freq: Two times a day (BID) | ORAL | 0 refills | Status: DC

## 2020-06-12 MED ORDER — BUDESONIDE 3 MG PO CPEP: 9.0000 mg | ORAL_CAPSULE | Freq: Every day | ORAL | 2 refills | Status: DC

## 2020-06-12 NOTE — Telephone Encounter (Signed)
Colestipol 1 g  [facility]  90 day

## 2020-06-12 NOTE — Telephone Encounter (Signed)
Last office visit today

## 2020-06-12 NOTE — Progress Notes (Signed)
Todd Darby, MD [address]  [place]  [place], [place] [address]  Main: [phone]  Fax: [phone]    Gastroenterology Consultation  Referring Provider:     Sofie Hartigan, MD Primary Care Physician:  Sofie Hartigan, MD Primary Gastroenterologist:  Dr. [name] Reason for Consultation:  small bowel Crohn's, postoperative follow-up        HPI:   Todd Briggs is a 75 y.o. male referred by Dr. Ellison Hughs Chrissie Noa, MD  for consultation & management of evaluate for Crohn's.  Patient was originally admitted on 02/15/2019 secondary to right lower abdominal pain associated with nonbloody loose bowel movements.  Patient has history of diabetes, peptic ulcer disease.  Patient underwent CT abdomen which revealed fairly extensive inflammatory ileitis with mucosal and serosal enhancement, submucosal edema, multiple ileal diverticulosis.  Patient was treated with antibiotics during that admission and was discharged home.  He was readmitted on 02/21/2019 due to ongoing symptoms, repeat CT revealed multiple air-fluid collections, intercommunicating, likely related to rupture of the small ileal diverticulum.  Patient underwent CT-guided drainage of the abscess, drain placement, antibiotics.  He subsequently underwent outpatient colonoscopy after healing of the abscess in 03/2019 which revealed terminal ileitis and normal colon.  Biopsies confirmed active patchy ileitis with no evidence of chronicity.  Random colon biopsies were unremarkable.  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  His weight has been stable.  He denies abdominal pain, nausea or vomiting, abdominal bloating.  He reports that he always has loose bowel movements, about 1-2 times daily.  He does have mild microcytic anemia, normal BMP.  No evidence of iron deficiency  Patient does not smoke or drink alcohol  Follow-up visit 07/20/2019 Patient reports  doing well without any symptoms at this time.  Patient underwent MR enterography on 07/13/2019 which revealed Stomach/Bowel: No evidence of bowel obstruction or fistula. Pseudo sacculation and mural stratification involving the distal ileum with low T2 signal and intermediate T2 signal in the wall. Similar to the prior study. Drain no longer present. Tract from previous drain is seen in the abdominal wall. No tract extending from bowel to the dominant wall. Areas of associated stricture are similar without significant upstream bowel obstruction or current evidence of penetrating disease  Most recent laboratory work revealed normal CRP, fecal calprotectin levels, ESR, vitamin D levels, QuantiFERON gold, TPMT, iron panel, B12 and folate panel as well as normal CBC  Follow-up visit 10/20/2019 Patient was evaluated by Dr. Victorio Palm, colorectal surgeon at [facility].  His case was discussed at multidisciplinary IBD conference.  Initially, decision was made to defer surgery.  However, patient developed severe right lower quadrant discomfort, associated with nausea and vomiting concerning for recurrence of abscess and impending perforation.  Imaging revealed recurrence of terminal ileitis therefore, he underwent laparoscopic ileocecectomy on 09/13/2019 at [facility] by Dr. Victorio Palm was discharged home on 09/20/2019.  Patient had postop follow-up 8/10, drain was removed.  He had issues with ostomy care and leakage which has been resolved at this time with proper closure of the seal.  He reports that he has 2 empty bag about 4-5 times a day, ostomy continence brown liquid stool.  He denies any bleeding from stoma site.  He denies abdominal pain.  He reports very good appetite and has been eating very well.  He says he cannot wait for the ostomy takedown and cannot carry a bag rest of his life.  He  is currently taking Imodium for loose stools  Of note, patient is found to have significant thrombocytosis in perioperative phase, most  recently 906 lakhs on 09/29/2019  He is also incidentally found to have right renal lesion associated right adrenal nodule, increasing size, currently being followed by Dr. Diamantina Providence, last seen by him on 10/11/2019.  Plan is to continue active surveillance given his recent abdominal surgery and challenging partial nephrectomy  Follow-up visit 03/07/2020 Patient underwent ileostomy takedown on 12/19/2019 and he recovered well from the surgery.  Patient reports that his main concern is severe nonbloody diarrhea 4-5 times daily despite taking Imodium.  He could not afford taking cholestyramine due to high co-pay.  He does report intermittent sharp abdominal pain only.  He denies nausea or vomiting postoperatively, patient has been gaining weight with good appetite.  He is able to work long hours.  His labs from October revealed normocytic anemia.  Patient otherwise does not have any concerns today  Follow-up visit 06/12/2020 Patient is here for routine follow-up.  He underwent image guided cryoablation of right renal mass on 06/07/2020.  With regards to his Crohn's disease, unfortunately, patient could not afford high co-pay for Humira.  We tried Entyvio, he is not able to afford $1100 infusion charge for interview as well.  Patient is currently taking colestipol only with 2 bowel movements per day, he denies any rectal bleeding.  He does report burning in right lower quadrant area.  He lost 2 to 3 pounds.  NSAIDs: None  Antiplts/Anticoagulants/Anti thrombotics: None  GI Procedures:  Colonoscopy 03/20/2020 - Patent end-to-side ileo-colonic anastomosis, characterized by healthy appearing mucosa and an intact staple line.  Could not traverse the neoterminal ileum due to postsurgical anatomy despite thorough look - The entire examined colon is normal. - The distal rectum and anal verge are normal on retroflexion view.  Colonoscopy 03/2019 Terminal ileitis, normal colon  DIAGNOSIS:  A. TERMINAL ILEUM; BIOPSY:   - PATCHY ACTIVE ILEITIS.  - SEE COMMENT.   Comment:  The findings are nonspecific. Diagnostic considerations would include  self-limited / infectious enteritis / colitis, medication effects,  inflammatory bowel disease, etc. Clinical correlation is recommended.  There is no evidence of dysplasia or malignancy.   B. COLON, RANDOM; BIOPSY:  - COLONIC MUCOSA WITH NO SIGNIFICANT PATHOLOGIC ALTERATION.  - NEGATIVE FOR MICROSCOPIC COLITIS, DYSPLASIA, AND MALIGNANCY.   Laparoscopic ileocecectomy 09/13/2019 Final Diagnosis    A: Terminal ileum and cecum, resection -Small intestine with chronic active ileitis, stricture, ulcerations, and diverticula (see comment) -Severe acute serositis with fibrinopurulent exudate -No granulomas identified -Appendix with fibrous obliteration  -Four lymph nodes, negative for malignancy (0/4) -Proximal margin appears histologically viable and is involved by mucosal ulceration -Distal margin appears histologically viable -No dysplasia or malignancy   The purpose of this addendum is to report the results of additional special stains.  An EBV ISH is negative.  A GMS stain is negative. A CMV stain was performed and is negative. There are no changes to the original diagnosis.  Past Medical History:  Diagnosis Date  . Abdominal pain 02/15/2019  . Allergy    Seasonal  . Anemia   . Arthritis   . Basal cell carcinoma    Removed 1980's, renal  . Cataracts, both eyes   . Chronic kidney disease   . Crohn's colitis, with abscess ([place]) 08/2019  . Deaf, left   . Diarrhea 02/21/2019  . Diverticulitis   . GERD (gastroesophageal reflux disease)   . Hypokalemia 02/21/2019  .  IBS (irritable bowel syndrome)   . Peptic ulcer 75 years old  . Scarlet fever 75 years old  . Shortness of breath dyspnea 1992   with exertion due to chemical at work  . Wears dentures    full upper  . Wears hearing aid in right ear     Past Surgical History:  Procedure Laterality  Date  . CATARACT EXTRACTION W/ INTRAOCULAR LENS  IMPLANT, BILATERAL    . CHOLECYSTECTOMY N/A 03/30/2015   Procedure: LAPAROSCOPIC CHOLECYSTECTOMY;  Surgeon: Hubbard Robinson, MD;  Location: [facility]S;  Service: General;  Laterality: N/A;  . COLON SURGERY  09/13/2019   .ileostomy. Pt reports complications  . COLONOSCOPY WITH PROPOFOL N/A 04/15/2019   Procedure: COLONOSCOPY WITH BIOPSY AND DILATION;  Surgeon: [name], MD;  Location: [facility];  Service: Endoscopy;  Laterality: N/A;  Priority 3  . COLONOSCOPY WITH PROPOFOL N/A 03/20/2020   Procedure: COLONOSCOPY WITH PROPOFOL;  Surgeon: Lin Landsman, MD;  Location: [facility]COPY;  Service: Gastroenterology;  Laterality: N/A;  . EYE SURGERY    . HERNIA REPAIR Bilateral 75 years old   Inguinal Hernia  . IR RADIOLOGIST EVAL & MGMT  04/24/2020  . lasik    . RADIOLOGY WITH ANESTHESIA Right 06/06/2020   Procedure: RADIOLOGY WITH ANESTHESIA  CRYOABLATION OF RIGHT RENAL MASS;  Surgeon: Arne Cleveland, MD;  Location: [facility];  Service: Radiology;  Laterality: Right;  . reanastomosis of ileostomy  2021    Current Outpatient Medications:  .  acetaminophen (TYLENOL) 500 MG tablet, Take 500 mg by mouth every 8 (eight) hours as needed for moderate pain., Disp: , Rfl:  .  budesonide (ENTOCORT EC) 3 MG 24 hr capsule, Take 3 capsules (9 mg total) by mouth daily., Disp: 90 capsule, Rfl: 2 .  cetirizine (ZYRTEC) 10 MG tablet, Take 10 mg by mouth daily., Disp: , Rfl:  .  ibuprofen (ADVIL) 200 MG tablet, Take 200 mg by mouth every 8 (eight) hours as needed for moderate pain., Disp: , Rfl:  .  colestipol (COLESTID) 1 g tablet, Take 1 tablet (1 g total) by mouth 2 (two) times daily., Disp: 180 tablet, Rfl: 0   Family History  Problem Relation Age of Onset  . Hypertension Mother   . Diabetes Mother   . Pancreatitis Mother   . Heart disease Mother   . Heart attack Father   . COPD Father   . Heart disease Father      Social History    Tobacco Use  . Smoking status: Former Smoker    Packs/day: 1.00    Types: Cigarettes    Quit date: 02/28/1985    Years since quitting: 35.3  . Smokeless tobacco: Never Used  Vaping Use  . Vaping Use: Never used  Substance Use Topics  . Alcohol use: Yes    Comment: rare  . Drug use: No    Allergies as of 06/12/2020 - Review Complete 06/12/2020  Allergen Reaction Noted  . Penicillins Rash 12/05/2013    Review of Systems:    All systems reviewed and negative except where noted in HPI.   Physical Exam:  BP (!) 190/83 (BP Location: Left Arm, Patient Position: Sitting, Cuff Size: Normal)   Pulse 77   Temp 98.8 F (37.1 C) (Oral)   Ht 5' 10"  (1.778 m)   Wt 151 lb 8 oz (68.7 kg)   BMI 21.74 kg/m  No LMP for male patient.  General:   Alert,  Well-developed, well-nourished, pleasant and  cooperative in NAD Head:  Normocephalic and atraumatic. Eyes:  Sclera clear, no icterus.   Conjunctiva pink. Ears:  Normal auditory acuity. Nose:  No deformity, discharge, or lesions. Mouth:  No deformity or lesions,oropharynx pink & moist. Neck:  Supple; no masses or thyromegaly. Lungs:  Respirations even and unlabored.  Clear throughout to auscultation.   No wheezes, crackles, or rhonchi. No acute distress. Heart:  Regular rate and rhythm; no murmurs, clicks, rubs, or gallops. Abdomen:  Normal bowel sounds. Soft, non-tender and non-distended without masses, hepatosplenomegaly or hernias noted.  No guarding or rebound tenderness.  Well-healed scar from recent surgery Rectal: Not performed Msk:  Symmetrical without gross deformities. Good, equal movement & strength bilaterally. Pulses:  Normal pulses noted. Extremities:  No clubbing or edema.  No cyanosis. Neurologic:  Alert and oriented x3;  grossly normal neurologically. Skin:  Intact without significant lesions or rashes. No jaundice. Psych:  Alert and cooperative. Normal mood and affect.  Imaging Studies: Reviewed  Assessment and  Plan:   LESEAN WOOLVERTON is a 75 y.o. male with history of acute inflammatory ileitis, ileal diverticulosis, probable diverticulitis with perforation, abscess s/p drainage, colonoscopy revealed active terminal ileitis with no evidence of chronicity on histology.   MR enterography revealed pseudo sacculation, mural stratification involving the distal ileum as well as associated stricture without significant upstream bowel obstruction or current evidence of penetrating disease. Patient developed recurrence of terminal ileitis in 7/21, underwent laparoscopic ileocecectomy on 09/13/2019, 35 cm of terminal ileum was removed with 5 cm of cecum, s/p temporary ileostomy followed by closure in 11/2019.  Pathology confirmed Crohn's disease  Crohn's disease of the terminal ileum, stricturing and penetrating phenotype s/p ileocecectomy and ileostomy on 09/13/2019, s/p closure in 11/2019 No obvious evidence of postop recurrence of Crohn's disease based on colonoscopy in 1/22.  However, I could not traverse into neoterminal ileum due to postsurgical anatomy Patient is currently on colestipol, continue the same Recommend to start budesonide 3 mg pills 48m daily Will check with insurance regarding bio similar coverage Patient is immune to hepatitis A, hepatitis B negative, HCV negative, Quant Farren gold negative   Follow up in 3 months   RCephas Darby MD

## 2020-07-03 ENCOUNTER — Other Ambulatory Visit: Payer: Self-pay

## 2020-07-03 ENCOUNTER — Ambulatory Visit
Admission: RE | Admit: 2020-07-03 | Discharge: 2020-07-03 | Disposition: A | Discharge status: Home / Self Care | Payer: Medicare HMO | Source: Ambulatory Visit | Attending: Student | Admitting: Student

## 2020-07-03 ENCOUNTER — Encounter: Payer: Self-pay | Admitting: *Deleted

## 2020-07-03 DIAGNOSIS — N2889 Other specified disorders of kidney and ureter: Secondary | ICD-10-CM

## 2020-07-03 HISTORY — PX: IR RADIOLOGIST EVAL & MGMT: IMG5224

## 2020-07-03 NOTE — Progress Notes (Signed)
Chief Complaint: Patient was consulted remotely today (TeleHealth) for No chief complaint on file.  at the request of Louk,Alexandra M.    Referring Physician(s): Louk,Alexandra M  History of Present Illness: Todd Briggs is a 75 y.o. male history of IBS was incidentally found to have an enlarging 2.3 cm enhancing solid right renal mass.  He underwent cryoablation of the right renal mass on 06/06/2020.    He is doing well today.  He complains of mild numbness in the right flank which wraps around to the front.  He denies abdominal pain, flank pain, chest pain, shortness or breath, and hematuria.     Past Medical History:  Diagnosis Date  . Abdominal pain 02/15/2019  . Allergy    Seasonal  . Anemia   . Arthritis   . Basal cell carcinoma    Removed 1980's, renal  . Cataracts, both eyes   . Chronic kidney disease   . Crohn's colitis, with abscess ([place]) 08/2019  . Deaf, left   . Diarrhea 02/21/2019  . Diverticulitis   . GERD (gastroesophageal reflux disease)   . Hypokalemia 02/21/2019  . IBS (irritable bowel syndrome)   . Peptic ulcer 75 years old  . Scarlet fever 75 years old  . Shortness of breath dyspnea 1992   with exertion due to chemical at work  . Wears dentures    full upper  . Wears hearing aid in right ear     Past Surgical History:  Procedure Laterality Date  . CATARACT EXTRACTION W/ INTRAOCULAR LENS  IMPLANT, BILATERAL    . CHOLECYSTECTOMY N/A 03/30/2015   Procedure: LAPAROSCOPIC CHOLECYSTECTOMY;  Surgeon: Hubbard Robinson, MD;  Location: [facility] ORS;  Service: General;  Laterality: N/A;  . COLON SURGERY  09/13/2019   .ileostomy. Pt reports complications  . COLONOSCOPY WITH PROPOFOL N/A 04/15/2019   Procedure: COLONOSCOPY WITH BIOPSY AND DILATION;  Surgeon: [name], MD;  Location: [facility];  Service: Endoscopy;  Laterality: N/A;  Priority 3  . COLONOSCOPY WITH PROPOFOL N/A 03/20/2020   Procedure: COLONOSCOPY WITH PROPOFOL;  Surgeon:  Lin Landsman, MD;  Location: [facility] ENDOSCOPY;  Service: Gastroenterology;  Laterality: N/A;  . EYE SURGERY    . HERNIA REPAIR Bilateral 75 years old   Inguinal Hernia  . IR RADIOLOGIST EVAL & MGMT  04/24/2020  . lasik    . RADIOLOGY WITH ANESTHESIA Right 06/06/2020   Procedure: RADIOLOGY WITH ANESTHESIA  CRYOABLATION OF RIGHT RENAL MASS;  Surgeon: Arne Cleveland, MD;  Location: [facility];  Service: Radiology;  Laterality: Right;  . reanastomosis of ileostomy  2021    Allergies: Penicillins  Medications: Prior to Admission medications   Medication Sig Start Date End Date Taking? Authorizing Provider  acetaminophen (TYLENOL) 500 MG tablet Take 500 mg by mouth every 8 (eight) hours as needed for moderate pain.    [provider]  budesonide (ENTOCORT EC) 3 MG 24 hr capsule Take 3 capsules (9 mg total) by mouth daily. 06/12/20 07/12/20  Lin Landsman, MD  cetirizine (ZYRTEC) 10 MG tablet Take 10 mg by mouth daily.    [provider]  colestipol (COLESTID) 1 g tablet Take 1 tablet (1 g total) by mouth 2 (two) times daily. 06/12/20 09/10/20  Lin Landsman, MD  ibuprofen (ADVIL) 200 MG tablet Take 200 mg by mouth every 8 (eight) hours as needed for moderate pain.    [provider]     Family History  Problem Relation Age of Onset  .  Hypertension Mother   . Diabetes Mother   . Pancreatitis Mother   . Heart disease Mother   . Heart attack Father   . COPD Father   . Heart disease Father     Social History   Socioeconomic History  . Marital status: Married    Spouse name: Not on file  . Number of children: Not on file  . Years of education: Not on file  . Highest education level: Not on file  Occupational History  . Not on file  Tobacco Use  . Smoking status: Former Smoker    Packs/day: 1.00    Types: Cigarettes    Quit date: 02/28/1985    Years since quitting: 35.3  . Smokeless tobacco: Never Used  Vaping Use  . Vaping Use: Never used   Substance and Sexual Activity  . Alcohol use: Yes    Comment: rare  . Drug use: No  . Sexual activity: Yes    Birth control/protection: None  Other Topics Concern  . Not on file  Social History Narrative  . Not on file   Social Determinants of Health   Financial Resource Strain: Not on file  Food Insecurity: Not on file  Transportation Needs: Not on file  Physical Activity: Not on file  Stress: Not on file  Social Connections: Not on file    ECOG Status: Review of Systems  Review of Systems: A 12 point ROS discussed and pertinent positives are indicated in the HPI above.  All other systems are negative.  Physical Exam No direct physical exam was performed   Vital Signs: There were no vitals taken for this visit.  Imaging: DG Chest 1 View  Result Date: 06/05/2020 CLINICAL DATA:  75 year old male with preoperative chest x-ray EXAM: CHEST  1 VIEW COMPARISON:  05/01/2009 FINDINGS: Cardiomediastinal silhouette unchanged in size and contour. No central vascular congestion. No interlobular septal thickening. No pneumothorax or pleural effusion. No confluent airspace disease. No displaced fracture IMPRESSION: Negative for acute cardiopulmonary disease Electronically Signed   By: Corrie Mckusick D.O.   On: 06/05/2020 08:50   CT GUIDE TISSUE ABLATION  Result Date: 06/06/2020 INDICATION: 75 year old gentleman with enhancing and enlarging solid right renal mass presents today measure radiology for CT-guided cryoablation. EXAM: CT-guided cryoablation of solid enhancing right lower pole renal mass. COMPARISON:  CT abdomen and pelvis 04/05/2020 MEDICATIONS: None ANESTHESIA/SEDATION: General anesthesia performed and monitored by the anesthesia team COMPLICATIONS: None immediate. TECHNIQUE: Informed written consent was obtained from the patient after a thorough discussion of the procedural risks, benefits and alternatives. All questions were addressed. Maximal Sterile Barrier Technique was  utilized including caps, mask, sterile gowns, sterile gloves, sterile drape, hand hygiene and skin antiseptic. A timeout was performed prior to the initiation of the procedure. Patient positioned prone on the CT table. The right flank was prepped and draped in the usual sterile fashion. Contrast enhanced CT performed to better delineate the lesion. Following local lidocaine administration, 4 IceRod CX probes were then inserted in a rectangular configuration into the lesion utilizing CT guidance. 22 gauge needle was inserted between the right renal mass and ascending colon and hydrodissection was performed by administration of 100 mL dilute contrast. Cryoablation was then performed with 10 minutes of freezing followed by 5 minutes of thawing and finally 10 additional minutes of freezing. Non-contrast CT was performed at 8 minutes into the second freezing cycle to demonstrate maximum ice ball size. 5 minute thaw was performed followed by cauterization of all needles. Needles  were sequentially removed with care not to avulse the ice ball. Post treatment contrast enhanced CT scan demonstrated post ablation changes in appropriate location. The patient tolerated the procedure well without immediate complication. Patient was transferred to PACU for post anesthesia care. IMPRESSION: CT-guided cryoablation of solid enhancing, enlarging right renal mass as above. Electronically Signed   By: Miachel Roux M.D.   On: 06/06/2020 16:59    Labs:  CBC: Recent Labs    10/21/19 1117 03/07/20 1516 06/04/20 1328 06/07/20 1111  WBC 7.9 6.1 6.5 13.5*  HGB 13.5 11.8* 13.0 11.5*  HCT 41.8 35.7* 39.8 35.5*  PLT 282 255 228 212    COAGS: Recent Labs    06/04/20 1328  INR 1.0    BMP: Recent Labs    03/07/20 1516 06/04/20 1328  NA 142 141  K 3.6 4.0  CL 105 106  CO2 24 29  GLUCOSE 94 92  BUN 18 17  CALCIUM 8.7 8.6*  CREATININE 1.07 1.13  GFRNONAA 67 >60  GFRAA 78  --     LIVER FUNCTION TESTS: Recent  Labs    03/07/20 1516  BILITOT 0.3  AST 11  ALT 6  ALKPHOS 62  PROT 6.1  ALBUMIN 4.1    TUMOR MARKERS: No results for input(s): AFPTM, CEA, CA199, CHROMGRNA in the last 8760 hours.  Assessment and Plan:  75 year old gentleman s/p cryoablation of solid enhancing right renal mass on 06/06/2020 returns to IR clinic for follow up.  He is doing well.  He had hematuria after the procedure for about 2 weeks but now has cleared and his urine is back to normal color.  His sensation of numbness in the right flank is likely due to ablation effects on intercostal nerve.  I am hopefully that this will continue to improve with time.  We will plan on performing a contrast enhanced MRI of the abdomen in mid July, 2022.  This will be his three month follow up.   Thank you for this interesting consult.  I greatly enjoyed meeting Todd Briggs and look forward to participating in their care.  A copy of this report was sent to the requesting provider on this date.  Electronically Signed: Paula Libra Afton Mikelson 07/03/2020, 11:19 AM   I spent a total of    15 Minutes in remote  clinical consultation, greater than 50% of which was counseling/coordinating care for right renal mass cryoablation..    Visit type: Audio only (telephone). Audio (no video) only due to patient's lack of internet/smartphone capability. Alternative for in-person consultation at [facility] [place] [address] [place], [place], [place]. This visit type was conducted due to national recommendations for restrictions regarding the COVID-19 Pandemic (e.g. social distancing).  This format is felt to be most appropriate for this patient at this time.  All issues noted in this document were discussed and addressed.

## 2020-08-16 ENCOUNTER — Other Ambulatory Visit: Payer: Self-pay | Admitting: Gastroenterology

## 2020-08-23 ENCOUNTER — Other Ambulatory Visit: Payer: Self-pay | Admitting: Interventional Radiology

## 2020-08-23 DIAGNOSIS — N2889 Other specified disorders of kidney and ureter: Secondary | ICD-10-CM

## 2020-08-29 ENCOUNTER — Telehealth: Payer: Self-pay | Admitting: Urology

## 2020-08-29 NOTE — Telephone Encounter (Signed)
Patient's wife, Butch Penny, called with questions about patient's upcoming scheduled appointments.  She is wanting to know if it is necessary for the patient to have all of the following appts:  7/15 - MRI abdomen (ordered by IR - Dr. Vernard Gambles)  8/9 - renal US (ordered by Dr. Diamantina Providence)  8/16 - chest xray (ordred by Dr. Diamantina Providence) on day of follow up appt scheduled.  Please advise.

## 2020-08-31 NOTE — Telephone Encounter (Signed)
Spoke to patient's wife, Butch Penny, and advised that Dr. Diamantina Providence said that patient does not need the renal ultrasound or the chest xray if he is scheduled for the MRI.    I contacted scheduling and cancelled the renal ultrasound.   Patient will follow up with Dr. Diamantina Providence on 8/16 in [place].

## 2020-09-07 ENCOUNTER — Ambulatory Visit
Admission: RE | Admit: 2020-09-07 | Discharge: 2020-09-07 | Disposition: A | Discharge status: Home / Self Care | Payer: Medicare HMO | Source: Ambulatory Visit | Attending: Interventional Radiology | Admitting: Interventional Radiology

## 2020-09-07 ENCOUNTER — Other Ambulatory Visit: Payer: Self-pay

## 2020-09-07 DIAGNOSIS — N2889 Other specified disorders of kidney and ureter: Secondary | ICD-10-CM | POA: Insufficient documentation

## 2020-09-07 MED ORDER — GADOBUTROL 1 MMOL/ML IV SOLN
6.0000 mL | Freq: Once | INTRAVENOUS | Status: AC | PRN
  Administered 2020-09-07: 6 mL via INTRAVENOUS

## 2020-09-09 ENCOUNTER — Other Ambulatory Visit: Payer: Self-pay | Admitting: Gastroenterology

## 2020-09-12 ENCOUNTER — Other Ambulatory Visit: Payer: Self-pay | Admitting: Gastroenterology

## 2020-09-12 DIAGNOSIS — K9089 Other intestinal malabsorption: Secondary | ICD-10-CM

## 2020-09-12 DIAGNOSIS — K50012 Crohn's disease of small intestine with intestinal obstruction: Secondary | ICD-10-CM

## 2020-10-02 ENCOUNTER — Other Ambulatory Visit: Payer: Medicare HMO

## 2020-10-02 ENCOUNTER — Ambulatory Visit: Payer: Medicare HMO | Admitting: Gastroenterology

## 2020-10-03 ENCOUNTER — Other Ambulatory Visit: Payer: Self-pay

## 2020-10-03 ENCOUNTER — Encounter: Payer: Self-pay | Admitting: *Deleted

## 2020-10-03 ENCOUNTER — Ambulatory Visit
Admission: RE | Admit: 2020-10-03 | Discharge: 2020-10-03 | Disposition: A | Discharge status: Home / Self Care | Payer: Medicare HMO | Source: Ambulatory Visit | Attending: Interventional Radiology | Admitting: Interventional Radiology

## 2020-10-03 DIAGNOSIS — N2889 Other specified disorders of kidney and ureter: Secondary | ICD-10-CM

## 2020-10-03 HISTORY — PX: IR RADIOLOGIST EVAL & MGMT: IMG5224

## 2020-10-03 NOTE — Progress Notes (Addendum)
Patient ID: Todd Briggs, male   DOB: 12-12-1945, 75 y.o.   MRN: [id number]       Chief Complaint: Patient was consulted remotely today (TeleHealth) for No chief complaint on file.  at the request of Yailin Biederman.    Referring Physician(s): Sninsky,Brian C  History of Present Illness: Todd Briggs is a 75 y.o. male with history of irritable bowel syndrome, presented 02/15/2019 with abdominal pain, CT demonstrated inflammatory ileitis without abscess, and incidental note made of a 2 cm right renal lesion. 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 His presenting symptoms of progressed with perforation requiring percutaneous drain placement, and July 2021 bowel resection with ostomy, subsequently taken down. 04/05/2020 CT shows interval enlargement of the right lower pole renal mass now 2.5 x 2.6 cm.  No renal vein involvement or regional adenopathy. 06/06/2020 underwent technically successful CT-guided cryoablation of the right renal lesion.  No biopsy performed.  He did well post procedure.  Transient hematuria attributed to Foley catheter. 07/03/2020 on follow-up patient described some right flank numbness post procedure, otherwise doing well.  The patient continues   to do well.  Still has some right posterior flank numbness.  No pain or hematuria.   His first post procedure MRI 09/08/2020  Past Medical History:  Diagnosis Date   Abdominal pain 02/15/2019   Allergy    Seasonal   Anemia    Arthritis    Basal cell carcinoma    Removed 1980's, renal   Cataracts, both eyes    Chronic kidney disease    Crohn's colitis, with abscess ([place]) 08/2019   Deaf, left    Diarrhea 02/21/2019   Diverticulitis    GERD (gastroesophageal reflux disease)    Hypokalemia 02/21/2019   IBS (irritable bowel syndrome)    Peptic ulcer 75 years old   Scarlet fever 75 years old   Shortness of breath dyspnea 1992    with exertion due to chemical at work   Wears dentures    full upper   Wears hearing aid in right ear     Past Surgical History:  Procedure Laterality Date   CATARACT EXTRACTION W/ INTRAOCULAR LENS  IMPLANT, BILATERAL     CHOLECYSTECTOMY N/A 03/30/2015   Procedure: LAPAROSCOPIC CHOLECYSTECTOMY;  Surgeon: Hubbard Robinson, MD;  Location: [facility];  Service: General;  Laterality: N/A;   COLON SURGERY  09/13/2019   .ileostomy. Pt reports complications   COLONOSCOPY WITH PROPOFOL N/A 04/15/2019   Procedure: COLONOSCOPY WITH BIOPSY AND DILATION;  Surgeon: [name], MD;  Location: [place];  Service: Endoscopy;  Laterality: N/A;  Priority 3   COLONOSCOPY WITH PROPOFOL N/A 03/20/2020   Procedure: COLONOSCOPY WITH PROPOFOL;  Surgeon: Lin Landsman, MD;  Location: [facility] ENDOSCOPY;  Service: Gastroenterology;  Laterality: N/A;   EYE SURGERY     HERNIA REPAIR Bilateral 75 years old   Inguinal Hernia   IR RADIOLOGIST EVAL & MGMT  04/24/2020   IR RADIOLOGIST EVAL & MGMT  07/03/2020   lasik     RADIOLOGY WITH ANESTHESIA Right 06/06/2020   Procedure: RADIOLOGY WITH ANESTHESIA  CRYOABLATION OF RIGHT RENAL MASS;  Surgeon: Arne Cleveland, MD;  Location: [facility];  Service: Radiology;  Laterality: Right;   reanastomosis of ileostomy  2021    Allergies: Penicillins  Medications: Prior to Admission medications   Medication Sig Start Date End Date Taking? Authorizing Provider  acetaminophen (TYLENOL) 500 MG tablet Take 500 mg  by mouth every 8 (eight) hours as needed for moderate pain.    [provider]  cetirizine (ZYRTEC) 10 MG tablet Take 10 mg by mouth daily.    [provider]  colestipol (COLESTID) 1 g tablet TAKE 2 TABLETS BY MOUTH 2 TIMES DAILY. 09/12/20   Lin Landsman, MD  ibuprofen (ADVIL) 200 MG tablet Take 200 mg by mouth every 8 (eight) hours as needed for moderate pain.    [provider]     Family History  Problem Relation Age of Onset    Hypertension Mother    Diabetes Mother    Pancreatitis Mother    Heart disease Mother    Heart attack Father    COPD Father    Heart disease Father     Social History   Socioeconomic History   Marital status: Married    Spouse name: Not on file   Number of children: Not on file   Years of education: Not on file   Highest education level: Not on file  Occupational History   Not on file  Tobacco Use   Smoking status: Former    Packs/day: 1.00    Types: Cigarettes    Quit date: 02/28/1985    Years since quitting: 35.6   Smokeless tobacco: Never  Vaping Use   Vaping Use: Never used  Substance and Sexual Activity   Alcohol use: Yes    Comment: rare   Drug use: No   Sexual activity: Yes    Birth control/protection: None  Other Topics Concern   Not on file  Social History Narrative   Not on file   Social Determinants of Health   Financial Resource Strain: Not on file  Food Insecurity: Not on file  Transportation Needs: Not on file  Physical Activity: Not on file  Stress: Not on file  Social Connections: Not on file    ECOG Status: 1 - Symptomatic but completely ambulatory  Review of Systems  Review of Systems: A 12 point ROS discussed and pertinent positives are indicated in the HPI above.  All other systems are negative.  Physical Exam No direct physical exam was performed (except for noted visual exam findings with Video Visits).     Vital Signs: There were no vitals taken for this visit.  Imaging: MR ABDOMEN WWO CONTRAST  Result Date: 09/09/2020 CLINICAL DATA:  Status post CT-guided cryoablation of lower right renal cell carcinoma 06/06/2020. Routine follow-up. Additional history of inflammatory bowel disease. EXAM: MRI ABDOMEN WITHOUT AND WITH CONTRAST TECHNIQUE: Multiplanar multisequence MR imaging of the abdomen was performed both before and after the administration of intravenous contrast. CONTRAST:  47m GADAVIST GADOBUTROL 1 MMOL/ML IV SOLN  COMPARISON:  04/05/2020 CT abdomen/pelvis. FINDINGS: Lower chest: No acute abnormality at the lung bases. Hepatobiliary: Normal liver size and configuration. No hepatic steatosis. A few scattered subcentimeter simple liver cysts throughout the liver. No suspicious liver masses. Cholecystectomy. No biliary ductal dilatation. Common bile duct diameter 5 mm. No choledocholithiasis. No biliary masses, strictures or beading. Pancreas: No pancreatic mass or duct dilation.  No pancreas divisum. Spleen: Normal size. No mass. Adrenals/Urinary Tract: Adrenals are stable and without discrete adrenal nodules. No hydronephrosis. Ablation defect in the posterior lower right kidney measures 3.5 x 3.0 cm (series 9/image 27) and demonstrates heterogeneous T1 hyperintensity with mixed T2 signal intensity and no convincing solid enhancement on the postcontrast subtraction sequences, compatible with coagulative necrosis. Otherwise normal kidneys with no new renal lesions. Stomach/Bowel: Normal non-distended stomach.  Stable postsurgical changes from ileocecal resection with ileocolic anastomosis in the right abdomen. No dilated or newly thick-walled bowel loops. Vascular/Lymphatic: Atherosclerotic nonaneurysmal abdominal aorta. Patent portal, splenic, hepatic and renal veins. 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 No pathologically enlarged lymph nodes in the abdomen. Other: No abdominal ascites or focal fluid collection. Musculoskeletal: No aggressive appearing focal osseous lesions. IMPRESSION: 1. Expected post ablation changes in the posterior lower right kidney. This study will serve as a new baseline for future follow-up. No evidence of enhancing residual or recurrent neoplasm at the ablation site. No renal vein tumor thrombus. 2. No metastatic disease in the abdomen. 3. Duplicated IVC. Electronically Signed   By: Ilona Sorrel M.D.   On: 09/09/2020 10:37    Labs:  CBC: Recent Labs    10/21/19 1117 03/07/20 1516  06/04/20 1328 06/07/20 1111  WBC 7.9 6.1 6.5 13.5*  HGB 13.5 11.8* 13.0 11.5*  HCT 41.8 35.7* 39.8 35.5*  PLT 282 255 228 212    COAGS: Recent Labs    06/04/20 1328  INR 1.0    BMP: Recent Labs    03/07/20 1516 06/04/20 1328  NA 142 141  K 3.6 4.0  CL 105 106  CO2 24 29  GLUCOSE 94 92  BUN 18 17  CALCIUM 8.7 8.6*  CREATININE 1.07 1.13  GFRNONAA 67 >60  GFRAA 78  --     LIVER FUNCTION TESTS: Recent Labs    03/07/20 1516  BILITOT 0.3  AST 11  ALT 6  ALKPHOS 62  PROT 6.1  ALBUMIN 4.1    TUMOR MARKERS: No results for input(s): AFPTM, CEA, CA199, CHROMGRNA in the last 8760 hours.  Assessment and Plan:  My impression is that the patient has done well post cryoablation of the right renal neoplasm. MRI shows expected ablation changes.  No evidence of residual recurrent or enhancing tumor.  No adenopathy or other suggestion of metastatic disease.  I reviewed the findings with the patient.  He seemed to understand and did ask appropriate questions which were answered.  I reiterated the need for continued imaging surveillance over a total of 5 years to confirm no recurrence of his tumor post ablation.  We will plan to get another scan in about 6 months.  See him back after that is done.  He knows to call in the interval with any questions or issues potentially related to the renal ablation.  Thank you for this interesting consult.  I greatly enjoyed meeting Todd Briggs and look forward to participating in their care.  A copy of this report was sent to the requesting provider on this date.  Electronically Signed: Rickard Rhymes 10/03/2020, 10:13 AM   I spent a total of    15 Minutes in remote  clinical consultation, greater than 50% of which was counseling/coordinating care for right renal neoplasm, post cryoablation.    Visit type: Audio only (telephone). Audio (no video) only due to patient's lack of internet/smartphone capability. Alternative for  in-person consultation at [facility], [address], [place], [place]. This visit type was conducted due to national recommendations for restrictions regarding the COVID-19 Pandemic (e.g. social distancing).  This format is felt to be most appropriate for this patient at this time.  All issues noted in this document were discussed and addressed.

## 2020-10-09 ENCOUNTER — Other Ambulatory Visit: Payer: Self-pay

## 2020-10-09 ENCOUNTER — Ambulatory Visit: Payer: 59 | Admitting: Urology

## 2020-10-11 ENCOUNTER — Ambulatory Visit: Payer: Medicare HMO | Admitting: Gastroenterology

## 2020-12-14 ENCOUNTER — Other Ambulatory Visit: Payer: Self-pay | Admitting: Gastroenterology

## 2020-12-14 DIAGNOSIS — K50012 Crohn's disease of small intestine with intestinal obstruction: Secondary | ICD-10-CM

## 2020-12-14 DIAGNOSIS — K9089 Other intestinal malabsorption: Secondary | ICD-10-CM

## 2020-12-17 ENCOUNTER — Other Ambulatory Visit: Payer: Self-pay

## 2020-12-17 DIAGNOSIS — K9089 Other intestinal malabsorption: Secondary | ICD-10-CM

## 2020-12-17 DIAGNOSIS — K50012 Crohn's disease of small intestine with intestinal obstruction: Secondary | ICD-10-CM

## 2020-12-17 MED ORDER — COLESTIPOL HCL 1 G PO TABS: 2.0000 g | ORAL_TABLET | Freq: Two times a day (BID) | ORAL | 0 refills | Status: DC

## 2020-12-17 NOTE — Progress Notes (Signed)
Refilled patients  colestid 1 g tablet last office visit was 06/12/2020

## 2020-12-25 ENCOUNTER — Other Ambulatory Visit: Payer: Self-pay

## 2020-12-25 ENCOUNTER — Other Ambulatory Visit: Payer: Self-pay | Admitting: Gastroenterology

## 2020-12-25 MED ORDER — BUDESONIDE 3 MG PO CPEP: 3.0000 mg | ORAL_CAPSULE | Freq: Every day | ORAL | 0 refills | Status: DC

## 2020-12-25 NOTE — Progress Notes (Signed)
Refilled budesonie as requested

## 2021-01-14 ENCOUNTER — Other Ambulatory Visit: Payer: Self-pay

## 2021-01-14 ENCOUNTER — Other Ambulatory Visit: Payer: Self-pay | Admitting: Gastroenterology

## 2021-01-14 DIAGNOSIS — K9089 Other intestinal malabsorption: Secondary | ICD-10-CM

## 2021-01-14 DIAGNOSIS — K50012 Crohn's disease of small intestine with intestinal obstruction: Secondary | ICD-10-CM

## 2021-01-14 MED ORDER — COLESTIPOL HCL 1 G PO TABS: 2.0000 g | ORAL_TABLET | Freq: Two times a day (BID) | ORAL | 0 refills | Status: DC

## 2021-01-14 NOTE — Progress Notes (Signed)
Refill medicine as requested

## 2021-01-23 ENCOUNTER — Other Ambulatory Visit: Payer: Self-pay

## 2021-01-23 ENCOUNTER — Other Ambulatory Visit: Payer: Self-pay | Admitting: Gastroenterology

## 2021-01-23 MED ORDER — BUDESONIDE 3 MG PO CPEP: ORAL_CAPSULE | ORAL | 2 refills | Status: DC

## 2021-01-23 NOTE — Progress Notes (Signed)
Sent in refill as requested

## 2021-02-13 ENCOUNTER — Other Ambulatory Visit: Payer: Self-pay | Admitting: Gastroenterology

## 2021-02-13 ENCOUNTER — Telehealth: Payer: Self-pay

## 2021-02-13 DIAGNOSIS — K50012 Crohn's disease of small intestine with intestinal obstruction: Secondary | ICD-10-CM

## 2021-02-13 DIAGNOSIS — K9089 Other intestinal malabsorption: Secondary | ICD-10-CM

## 2021-02-13 MED ORDER — COLESTIPOL HCL 1 G PO TABS: 2.0000 g | ORAL_TABLET | Freq: Two times a day (BID) | ORAL | 0 refills | Status: DC

## 2021-02-13 NOTE — Telephone Encounter (Signed)
Sent in refill as requested 

## 2021-03-19 ENCOUNTER — Other Ambulatory Visit: Payer: Self-pay | Admitting: Gastroenterology

## 2021-03-19 DIAGNOSIS — K9089 Other intestinal malabsorption: Secondary | ICD-10-CM

## 2021-03-19 DIAGNOSIS — K50012 Crohn's disease of small intestine with intestinal obstruction: Secondary | ICD-10-CM

## 2021-03-28 ENCOUNTER — Other Ambulatory Visit: Payer: Self-pay | Admitting: Interventional Radiology

## 2021-03-28 DIAGNOSIS — N2889 Other specified disorders of kidney and ureter: Secondary | ICD-10-CM

## 2021-04-12 ENCOUNTER — Ambulatory Visit
Admission: RE | Admit: 2021-04-12 | Discharge: 2021-04-12 | Disposition: A | Discharge status: Home / Self Care | Payer: Medicare HMO | Source: Ambulatory Visit | Attending: Interventional Radiology | Admitting: Interventional Radiology

## 2021-04-12 ENCOUNTER — Other Ambulatory Visit: Payer: Self-pay

## 2021-04-12 DIAGNOSIS — N2889 Other specified disorders of kidney and ureter: Secondary | ICD-10-CM | POA: Diagnosis not present

## 2021-04-12 MED ORDER — GADOBUTROL 1 MMOL/ML IV SOLN
7.0000 mL | Freq: Once | INTRAVENOUS | Status: AC | PRN
  Administered 2021-04-12: 7 mL via INTRAVENOUS

## 2021-04-16 ENCOUNTER — Encounter: Payer: Self-pay | Admitting: *Deleted

## 2021-04-16 ENCOUNTER — Other Ambulatory Visit: Payer: Self-pay

## 2021-04-16 ENCOUNTER — Ambulatory Visit
Admission: RE | Admit: 2021-04-16 | Discharge: 2021-04-16 | Disposition: A | Discharge status: Home / Self Care | Payer: Medicare HMO | Source: Ambulatory Visit | Attending: Interventional Radiology | Admitting: Interventional Radiology

## 2021-04-16 DIAGNOSIS — N2889 Other specified disorders of kidney and ureter: Secondary | ICD-10-CM

## 2021-04-16 HISTORY — PX: IR RADIOLOGIST EVAL & MGMT: IMG5224

## 2021-04-16 NOTE — Progress Notes (Signed)
Chief Complaint: Patient was consulted remotely today (TeleHealth) for follow up s/p right renal mass ablation  at the request of [facility].    Referring Physician(s): Sninsky,Brian C  History of Present Illness: Todd Briggs is a 76 y.o. male with history of irritable bowel syndrome, presented on 02/15/2019 with abdominal pain, CT demonstrated inflammatory ileitis without abscess, and incidental note made of a 2 cm right renal lesion.  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 His presenting symptoms of progressed with perforation requiring percutaneous drain placement, and July 2021 bowel resection with ostomy, subsequently taken down.  04/05/2020 CT shows interval enlargement of the right lower pole renal mass now 2.5 x 2.6 cm.  No renal vein involvement or regional adenopathy.  06/06/2020 underwent technically successful CT-guided cryoablation of the right renal lesion.  No biopsy performed.    07/03/2020 on follow-up patient described some right flank numbness post procedure, otherwise doing well.  10/03/2020 on follow-up he described persistent numbness in the right flank, but overall doing well.  He is doing well today.  He has mild intermittent bilateral flank pain and persistent right flank numbness.  He denies unintentional weight loss and hematuria.    Most recent MRI from 04/12/2021 show interval decrease in size of ablated mass in the inferior pole of the right kidney.  He has no new lesions.  Past Medical History:  Diagnosis Date   Abdominal pain 02/15/2019   Allergy    Seasonal   Anemia    Arthritis    Basal cell carcinoma    Removed 1980's, renal   Cataracts, both eyes    Chronic kidney disease    Crohn's colitis, with abscess ([place]) 08/2019   Deaf, left    Diarrhea 02/21/2019   Diverticulitis    GERD (gastroesophageal reflux disease)    Hypokalemia 02/21/2019   IBS  (irritable bowel syndrome)    Peptic ulcer 76 years old   Scarlet fever 76 years old   Shortness of breath dyspnea 1992   with exertion due to chemical at work   Wears dentures    full upper   Wears hearing aid in right ear     Past Surgical History:  Procedure Laterality Date   CATARACT EXTRACTION W/ INTRAOCULAR LENS  IMPLANT, BILATERAL     CHOLECYSTECTOMY N/A 03/30/2015   Procedure: LAPAROSCOPIC CHOLECYSTECTOMY;  Surgeon: Hubbard Robinson, MD;  Location: [facility];  Service: General;  Laterality: N/A;   COLON SURGERY  09/13/2019   .ileostomy. Pt reports complications   COLONOSCOPY WITH PROPOFOL N/A 04/15/2019   Procedure: COLONOSCOPY WITH BIOPSY AND DILATION;  Surgeon: [name], MD;  Location: [place];  Service: Endoscopy;  Laterality: N/A;  Priority 3   COLONOSCOPY WITH PROPOFOL N/A 03/20/2020   Procedure: COLONOSCOPY WITH PROPOFOL;  Surgeon: Lin Landsman, MD;  Location[facility] ENDOSCOPY;  Service: Gastroenterology;  Laterality: N/A;   EYE SURGERY     HERNIA REPAIR Bilateral 76 years old   Inguinal Hernia   IR RADIOLOGIST EVAL & MGMT  04/24/2020   IR RADIOLOGIST EVAL & MGMT  07/03/2020   IR RADIOLOGIST EVAL & MGMT  10/03/2020   lasik     RADIOLOGY WITH ANESTHESIA Right 06/06/2020   Procedure: RADIOLOGY WITH ANESTHESIA  CRYOABLATION OF RIGHT RENAL MASS;  Surgeon: Arne Cleveland, MD;  Location: [facility];  Service: Radiology;  Laterality: Right;   reanastomosis of ileostomy  2021  Allergies: Penicillins  Medications: Prior to Admission medications   Medication Sig Start Date End Date Taking? Authorizing Provider  acetaminophen (TYLENOL) 500 MG tablet Take 500 mg by mouth every 8 (eight) hours as needed for moderate pain.    [provider]  budesonide (ENTOCORT EC) 3 MG 24 hr capsule TAKE 1 CAPSULE BY MOUTH DAILY. 01/23/21   Lin Landsman, MD  budesonide (ENTOCORT EC) 3 MG 24 hr capsule TAKE 3 CAPSULES (9 MG TOTAL) BY MOUTH DAILY. 01/23/21    Lin Landsman, MD  cetirizine (ZYRTEC) 10 MG tablet Take 10 mg by mouth daily.    [provider]  colestipol (COLESTID) 1 g tablet TAKE 2 TABLETS BY MOUTH 2 TIMES DAILY. 02/13/21   Lin Landsman, MD  colestipol (COLESTID) 1 g tablet TAKE 2 TABLETS BY MOUTH 2 TIMES DAILY. 03/19/21   Lin Landsman, MD  ibuprofen (ADVIL) 200 MG tablet Take 200 mg by mouth every 8 (eight) hours as needed for moderate pain.    [provider]     Family History  Problem Relation Age of Onset   Hypertension Mother    Diabetes Mother    Pancreatitis Mother    Heart disease Mother    Heart attack Father    COPD Father    Heart disease Father     Social History   Socioeconomic History   Marital status: Married    Spouse name: Not on file   Number of children: Not on file   Years of education: Not on file   Highest education level: Not on file  Occupational History   Not on file  Tobacco Use   Smoking status: Former    Packs/day: 1.00    Types: Cigarettes    Quit date: 02/28/1985    Years since quitting: 36.1   Smokeless tobacco: Never  Vaping Use   Vaping Use: Never used  Substance and Sexual Activity   Alcohol use: Yes    Comment: rare   Drug use: No   Sexual activity: Yes    Birth control/protection: None  Other Topics Concern   Not on file  Social History Narrative   Not on file   Social Determinants of Health   Financial Resource Strain: Not on file  Food Insecurity: Not on file  Transportation Needs: Not on file  Physical Activity: Not on file  Stress: Not on file  Social Connections: Not on file    Review of Systems  Review of Systems: A 12 point ROS discussed and pertinent positives are indicated in the HPI above.  All other systems are negative.  Physical Exam No direct physical exam was performed   Vital Signs: There were no vitals taken for this visit.  Imaging: MR ABDOMEN WWO CONTRAST  Result Date: 04/12/2021 CLINICAL DATA:   History of right renal neoplasm status post ablation EXAM: MRI ABDOMEN WITHOUT AND WITH CONTRAST TECHNIQUE: Multiplanar multisequence MR imaging of the abdomen was performed both before and after the administration of intravenous contrast. CONTRAST:  69mL GADAVIST GADOBUTROL 1 MMOL/ML IV SOLN COMPARISON:  MRI abdomen 09/07/2020 FINDINGS: Lower chest: No acute findings. Hepatobiliary: Liver is normal in size and contour with no suspicious mass identified. A few scattered subcentimeter cysts appear unchanged. No evidence of hepatic steatosis. Gallbladder is surgically absent. No biliary ductal dilatation identified. Pancreas: No mass, inflammatory changes, or other parenchymal abnormality identified. Spleen:  Within normal limits in size and appearance. Adrenals/Urinary Tract: Stable small nodule of the right  adrenal gland measuring 8 mm. Interval decreased size of the heterogeneous exophytic mass at the posterior right kidney consistent with previous ablation. The mass measures 3.7 x 2.8 cm in axial dimensions with similar hypointense T2 signal rim and mixed fat and solid signal intensities. No new suspicious postcontrast enhancement identified within the mass region. No new suspicious mass identified in the kidneys. Stable subcentimeter cyst in the medial right kidney. No hydronephrosis. Stomach/Bowel: Visualized bowel is unremarkable. Vascular/Lymphatic: No pathologically enlarged lymph nodes identified. No abdominal aortic aneurysm demonstrated. Right renal vein is patent. Other:  No ascites. Musculoskeletal: No suspicious bone lesions identified. IMPRESSION: 1. Interval decreased size of the heterogeneous exophytic previously ablated mass at the posterior lower right kidney. No suspicious nodularity or enhancement identified within the mass. 2. No lymphadenopathy. 3. Other ancillary findings as described. Electronically Signed   By: Ofilia Neas M.D.   On: 04/12/2021 16:00    Labs:  CBC: Recent Labs     06/04/20 1328 06/07/20 1111  WBC 6.5 13.5*  HGB 13.0 11.5*  HCT 39.8 35.5*  PLT 228 212    COAGS: Recent Labs    06/04/20 1328  INR 1.0    BMP: Recent Labs    06/04/20 1328  NA 141  K 4.0  CL 106  CO2 29  GLUCOSE 92  BUN 17  CALCIUM 8.6*  CREATININE 1.13  GFRNONAA >60    LIVER FUNCTION TESTS: No results for input(s): BILITOT, AST, ALT, ALKPHOS, PROT, ALBUMIN in the last 8760 hours.  TUMOR MARKERS: No results for input(s): AFPTM, CEA, CA199, CHROMGRNA in the last 8760 hours.  Assessment and Plan: 76 year old gentleman 10 months s/p Cryoablation of right inferior pole neoplasm returns to IR clinic for follow up.  He is doing well.  Most recent MRI from 04/12/2021 shows no evidence of reoccurrence or new lesions.  I reviewed the findings with Mr. Moore.  He understood the information and asked appropriate questions.  I re-iterated the need for continued imaging surveillance over a total of 5 years to confirm no recurrence.  We will perform next MRI in October, 2023.  I will see him back in clinic after the imaging is completed.  He knows to call in the interval with any questions or issues potentially related to the ablation.  Plan: -Obtain MRI in 11/2021 -Schedule clinic visit in 11/2021   Thank you for this interesting consult.  I greatly enjoyed meeting SHIVAN HODES and look forward to participating in their care.  A copy of this report was sent to the requesting provider on this date.  Electronically Signed: Paula Libra Suha Schoenbeck 04/16/2021, 11:13 AM   I spent a total of    15 Minutes in remote  clinical consultation, greater than 50% of which was counseling/coordinating care for renal neoplasm ablation.    Visit type: Audio only (telephone). Audio (no video) only due to patient's lack of internet/smartphone capability. Alternative for in-person consultation at [facility], [place] [address], [place], [place]. This visit type was conducted due to  national recommendations for restrictions regarding the COVID-19 Pandemic (e.g. social distancing).  This format is felt to be most appropriate for this patient at this time.  All issues noted in this document were discussed and addressed.

## 2021-04-21 ENCOUNTER — Other Ambulatory Visit: Payer: Self-pay | Admitting: Gastroenterology

## 2021-05-02 ENCOUNTER — Other Ambulatory Visit: Payer: Self-pay | Admitting: Gastroenterology

## 2021-05-06 ENCOUNTER — Other Ambulatory Visit: Payer: Self-pay | Admitting: Gastroenterology

## 2021-05-06 DIAGNOSIS — K50012 Crohn's disease of small intestine with intestinal obstruction: Secondary | ICD-10-CM

## 2021-05-06 DIAGNOSIS — K9089 Other intestinal malabsorption: Secondary | ICD-10-CM

## 2021-05-07 ENCOUNTER — Telehealth: Payer: Self-pay

## 2021-05-07 DIAGNOSIS — K50012 Crohn's disease of small intestine with intestinal obstruction: Secondary | ICD-10-CM

## 2021-05-07 DIAGNOSIS — K9089 Other intestinal malabsorption: Secondary | ICD-10-CM

## 2021-05-07 MED ORDER — COLESTIPOL HCL 1 G PO TABS: 2.0000 g | ORAL_TABLET | Freq: Two times a day (BID) | ORAL | 1 refills | Status: DC

## 2021-05-07 MED ORDER — BUDESONIDE 3 MG PO CPEP: ORAL_CAPSULE | ORAL | 1 refills | Status: DC

## 2021-05-07 NOTE — Telephone Encounter (Signed)
Patient stopped by office and states he needs his budesonide and Colestipol refilled. Informed patient that he is due for a follow up appointment and could refill it till his appointment after he makes one. He made one on 06/18/2021 refilled medication. He states he is taking medication 3 pills daily  ?

## 2021-05-17 IMAGING — CT CT ABD-PELV W/ CM
2 of 5 series · 14 of 46 positions shown, 16 images · IV contrast (APPLIED)
Comparison: 02/15/2019

CLINICAL DATA: Abdominal pain and distension

EXAM:
CT ABDOMEN AND PELVIS WITH CONTRAST
TECHNIQUE: Multidetector CT imaging of the abdomen and pelvis was performed
using the standard protocol following bolus administration of
intravenous contrast.
CONTRAST:  100mL OMNIPAQUE IOHEXOL 300 MG/ML  SOLN

[Series 2: routine abd/pel with · axial · 0.74mm/px · z∈[-539,-114]mm · 11 of 97 slices shown, 13 images]
[im 6/97  soft-tissue]
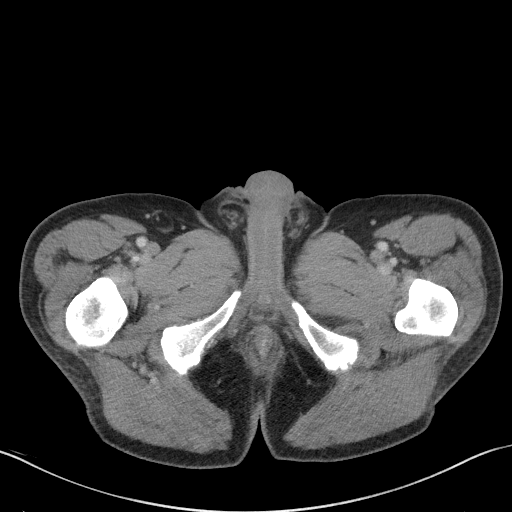
[im 6/97  bone]
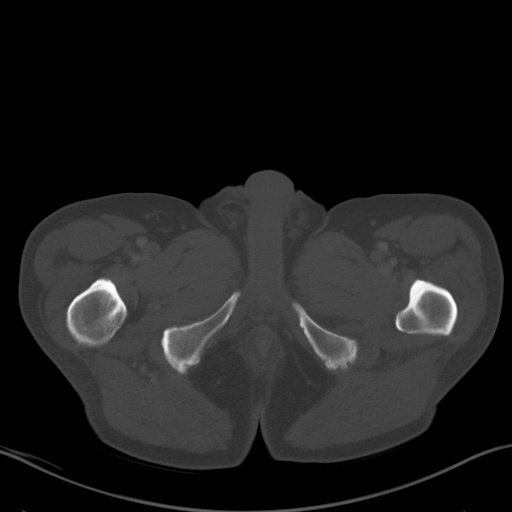
[im 17/97  soft-tissue]
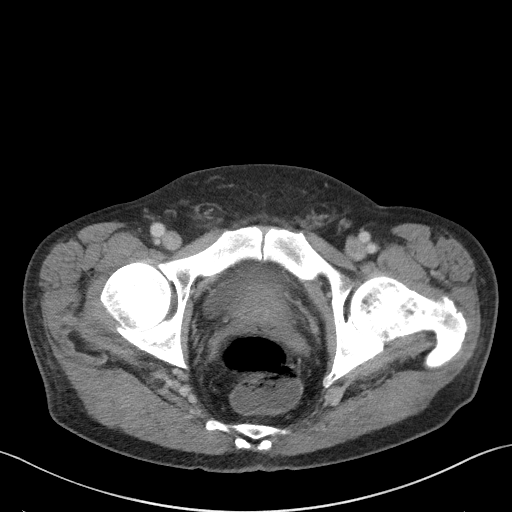
[im 22/97  soft-tissue]
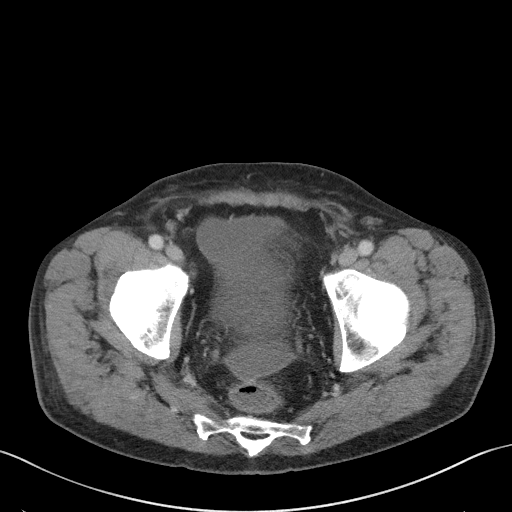
[im 33/97  soft-tissue]
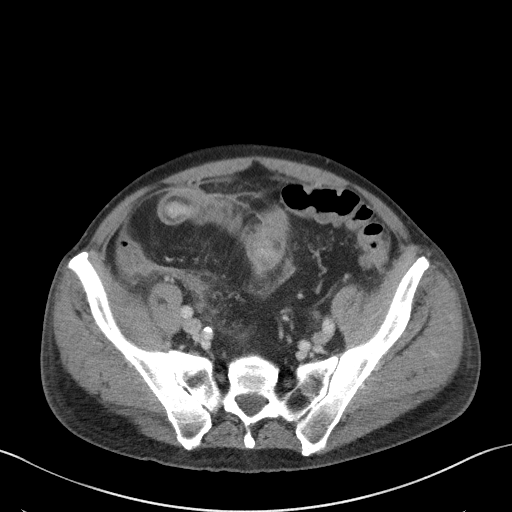
[im 38/97  soft-tissue]
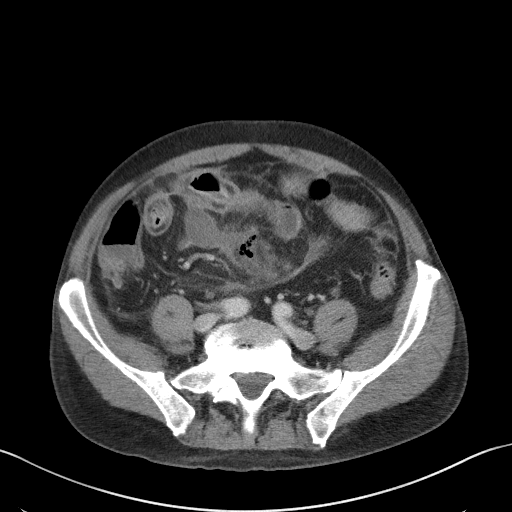
[im 49/97  soft-tissue]
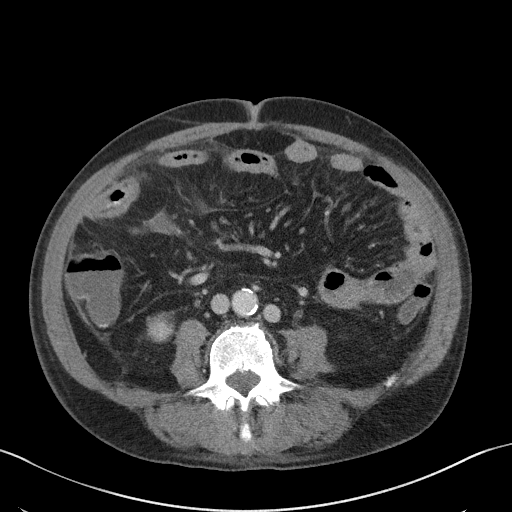
[im 59/97  soft-tissue]
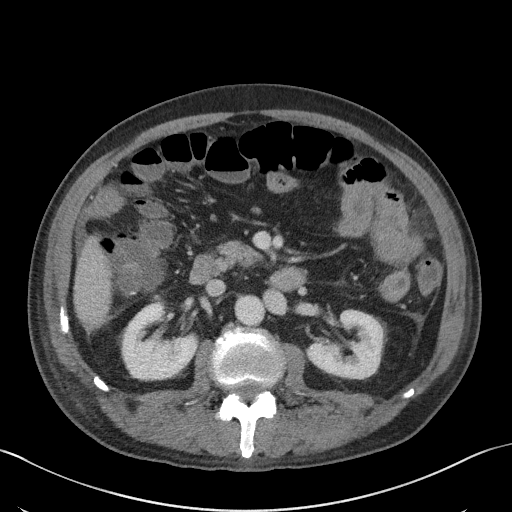
[im 65/97  soft-tissue]
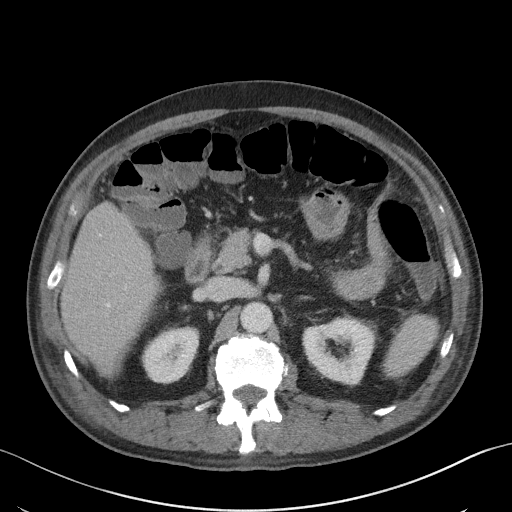
[im 75/97  soft-tissue]
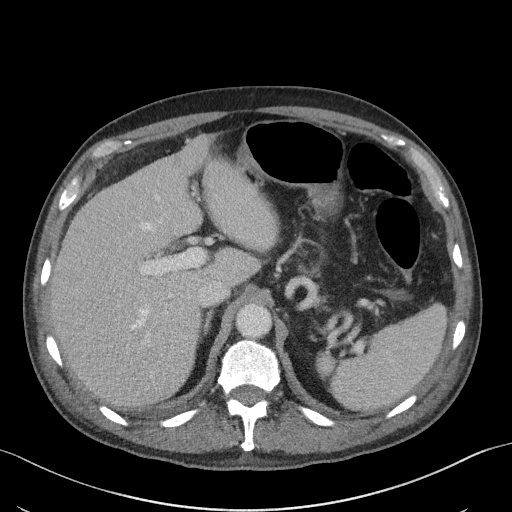
[im 75/97  bone]
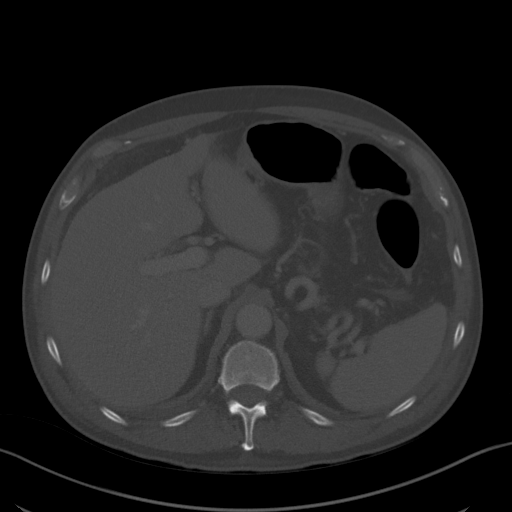
[im 81/97  soft-tissue]
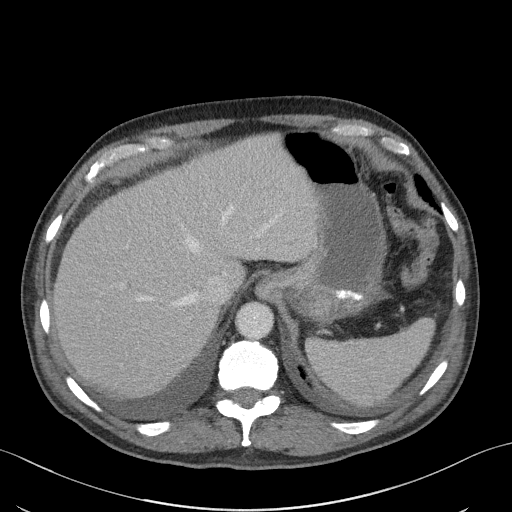
[im 91/97  soft-tissue]
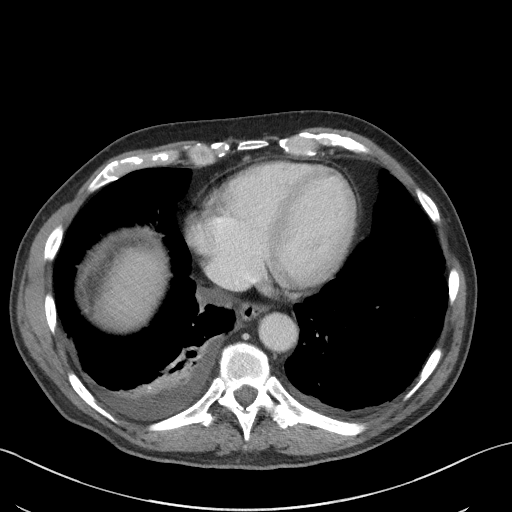

[Series 5: coronal st · coronal · 0.66mm/px · 3 of 99 slices shown]
[im 33/99  soft-tissue]
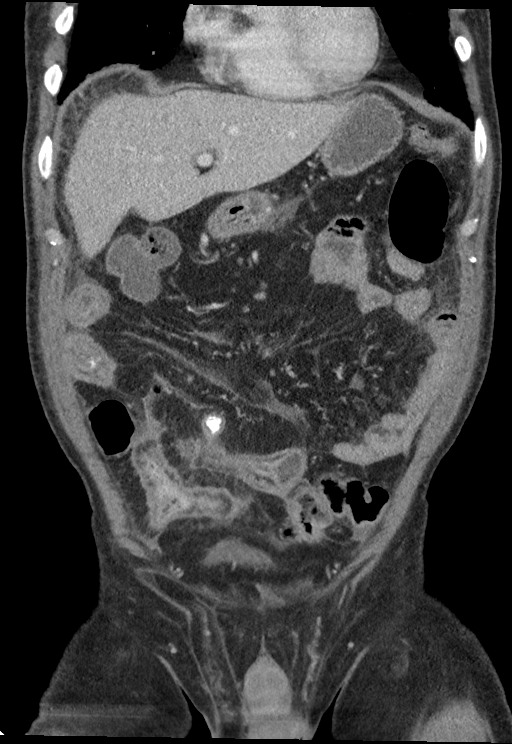
[im 44/99  soft-tissue]
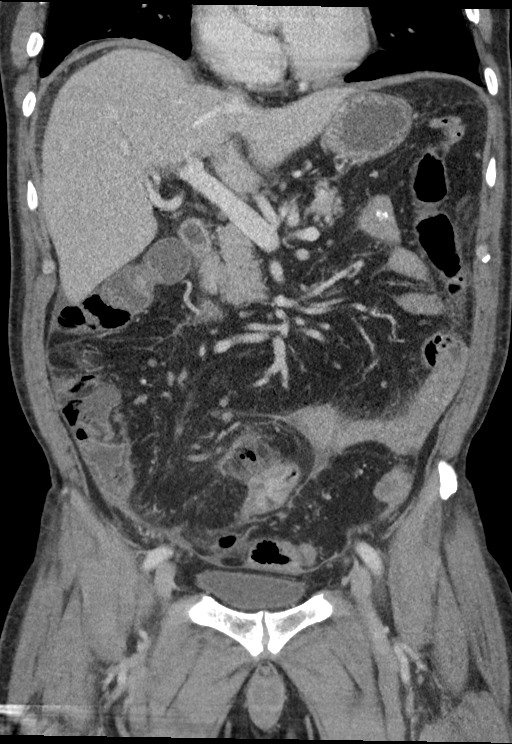
[im 55/99  soft-tissue]
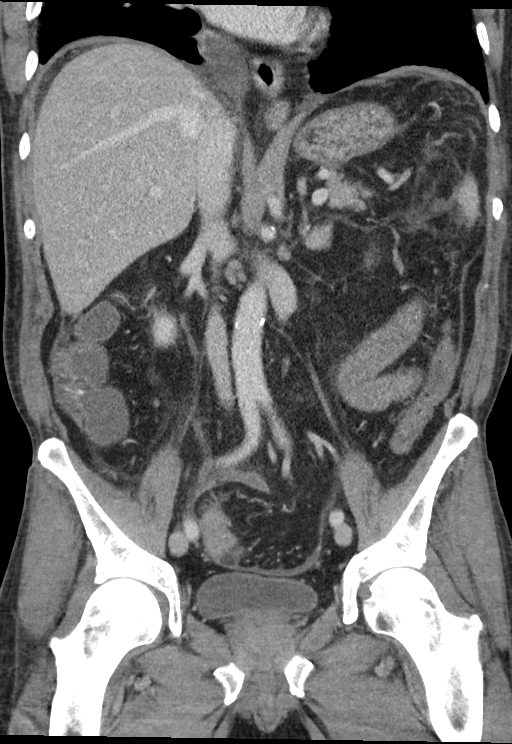

[14 of 46 positions shown; findings below may reference images not displayed]

FINDINGS: Lower chest: Small bilateral pleural effusions are noted right
greater than left with associated right basilar atelectatic changes.
Scattered calcified granulomas are seen.

Hepatobiliary: Tiny hypodensity is noted in the dome of the liver
posteriorly best seen on image number 9 of series 2 stable from the
prior exam. A few scattered hypodensities are seen stable from the
prior study. Status post cholecystectomy. No biliary dilatation.

Pancreas: Unremarkable. No pancreatic ductal dilatation or
surrounding inflammatory changes.

Spleen: Normal in size without focal abnormality.

Adrenals/Urinary Tract: Adrenal glands are stable with a small right
adrenal nodule identified. Normal enhancement of the kidneys is seen
with the exception of a right lower pole mildly enhancing lesion
which is stable from the prior exam. No obstructive changes are
seen. The bladder is partially distended.

Stomach/Bowel: Colon shows no obstructive changes. Some surrounding
inflammatory changes noted related to the small bowel inflammatory
change. A small amount of free fluid is noted within the pelvis
which is new from the prior exam. No free air is seen. Diffuse
inflammatory changes of the distal ileum are again identified with
multiple diverticuli is some which contain dense material likely
related to ingested content. The overall appearance is stable from
the prior exam with the exception of a new air-fluid collection
identified in the right mid abdomen. The fluid component measures
approximately 2.7 x 2.4 cm in greatest dimension. It extends to an
area of mottled extraluminal air best seen on image number 60 of
series 2. A small adjacent air-fluid collection is noted best seen
on image number 57 of series 2. These are consistent with small
abscesses likely related to a micro perforation. An additional small
air-fluid collection is noted in the right mid abdomen on image
number 52 of series 2. Stomach is within normal limits. The proximal
small bowel is unremarkable.

Vascular/Lymphatic: Duplicated IVC is noted. Aortic calcifications
are seen. No significant lymphadenopathy is noted.

Reproductive: Prostate is unremarkable.

Other: Free fluid is noted within the pelvis consistent with an
interval perforation. No significant free air is noted. Previously
described abscesses are noted.

Musculoskeletal: Degenerative changes of lumbar spine are seen. No
acute bony abnormality is noted.
IMPRESSION: Persistent changes likely related to inflammatory bowel disease
within the distal ileum. There has been interval development of
multiple air-fluid collections within the right mid and central
abdomen. The largest of these measures approximately 2.7 x 2.4 cm as
described above. The air-fluid collections appear to inter
communicate enter likely related to rupture of a small ileal
diverticulum. Mild free fluid in the pelvis is noted.

Bilateral pleural effusions with right basilar atelectasis.

The remainder of the exam is stable from the prior study.

## 2021-05-19 IMAGING — CT CT IMAGE GUIDED DRAINAGE BY PERCUTANEOUS CATHETER
1 of 3 series · 13 of 32 positions shown, 18 images · non-contrast
Comparison: CT abdomen pelvis-02/21/2019;

INDICATION: Concern for inflammatory bowel disease, now with enteric
perforation. Please perform CT-guided aspiration and/or drainage
catheter placement for infection source control purposes.

EXAM:
CT IMAGE GUIDED DRAINAGE BY PERCUTANEOUS CATHETER

[Series 2: i-spiral 5.0 b30f · axial · 0.74mm/px · z∈[-361,-232]mm · 13 of 43 slices shown, 18 images]
[im 3/43  soft-tissue]
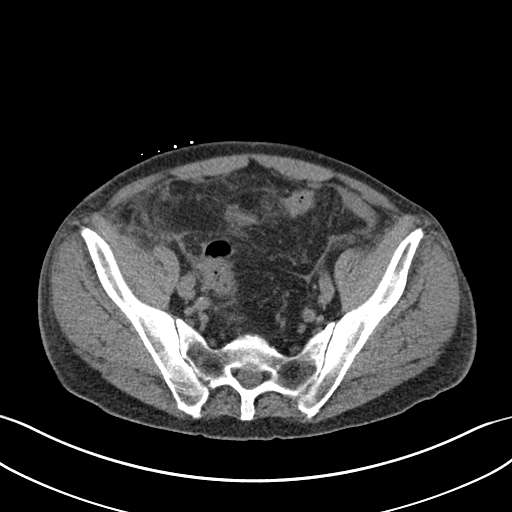
[im 3/43  bone]
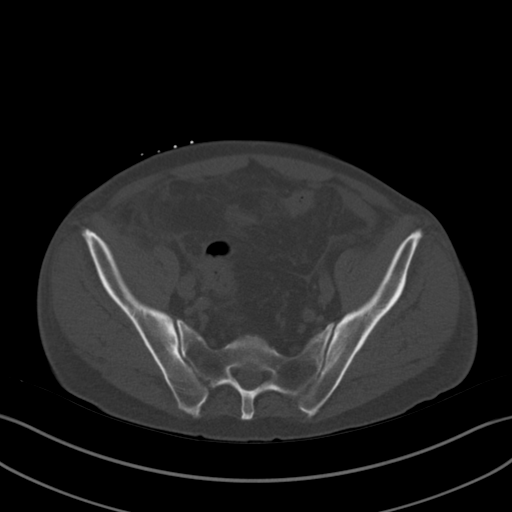
[im 6/43  soft-tissue]
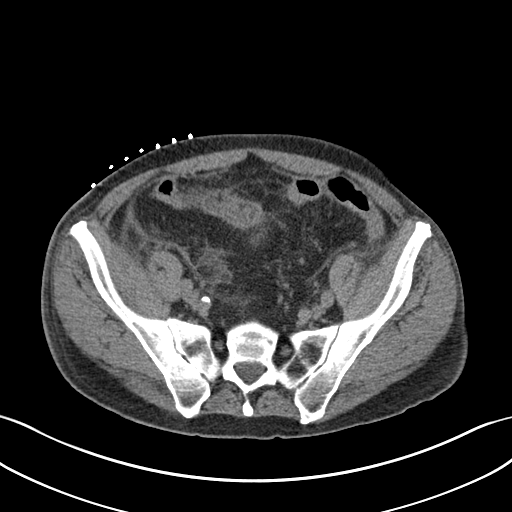
[im 11/43  soft-tissue]
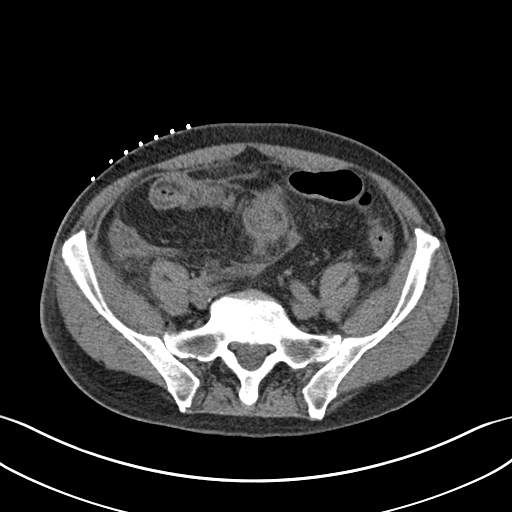
[im 14/43  soft-tissue]
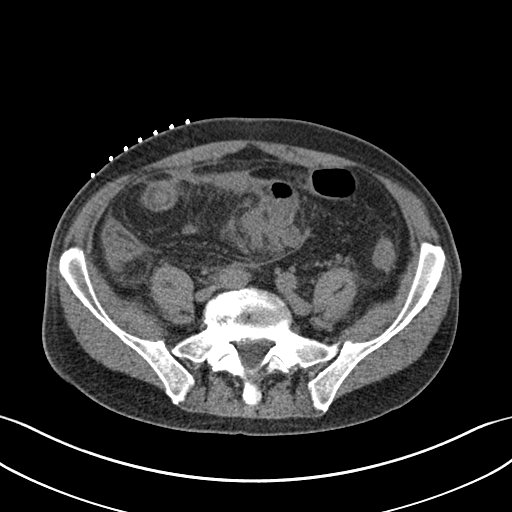
[im 16/43  soft-tissue]
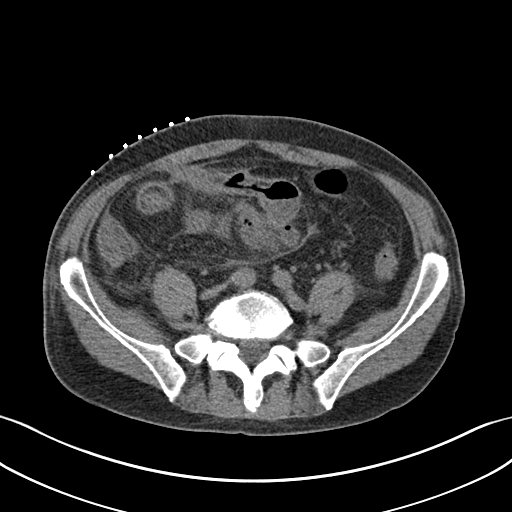
[im 19/43  soft-tissue]
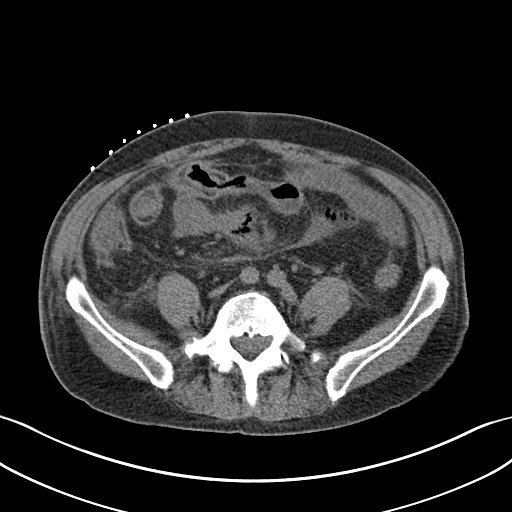
[im 24/43  soft-tissue]
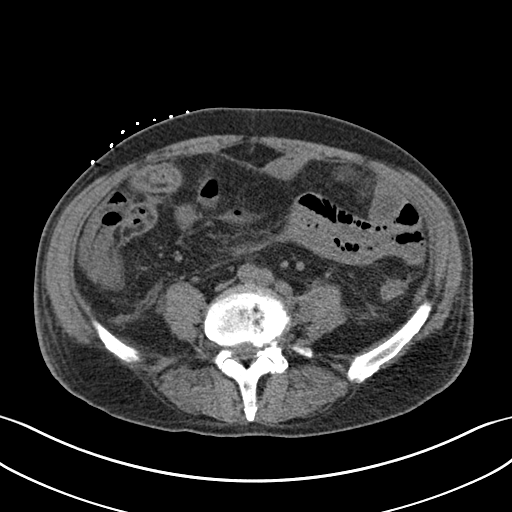
[im 27/43  soft-tissue]
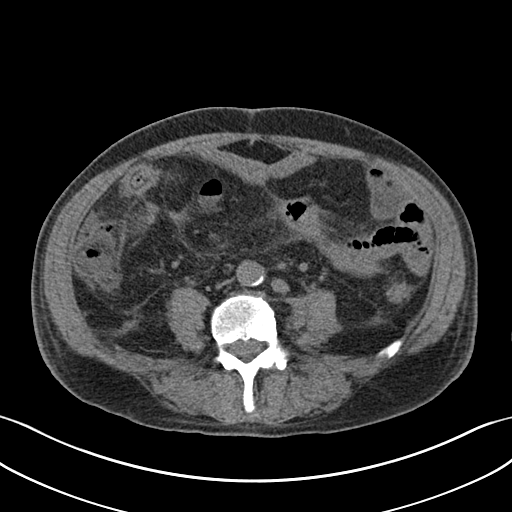
[im 29/43  soft-tissue]
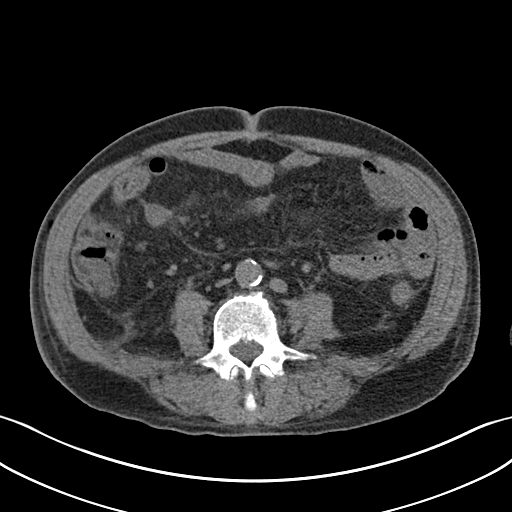
[im 29/43  bone]
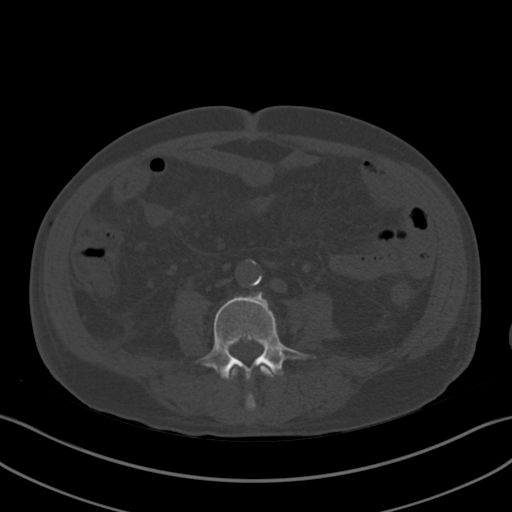
[im 32/43  soft-tissue]
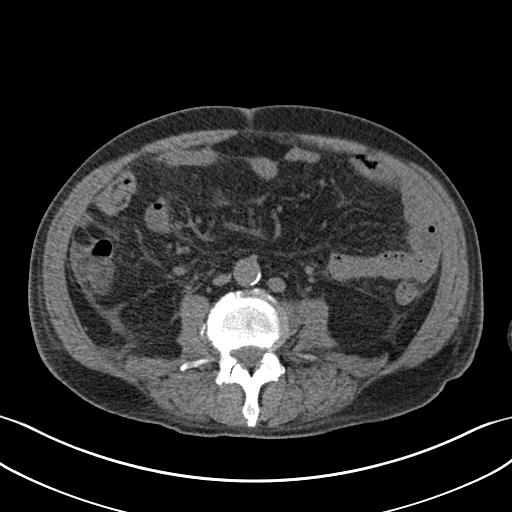
[im 32/43  lung]
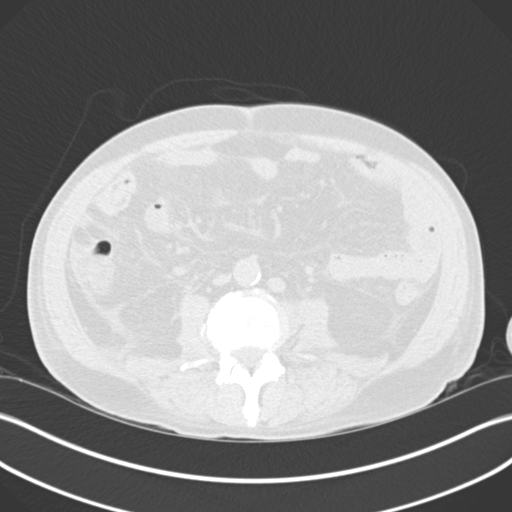
[im 35/43  lung]
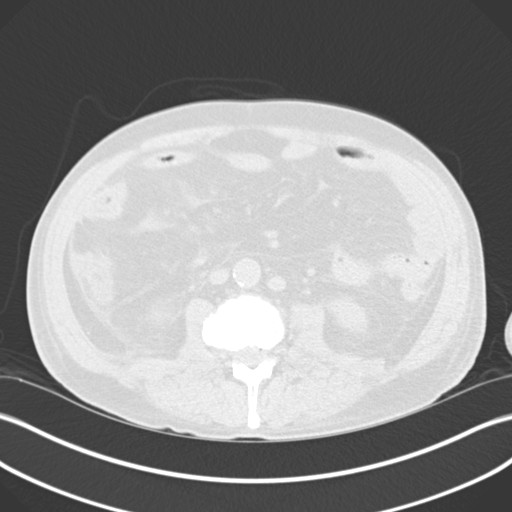
[im 37/43  soft-tissue]
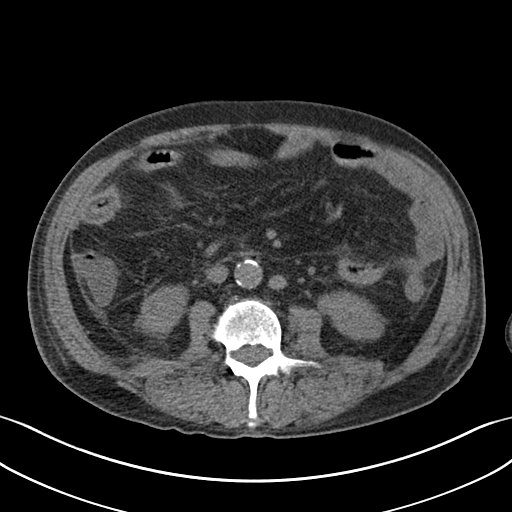
[im 37/43  lung]
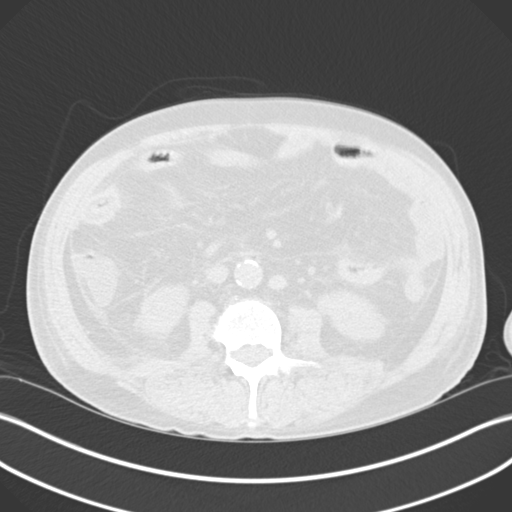
[im 40/43  soft-tissue]
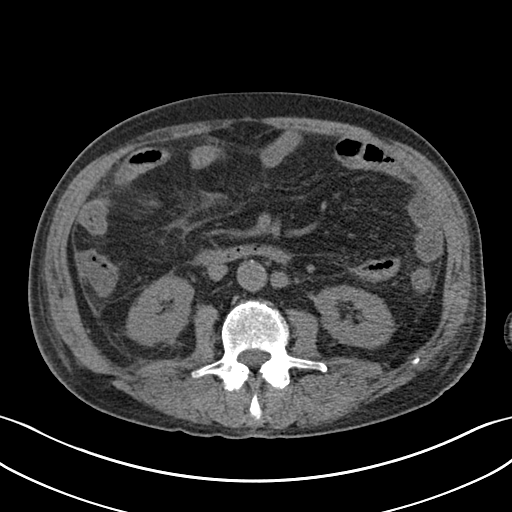
[im 40/43  lung]
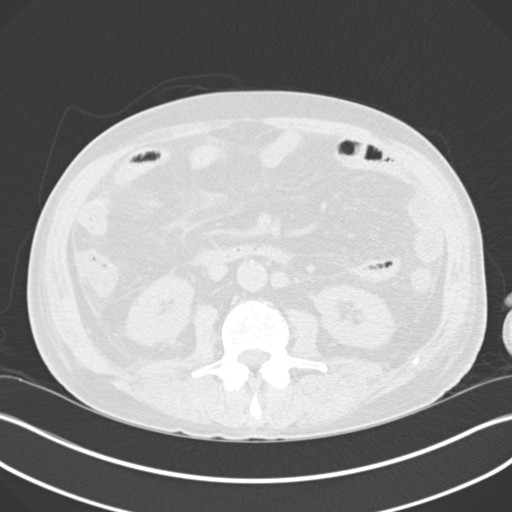

[13 of 32 positions shown; findings below may reference images not displayed]

02/15/2019

MEDICATIONS:
The patient is currently admitted to the hospital and receiving
intravenous antibiotics. The antibiotics were administered within an
appropriate time frame prior to the initiation of the procedure.

ANESTHESIA/SEDATION:
Moderate (conscious) sedation was employed during this procedure. A
total of Versed 2 mg and Fentanyl 100 mcg was administered
intravenously.

Moderate Sedation Time: 11 minutes. The patient's level of
consciousness and vital signs were monitored continuously by
radiology nursing throughout the procedure under my direct
supervision.

CONTRAST:  None

COMPLICATIONS:
None immediate.

PROCEDURE:
Informed written consent was obtained from the patient after a
discussion of the risks, benefits and alternatives to treatment. The
patient was placed supine on the CT gantry and a pre procedural CT
was performed re-demonstrating the known interloop abscess/fluid
collection within the right lower abdomen/pelvis with dominant
bilobed serpiginous component measuring approximately 6.5 x 2.3 cm
(image 26, series 2). The procedure was planned. A timeout was
performed prior to the initiation of the procedure.

The skin overlying the anterolateral aspect of the right lower
abdomen was prepped and draped in the usual sterile fashion. The
overlying soft tissues were anesthetized with 1% lidocaine with
epinephrine. Appropriate trajectory was planned with the use of a 22
gauge spinal needle. An 18 gauge trocar needle was advanced into the
abscess/fluid collection and a short Amplatz super stiff wire was
coiled within the collection. Appropriate positioning was confirmed
with a limited CT scan. The tract was serially dilated allowing
placement of a 10 French all-purpose drainage catheter. Appropriate
positioning was confirmed with a limited postprocedural CT scan.

Approximately 35 ml of purulent fluid was aspirated. The tube was
connected to a JP bulb and sutured in place. A dressing was placed.
The patient tolerated the procedure well without immediate post
procedural complication.
IMPRESSION: Successful CT guided placement of a 10 French all purpose drain
catheter into the interloop abscess within the right lower
abdomen/pelvis with aspiration of 35 mL of purulent fluid. Samples
were sent to the laboratory as requested by the ordering clinical
team.

## 2021-06-18 ENCOUNTER — Ambulatory Visit: Payer: Medicare HMO | Admitting: Gastroenterology

## 2021-06-18 ENCOUNTER — Encounter: Payer: Self-pay | Admitting: Gastroenterology

## 2021-06-18 ENCOUNTER — Other Ambulatory Visit: Payer: Self-pay

## 2021-06-18 VITALS — BP 175/94 | HR 84 | Temp 97.6°F | Ht 70.0 in | Wt 166.2 lb

## 2021-06-18 DIAGNOSIS — D509 Iron deficiency anemia, unspecified: Secondary | ICD-10-CM | POA: Diagnosis not present

## 2021-06-18 DIAGNOSIS — K50012 Crohn's disease of small intestine with intestinal obstruction: Secondary | ICD-10-CM | POA: Diagnosis not present

## 2021-06-18 MED ORDER — BUDESONIDE 3 MG PO CPEP: 3.0000 mg | ORAL_CAPSULE | Freq: Every day | ORAL | 1 refills | Status: DC

## 2021-06-18 MED ORDER — COLESTIPOL HCL 1 G PO TABS: 2.0000 g | ORAL_TABLET | Freq: Two times a day (BID) | ORAL | 1 refills | Status: DC

## 2021-06-18 NOTE — Progress Notes (Signed)
?  ?[name], MD ?[address]  ?[address]  ?[place], [place] [address]  ?Main: [phone]  ?Fax: [phone] ? ? ? ?Gastroenterology Consultation ? ?Referring Provider:     Sofie Hartigan, MD ?Primary Care Physician:  Sofie Hartigan, MD ?Primary Gastroenterologist:  Dr. Sherri Sear ?Reason for Consultation:  small bowel Crohn's ?      ? HPI:   ?Todd Briggs is a 76 y.o. male referred by Dr. 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  Patient was originally admitted on 02/15/2019 secondary to right lower abdominal pain associated with nonbloody loose bowel movements. Patient underwent CT abdomen which revealed fairly extensive inflammatory ileitis with mucosal and serosal enhancement, submucosal edema, multiple ileal diverticulosis.  Patient was treated with antibiotics during that admission and was discharged home.  He was readmitted on 02/21/2019 due to ongoing symptoms, repeat CT revealed multiple air-fluid collections, intercommunicating, likely related to rupture of the small ileal diverticulum.  Patient underwent CT-guided drainage of the abscess, drain placement, antibiotics. ? ?He subsequently underwent outpatient colonoscopy after healing of the abscess in 03/2019 which revealed terminal ileitis and normal colon.  Biopsies confirmed active patchy ileitis with no evidence of chronicity.  Random colon biopsies were unremarkable. ? ?Patient developed recurrence of abscess with worsening of right lower quadrant pain, evaluated by Dr. Victorio Palm, colorectal surgeon at [facility]. He underwent laparoscopic ileocecectomy on 09/13/2019 at [facility] by Dr. Victorio Palm, and ileostomy takedown on 12/19/2019 and he recovered well from the surgery.   ?He underwent image guided cryoablation of right renal mass on 06/07/2020.  With regards to his Crohn's disease, unfortunately, patient could not afford high co-pay for Humira.  We tried [facility], he is not able to afford $1100 infusion  charge for entyvio as well.  Patient did not want to apply for patient assistance program because he thinks he will not qualify for it. ? ?Patient is currently being maintained on budesonide 3 mg 2 to 3 pills daily, colestipol 2 pills daily.  He reports that he is having 1 formed bowel movement a day, sometimes a second BM depending on what he eats.  He denies any abdominal bloating.  He is gaining weight.  He denies any abdominal pain, does notice that his stools smell differently since surgery, he denies any rectal bleeding.  Patient does admit to drinking carbonated beverages, sugary drinks daily and he consumes red meat about twice a week.  Labs from 05/2020 revealed mild iron deficiency anemia. ? ? ?NSAIDs: None ? ?Antiplts/Anticoagulants/Anti thrombotics: None ? ?GI Procedures:  ?Colonoscopy 03/20/2020 ?- Patent end-to-side ileo-colonic anastomosis, characterized by healthy appearing mucosa and an intact staple line.  Could not traverse the neoterminal ileum due to postsurgical anatomy despite several attempts ?- The entire examined colon is normal. ?- The distal rectum and anal verge are normal on retroflexion view. ? ?Colonoscopy 03/2019 ?Terminal ileitis, normal colon ? ?DIAGNOSIS:  ?A. TERMINAL ILEUM; BIOPSY:  ?- PATCHY ACTIVE ILEITIS.  ?- SEE COMMENT.  ? ?Comment:  ?The findings are nonspecific.  Diagnostic considerations would include  ?self-limited / infectious enteritis / colitis, medication effects,  ?inflammatory bowel disease, etc.  Clinical correlation is recommended.  ?There is no evidence of dysplasia or malignancy.  ? ?B. COLON, RANDOM; BIOPSY:  ?- COLONIC MUCOSA WITH NO SIGNIFICANT PATHOLOGIC ALTERATION.  ?- NEGATIVE FOR MICROSCOPIC COLITIS, DYSPLASIA, AND MALIGNANCY.  ? ?Laparoscopic ileocecectomy 09/13/2019 ?Final Diagnosis    ?A: Terminal ileum and cecum, resection ?-Small intestine  with chronic active ileitis, stricture, ulcerations, and diverticula (see comment) ?-Severe acute serositis with  fibrinopurulent exudate ?-No granulomas identified ?-Appendix with fibrous obliteration  ?-Four lymph nodes, negative for malignancy (0/4) ?-Proximal margin appears histologically viable and is involved by mucosal ulceration ?-Distal margin appears histologically viable ?-No dysplasia or malignancy  ? ?The purpose of this addendum is to report the results of additional special stains.  An EBV ISH is negative.  A GMS stain is negative. A CMV stain was performed and is negative. There are no changes to the original diagnosis. ? ?Past Medical History:  ?Diagnosis Date  ? Abdominal pain 02/15/2019  ? Allergy   ? Seasonal  ? Anemia   ? Arthritis   ? Basal cell carcinoma   ? Removed 1980's, renal  ? Cataracts, both eyes   ? Chronic kidney disease   ? Crohn's colitis, with abscess ([place]) 08/2019  ? Deaf, left   ? Diarrhea 02/21/2019  ? Diverticulitis   ? GERD (gastroesophageal reflux disease)   ? Hypokalemia 02/21/2019  ? IBS (irritable bowel syndrome)   ? Peptic ulcer 76 years old  ? Scarlet fever 75 years old  ? Shortness of breath dyspnea 1992  ? with exertion due to chemical at work  ? Wears dentures   ? full upper  ? Wears hearing aid in right ear   ? ? ?Past Surgical History:  ?Procedure Laterality Date  ? CATARACT EXTRACTION W/ INTRAOCULAR LENS  IMPLANT, BILATERAL    ? CHOLECYSTECTOMY N/A 03/30/2015  ? Procedure: LAPAROSCOPIC CHOLECYSTECTOMY;  Surgeon: Hubbard Robinson, MD;  Location: [facility];  Service: General;  Laterality: N/A;  ? COLON SURGERY  09/13/2019  ? .ileostomy. Pt reports complications  ? COLONOSCOPY WITH PROPOFOL N/A 04/15/2019  ? Procedure: COLONOSCOPY WITH BIOPSY AND DILATION;  Surgeon: [name], MD;  Location: [facility];  Service: Endoscopy;  Laterality: N/A;  Priority 3  ? COLONOSCOPY WITH PROPOFOL N/A 03/20/2020  ? Procedure: COLONOSCOPY WITH PROPOFOL;  Surgeon: Lin Landsman, MD;  Location: [facility] ENDOSCOPY;  Service: Gastroenterology;  Laterality: N/A;  ? EYE SURGERY    ? HERNIA  REPAIR Bilateral 76 years old  ? Inguinal Hernia  ? IR RADIOLOGIST EVAL & MGMT  04/24/2020  ? IR RADIOLOGIST EVAL & MGMT  07/03/2020  ? IR RADIOLOGIST EVAL & MGMT  10/03/2020  ? IR RADIOLOGIST EVAL & MGMT  04/16/2021  ? lasik    ? RADIOLOGY WITH ANESTHESIA Right 06/06/2020  ? Procedure: RADIOLOGY WITH ANESTHESIA  CRYOABLATION OF RIGHT RENAL MASS;  Surgeon: Arne Cleveland, MD;  Location: [facility];  Service: Radiology;  Laterality: Right;  ? reanastomosis of ileostomy  2021  ? ? ?Current Outpatient Medications:  ?  acetaminophen (TYLENOL) 500 MG tablet, Take 500 mg by mouth every 8 (eight) hours as needed for moderate pain., Disp: , Rfl:  ?  cetirizine (ZYRTEC) 10 MG tablet, Take 10 mg by mouth daily., Disp: , Rfl:  ?  ibuprofen (ADVIL) 200 MG tablet, Take 200 mg by mouth every 8 (eight) hours as needed for moderate pain., Disp: , Rfl:  ?  budesonide (ENTOCORT EC) 3 MG 24 hr capsule, Take 1 capsule (3 mg total) by mouth daily., Disp: 90 capsule, Rfl: 1 ?  colestipol (COLESTID) 1 g tablet, Take 2 tablets (2 g total) by mouth 2 (two) times daily., Disp: 360 tablet, Rfl: 1 ? ? ?Family History  ?Problem Relation Age of Onset  ? Hypertension Mother   ? Diabetes Mother   ?  Pancreatitis Mother   ? Heart disease Mother   ? Heart attack Father   ? COPD Father   ? Heart disease Father   ?  ? ?Social History  ? ?Tobacco Use  ? Smoking status: Former  ?  Packs/day: 1.00  ?  Types: Cigarettes  ?  Quit date: 02/28/1985  ?  Years since quitting: 36.3  ? Smokeless tobacco: Never  ?Vaping Use  ? Vaping Use: Never used  ?Substance Use Topics  ? Alcohol use: Yes  ?  Comment: rare  ? Drug use: No  ? ? ?Allergies as of 06/18/2021 - Review Complete 06/18/2021  ?Allergen Reaction Noted  ? Penicillins Rash 12/05/2013  ? ? ?Review of Systems:    ?All systems reviewed and negative except where noted in HPI. ? ? Physical Exam:  ?BP (!) 175/94 (BP Location: Left Arm, Patient Position: Sitting, Cuff Size: Normal)   Pulse 84   Temp 97.6 ?F (36.4 ?C)  (Oral)   Ht 5' 10"  (1.778 m)   Wt 166 lb 4 oz (75.4 kg)   BMI 23.85 kg/m?  ?No LMP for male patient. ? ?General:   Alert,  Well-developed, well-nourished, pleasant and cooperative in NAD ?Head:  Normocephali

## 2021-06-19 LAB — IRON,TIBC AND FERRITIN PANEL
Ferritin: 33 ng/mL (ref 30–400)
Iron Saturation: 29 % (ref 15–55)
Iron: 103 ug/dL (ref 38–169)
Total Iron Binding Capacity: 361 ug/dL (ref 250–450)
UIBC: 258 ug/dL (ref 111–343)

## 2021-06-19 LAB — CBC
Hematocrit: 39.7 % (ref 37.5–51.0)
Hemoglobin: 13.1 g/dL (ref 13.0–17.7)
MCH: 27.7 pg (ref 26.6–33.0)
MCHC: 33 g/dL (ref 31.5–35.7)
MCV: 84 fL (ref 79–97)
Platelets: 251 10*3/uL (ref 150–450)
RBC: 4.73 x10E6/uL (ref 4.14–5.80)
RDW: 13 % (ref 11.6–15.4)
WBC: 8.7 10*3/uL (ref 3.4–10.8)

## 2021-06-19 LAB — COMPREHENSIVE METABOLIC PANEL
ALT: 17 IU/L (ref 0–44)
AST: 19 IU/L (ref 0–40)
Albumin/Globulin Ratio: 1.9 (ref 1.2–2.2)
Albumin: 4.1 g/dL (ref 3.7–4.7)
Alkaline Phosphatase: 73 IU/L (ref 44–121)
BUN/Creatinine Ratio: 14 (ref 10–24)
BUN: 17 mg/dL (ref 8–27)
Bilirubin Total: 0.4 mg/dL (ref 0.0–1.2)
CO2: 26 mmol/L (ref 20–29)
Calcium: 8.9 mg/dL (ref 8.6–10.2)
Chloride: 105 mmol/L (ref 96–106)
Creatinine, Ser: 1.22 mg/dL (ref 0.76–1.27)
Globulin, Total: 2.2 g/dL (ref 1.5–4.5)
Glucose: 96 mg/dL (ref 70–99)
Potassium: 3.3 mmol/L — ABNORMAL LOW (ref 3.5–5.2)
Sodium: 145 mmol/L — ABNORMAL HIGH (ref 134–144)
Total Protein: 6.3 g/dL (ref 6.0–8.5)
eGFR: 61 mL/min/{1.73_m2} (ref >59)

## 2021-06-19 LAB — C-REACTIVE PROTEIN: CRP: 2 mg/L (ref 0–10)

## 2021-06-19 LAB — B12 AND FOLATE PANEL
Folate: 10.8 ng/mL (ref >3.0)
Vitamin B-12: 324 pg/mL (ref 232–1245)

## 2021-06-21 ENCOUNTER — Telehealth: Payer: Self-pay | Admitting: Gastroenterology

## 2021-06-21 NOTE — Telephone Encounter (Signed)
[facility] called. Asked about the prescription directions. Is patient getting SKYRIZI infusion at home or going to a facility to get the infusion done? ?

## 2021-06-24 LAB — CALPROTECTIN, FECAL: Calprotectin, Fecal: 51 ug/g (ref 0–120)

## 2021-06-24 NOTE — Telephone Encounter (Signed)
Sent Todd Briggs a email about this because I thought they were always at home unless insurance did not cover it.  ?

## 2021-06-24 NOTE — Telephone Encounter (Signed)
[facility] called back and informed [facility] he would be getting infusion at home  ?

## 2021-07-01 ENCOUNTER — Other Ambulatory Visit: Payer: Self-pay | Admitting: Gastroenterology

## 2021-08-12 ENCOUNTER — Other Ambulatory Visit: Payer: Self-pay | Admitting: Gastroenterology

## 2021-08-13 NOTE — Telephone Encounter (Signed)
[name] reach out to me and said they have been trying to get in touch with the patient but the patient will not returned there calls. Tried to call patient and patient states he is not taking the medication because he is not paying for it.

## 2021-09-27 ENCOUNTER — Ambulatory Visit: Payer: Medicare HMO | Admitting: Physician Assistant

## 2021-09-27 ENCOUNTER — Encounter: Payer: Self-pay | Admitting: Physician Assistant

## 2021-09-27 VITALS — BP 147/87 | HR 81 | Ht 70.0 in | Wt 168.0 lb

## 2021-09-27 DIAGNOSIS — R3129 Other microscopic hematuria: Secondary | ICD-10-CM | POA: Diagnosis not present

## 2021-09-27 DIAGNOSIS — R109 Unspecified abdominal pain: Secondary | ICD-10-CM

## 2021-09-27 LAB — URINALYSIS, COMPLETE
Bilirubin, UA: NEGATIVE
Glucose, UA: NEGATIVE
Leukocytes,UA: NEGATIVE
Nitrite, UA: NEGATIVE
Specific Gravity, UA: 1.02 (ref 1.005–1.030)
Urobilinogen, Ur: 0.2 mg/dL (ref 0.2–1.0)
pH, UA: 5.5 (ref 5.0–7.5)

## 2021-09-27 LAB — MICROSCOPIC EXAMINATION

## 2021-09-27 NOTE — Patient Instructions (Signed)
Cystoscopy Cystoscopy is a procedure that is used to help diagnose and sometimes treat conditions that affect the lower urinary tract. The lower urinary tract includes the bladder and the urethra. The urethra is the tube that drains urine from the bladder. Cystoscopy is done using a thin, tube-shaped instrument with a light and camera at the end (cystoscope). The cystoscope may be hard or flexible, depending on the goal of the procedure. The cystoscope is inserted through the urethra, into the bladder. Cystoscopy may be recommended if you have: Urinary tract infections that keep coming back. Blood in the urine (hematuria). An inability to control when you urinate (urinary incontinence) or an overactive bladder. Unusual cells found in a urine sample. A blockage in the urethra, such as a urinary stone. Painful urination. An abnormality in the bladder found during an intravenous pyelogram (IVP) or CT scan. What are the risks? Generally, this is a safe procedure. However, problems may occur, including: Infection. Bleeding.  What happens during the procedure?  You will be given one or more of the following: A medicine to numb the area (local anesthetic). The area around the opening of your urethra will be cleaned. The cystoscope will be passed through your urethra into your bladder. Germ-free (sterile) fluid will flow through the cystoscope to fill your bladder. The fluid will stretch your bladder so that your health care provider can clearly examine your bladder walls. Your doctor will look at the urethra and bladder. The cystoscope will be removed The procedure may vary among health care providers  What can I expect after the procedure? After the procedure, it is common to have: Some soreness or pain in your urethra. Urinary symptoms. These include: Mild pain or burning when you urinate. Pain should stop within a few minutes after you urinate. This may last for up to a few days after the  procedure. A small amount of blood in your urine for several days. Feeling like you need to urinate but producing only a small amount of urine. Follow these instructions at home: General instructions Return to your normal activities as told by your health care provider.  Drink plenty of fluids after the procedure. Keep all follow-up visits as told by your health care provider. This is important. Contact a health care provider if you: Have pain that gets worse or does not get better with medicine, especially pain when you urinate lasting longer than 72 hours after the procedure. Have trouble urinating. Get help right away if you: Have blood clots in your urine. Have a fever or chills. Are unable to urinate. Summary Cystoscopy is a procedure that is used to help diagnose and sometimes treat conditions that affect the lower urinary tract. Cystoscopy is done using a thin, tube-shaped instrument with a light and camera at the end. After the procedure, it is common to have some soreness or pain in your urethra. It is normal to have blood in your urine after the procedure.  If you were prescribed an antibiotic medicine, take it as told by your health care provider.  This information is not intended to replace advice given to you by your health care provider. Make sure you discuss any questions you have with your health care provider. Document Revised: 02/02/2018 Document Reviewed: 02/02/2018 Elsevier Patient Education  2020 [employer]

## 2021-09-27 NOTE — Progress Notes (Signed)
09/27/2021 5:10 PM   Todd Briggs 10-13-1945 [id number]  CC: Chief Complaint  Patient presents with   Follow-up    Lower back pain   HPI: Todd Briggs is a 76 y.o. male with PMH right renal mass s/p cryoablation on 06/06/2020 who presents today for evaluation of low back pain.   Today he reports a 2-day history of right flank aching that occurred after significant mowing outdoors.  He used a heating pad yesterday for 6 hours and rested and his pain has significantly improved.  He describes some persistent flank soreness today.  He denies gross hematuria, fever, chills, nausea, vomiting, dysuria, unintentional weight loss, cough, or shortness of breath.  No known history of nephrolithiasis.  In-office UA today positive for trace ketones, 1+ blood, and 1+ protein; urine microscopy with 3-10 RBCs/HPF.  PMH: Past Medical History:  Diagnosis Date   Abdominal pain 02/15/2019   Allergy    Seasonal   Anemia    Arthritis    Basal cell carcinoma    Removed 1980's, renal   Cataracts, both eyes    Chronic kidney disease    Crohn's colitis, with abscess ([place]) 08/2019   Deaf, left    Diarrhea 02/21/2019   Diverticulitis    GERD (gastroesophageal reflux disease)    Hypokalemia 02/21/2019   IBS (irritable bowel syndrome)    Peptic ulcer 76 years old   Scarlet fever 76 years old   Shortness of breath dyspnea 1992   with exertion due to chemical at work   Wears dentures    full upper   Wears hearing aid in right ear     Surgical History: Past Surgical History:  Procedure Laterality Date   CATARACT EXTRACTION W/ INTRAOCULAR LENS  IMPLANT, BILATERAL     CHOLECYSTECTOMY N/A 03/30/2015   Procedure: LAPAROSCOPIC CHOLECYSTECTOMY;  Surgeon: Hubbard Robinson, MD;  Location: [facility];  Service: General;  Laterality: N/A;   COLON SURGERY  09/13/2019   .ileostomy. Pt reports complications   COLONOSCOPY WITH PROPOFOL N/A 04/15/2019   Procedure: COLONOSCOPY WITH BIOPSY AND DILATION;   Surgeon: [name], MD;  Location: [place];  Service: Endoscopy;  Laterality: N/A;  Priority 3   COLONOSCOPY WITH PROPOFOL N/A 03/20/2020   Procedure: COLONOSCOPY WITH PROPOFOL;  Surgeon: Lin Landsman, MD;  Location: [facility] END[facility];  Service: Gastroenterology;  Laterality: N/A;   EYE SURGERY     HERNIA REPAIR Bilateral 76 years old   Inguinal Hernia   IR RADIOLOGIST EVAL & MGMT  04/24/2020   IR RADIOLOGIST EVAL & MGMT  07/03/2020   IR RADIOLOGIST EVAL & MGMT  10/03/2020   IR RADIOLOGIST EVAL & MGMT  04/16/2021   lasik     RADIOLOGY WITH ANESTHESIA Right 06/06/2020   Procedure: RADIOLOGY WITH ANESTHESIA  CRYOABLATION OF RIGHT RENAL MASS;  Surgeon: Arne Cleveland, MD;  Location: [facility];  Service: Radiology;  Laterality: Right;   reanastomosis of ileostomy  2021    Home Medications:  Allergies as of 09/27/2021       Reactions   Penicillins Rash   Occurred as a child Occurred as a child. Tolerates cefepime.         Medication List        Accurate as of September 27, 2021  5:10 PM. If you have any questions, ask your nurse or doctor.          acetaminophen 500 MG tablet Commonly known as: TYLENOL Take 500 mg by mouth every 8 (  eight) hours as needed for moderate pain.   budesonide 3 MG 24 hr capsule Commonly known as: ENTOCORT EC TAKE 3 CAPSULES (9 MG TOTAL) BY MOUTH DAILY.   cetirizine 10 MG tablet Commonly known as: ZYRTEC Take 10 mg by mouth daily.   colestipol 1 g tablet Commonly known as: COLESTID Take 2 tablets (2 g total) by mouth 2 (two) times daily.   ibuprofen 200 MG tablet Commonly known as: ADVIL Take 200 mg by mouth every 8 (eight) hours as needed for moderate pain.        Allergies:  Allergies  Allergen Reactions   Penicillins Rash    Occurred as a child Occurred as a child. Tolerates cefepime.     Family History: Family History  Problem Relation Age of Onset   Hypertension Mother    Diabetes Mother    Pancreatitis Mother     Heart disease Mother    Heart attack Father    COPD Father    Heart disease Father     Social History:   reports that he quit smoking about 36 years ago. His smoking use included cigarettes. He smoked an average of 1 pack per day. He has been exposed to tobacco smoke. He has never used smokeless tobacco. He reports current alcohol use. He reports that he does not use drugs.  Physical Exam: BP (!) 147/87   Pulse 81   Ht 5' 10"  (1.778 m)   Wt 168 lb (76.2 kg)   BMI 24.11 kg/m   Constitutional:  Alert and oriented, no acute distress, nontoxic appearing HEENT: [place], AT Cardiovascular: No clubbing, cyanosis, or edema Respiratory: Normal respiratory effort, no increased work of breathing Skin: No rashes, bruises or suspicious lesions Neurologic: Grossly intact, no focal deficits, moving all 4 extremities Psychiatric: Normal mood and affect  Laboratory Data: Results for orders placed or performed in visit on 09/27/21  Microscopic Examination   Urine  Result Value Ref Range   WBC, UA 0-5 0 - 5 /hpf   RBC, Urine 3-10 (A) 0 - 2 /hpf   Epithelial Cells (non renal) 0-10 0 - 10 /hpf   Casts Present (A) None seen /lpf   Cast Type Hyaline casts N/A   Mucus, UA Present (A) Not Estab.   Bacteria, UA Few None seen/Few  Urinalysis, Complete  Result Value Ref Range   Specific Gravity, UA 1.020 1.005 - 1.030   pH, UA 5.5 5.0 - 7.5   Color, UA Yellow Yellow   Appearance Ur Clear Clear   Leukocytes,UA Negative Negative   Protein,UA 1+ (A) Negative/Trace   Glucose, UA Negative Negative   Ketones, UA Trace (A) Negative   RBC, UA 1+ (A) Negative   Bilirubin, UA Negative Negative   Urobilinogen, Ur 0.2 0.2 - 1.0 mg/dL   Nitrite, UA Negative Negative   Microscopic Examination See below:    Assessment & Plan:   1. Right flank pain VSS, UA rather benign.  Low suspicion for urinary infection.  We will proceed with work-up as below. - Urinalysis, Complete  2. Microscopic hematuria UA today  notable only for microscopic hematuria.  We discussed that I do not think that his right renal mass would still be causing hematuria greater than 1 year following cryoablation.  Given this history as well as his smoking history, I recommend pursuing a hematuria work-up at this time.  I had a lengthy conversation today with the patient regarding the etiology of blood in the urine.  I explained that  blood in the urine can be caused by a myriad of factors, including but not limited to infection, stones, cysts, anticoagulation, and urinary tract malignancies.  I explained that the recommended work-up for blood in the urine is twofold and includes a CT urogram for evaluation of the upper urinary tract including kidneys and ureters as well as a cystoscopy for evaluation of the urethra and bladder.  I explained that these two studies complement one another in reviewing the entire urinary tract for possible causes of bleeding.  I recommended that we proceed with this at this time.  Patient agreed; CTU ordered and follow-up cysto with CTU results scheduled.  - CT HEMATURIA WORKUP; Future  Return in 3 weeks (on 10/18/2021) for Cysto and CTU results with Dr. Diamantina Providence.  Debroah Loop, PA-C  [facility] [address] [place], [place] [address] [phone]

## 2021-10-10 ENCOUNTER — Ambulatory Visit
Admission: RE | Admit: 2021-10-10 | Discharge: 2021-10-10 | Disposition: A | Discharge status: Home / Self Care | Payer: Medicare HMO | Source: Ambulatory Visit | Attending: Physician Assistant | Admitting: Physician Assistant

## 2021-10-10 DIAGNOSIS — R3129 Other microscopic hematuria: Secondary | ICD-10-CM | POA: Insufficient documentation

## 2021-10-10 LAB — POCT I-STAT CREATININE: Creatinine, Ser: 1.4 mg/dL — ABNORMAL HIGH (ref 0.61–1.24)

## 2021-10-10 MED ORDER — IOHEXOL 300 MG/ML  SOLN
100.0000 mL | Freq: Once | INTRAMUSCULAR | Status: AC | PRN
  Administered 2021-10-10: 100 mL via INTRAVENOUS

## 2021-10-13 ENCOUNTER — Other Ambulatory Visit: Payer: Self-pay | Admitting: Gastroenterology

## 2021-10-17 ENCOUNTER — Encounter: Payer: Self-pay | Admitting: Urology

## 2021-10-17 ENCOUNTER — Ambulatory Visit: Payer: Medicare HMO | Admitting: Urology

## 2021-10-17 VITALS — BP 167/97 | HR 76 | Ht 70.0 in | Wt 168.0 lb

## 2021-10-17 DIAGNOSIS — N2889 Other specified disorders of kidney and ureter: Secondary | ICD-10-CM

## 2021-10-17 DIAGNOSIS — R3129 Other microscopic hematuria: Secondary | ICD-10-CM

## 2021-10-17 LAB — MICROSCOPIC EXAMINATION: Bacteria, UA: NONE SEEN

## 2021-10-17 LAB — URINALYSIS, COMPLETE
Bilirubin, UA: NEGATIVE
Glucose, UA: NEGATIVE
Ketones, UA: NEGATIVE
Leukocytes,UA: NEGATIVE
Nitrite, UA: NEGATIVE
Specific Gravity, UA: 1.02 (ref 1.005–1.030)
Urobilinogen, Ur: 0.2 mg/dL (ref 0.2–1.0)
pH, UA: 5.5 (ref 5.0–7.5)

## 2021-10-17 MED ORDER — LIDOCAINE HCL URETHRAL/MUCOSAL 2 % EX GEL: 1.0000 | Freq: Once | CUTANEOUS | Status: DC

## 2021-10-17 NOTE — Progress Notes (Signed)
   10/17/2021 4:44 PM   Todd Briggs 1945-11-20 [id number]  Reason for visit: Follow up microscopic hematuria, history of renal mass  HPI: 76 year old male with a number of comorbidities who previously underwent CT-guided cryoablation of a 2.5 cm right renal mass in April 2022 by IR.  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  He has followed with IR with no evidence of recurrence since that time.  He was recently seen by our PA Debroah Loop on 09/27/2021 with some low back pain, and urinalysis did show some microscopic hematuria with 3-10 RBCs, and she recommended hematuria work-up.  I personally viewed and interpreted the CT dated 10/10/2021 that shows no residual contrast enhancement or suspicious soft tissue at prior ablation site, no other urologic abnormalities.  He was originally set up for cystoscopy today, but he refused today.  We discussed the risk of missing additional pathology by deferring cystoscopy, and I think he has a good understanding of these risks.  We discussed return precautions extensively including gross hematuria or persistent microscopic hematuria.  He prefers to follow-up with urology as needed Continue follow-up with IR regarding surveillance imaging with history of cryoablation for renal mass   [place], [place] [address], [place], [place] [address] [phone]

## 2021-11-07 ENCOUNTER — Other Ambulatory Visit: Payer: Self-pay | Admitting: Interventional Radiology

## 2021-11-07 DIAGNOSIS — N2889 Other specified disorders of kidney and ureter: Secondary | ICD-10-CM

## 2021-11-11 ENCOUNTER — Telehealth: Payer: Self-pay | Admitting: Interventional Radiology

## 2021-11-11 NOTE — Telephone Encounter (Signed)
Patient called in to cancel his f/u MRI and Tele visit with Dr Dwaine Gale in Oct as recommended. He wants to do a 1 year follow up.

## 2021-12-03 ENCOUNTER — Ambulatory Visit: Payer: Medicare HMO

## 2021-12-09 ENCOUNTER — Telehealth: Payer: Medicare HMO

## 2021-12-19 ENCOUNTER — Ambulatory Visit: Payer: Medicare HMO | Admitting: Gastroenterology

## 2022-01-07 ENCOUNTER — Ambulatory Visit: Payer: Medicare HMO | Admitting: Gastroenterology

## 2022-01-07 ENCOUNTER — Encounter: Payer: Self-pay | Admitting: Gastroenterology

## 2022-01-07 VITALS — BP 180/92 | HR 75 | Temp 98.0°F | Ht 70.0 in | Wt 165.4 lb

## 2022-01-07 DIAGNOSIS — K50012 Crohn's disease of small intestine with intestinal obstruction: Secondary | ICD-10-CM | POA: Diagnosis not present

## 2022-01-07 NOTE — Progress Notes (Signed)
Todd Darby, MD [address]  [place]  [place], [place] [address]  Main: [phone]  Fax: [phone]    Gastroenterology Consultation  Referring Provider:     Sofie Hartigan, MD Primary Care Physician:  Sofie Hartigan, MD Primary Gastroenterologist:  Dr. Sherri Sear Reason for Consultation:  small bowel Crohn's        HPI:   Todd Briggs is a 76 y.o. male referred by Dr. 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  Patient was originally admitted on 02/15/2019 secondary to right lower abdominal pain associated with nonbloody loose bowel movements. Patient underwent CT abdomen which revealed fairly extensive inflammatory ileitis with mucosal and serosal enhancement, submucosal edema, multiple ileal diverticulosis.  Patient was treated with antibiotics during that admission and was discharged home.  He was readmitted on 02/21/2019 due to ongoing symptoms, repeat CT revealed multiple air-fluid collections, intercommunicating, likely related to rupture of the small ileal diverticulum.  Patient underwent CT-guided drainage of the abscess, drain placement, antibiotics.  He subsequently underwent outpatient colonoscopy after healing of the abscess in 03/2019 which revealed terminal ileitis and normal colon.  Biopsies confirmed active patchy ileitis with no evidence of chronicity.  Random colon biopsies were unremarkable.  Patient developed recurrence of abscess with worsening of right lower quadrant pain, evaluated by Dr. Victorio Palm, colorectal surgeon at [facility]. He underwent laparoscopic ileocecectomy on 09/13/2019 at [facility] by Dr. Victorio Palm, and ileostomy takedown on 12/19/2019 and he recovered well from the surgery.   He underwent image guided cryoablation of right renal mass on 06/07/2020.  With regards to his Crohn's disease, unfortunately, patient could not afford high co-pay for Humira.  We tried [facility], he is not able to afford $1100 infusion  charge for entyvio as well.  Patient did not want to apply for patient assistance program because he thinks he will not qualify for it.  Patient is currently being maintained on budesonide 3 mg 2 to 3 pills daily, colestipol 2 pills daily.  He reports that he is having 1 formed bowel movement a day, sometimes a second BM depending on what he eats.  He denies any abdominal bloating.  He is gaining weight.  He denies any abdominal pain, does notice that his stools smell differently since surgery, he denies any rectal bleeding.  Patient does admit to drinking carbonated beverages, sugary drinks daily and he consumes red meat about twice a week.  Labs from 05/2020 revealed mild iron deficiency anemia.  Follow-up visit 01/07/2022 Patient is here for follow-up of Crohn's disease.  He has been doing well.  He is currently on budesonide 3 mg 1 pill daily and colestipol 1 pill a day.  Patient does not want to start any biologic therapy because of the cost.  He is not willing to apply for patient assistance program as well.  Patient reports mild chronic right lower quadrant discomfort which has been ongoing since his surgery.  He reports having 1-2 formed bowel movements daily, denies any rectal bleeding.  He denies any abdominal bloating.   NSAIDs: None  Antiplts/Anticoagulants/Anti thrombotics: None  GI Procedures:  Colonoscopy 03/20/2020 - Patent end-to-side ileo-colonic anastomosis, characterized by healthy appearing mucosa and an intact staple line.  Could not traverse the neoterminal ileum due to postsurgical anatomy despite several attempts - The entire examined colon is normal. - The distal rectum and anal verge are normal on retroflexion view.  Colonoscopy 03/2019 Terminal ileitis, normal colon  DIAGNOSIS:  A. TERMINAL ILEUM; BIOPSY:  - PATCHY ACTIVE ILEITIS.  - SEE COMMENT.   Comment:  The findings are nonspecific.  Diagnostic considerations would include  self-limited / infectious enteritis  / colitis, medication effects,  inflammatory bowel disease, etc.  Clinical correlation is recommended.  There is no evidence of dysplasia or malignancy.   B. COLON, RANDOM; BIOPSY:  - COLONIC MUCOSA WITH NO SIGNIFICANT PATHOLOGIC ALTERATION.  - NEGATIVE FOR MICROSCOPIC COLITIS, DYSPLASIA, AND MALIGNANCY.   Laparoscopic ileocecectomy 09/13/2019 Final Diagnosis    A: Terminal ileum and cecum, resection -Small intestine with chronic active ileitis, stricture, ulcerations, and diverticula (see comment) -Severe acute serositis with fibrinopurulent exudate -No granulomas identified -Appendix with fibrous obliteration  -Four lymph nodes, negative for malignancy (0/4) -Proximal margin appears histologically viable and is involved by mucosal ulceration -Distal margin appears histologically viable -No dysplasia or malignancy   The purpose of this addendum is to report the results of additional special stains.  An EBV ISH is negative.  A GMS stain is negative. A CMV stain was performed and is negative. There are no changes to the original diagnosis.  Past Medical History:  Diagnosis Date   Abdominal pain 02/15/2019   Allergy    Seasonal   Anemia    Arthritis    Basal cell carcinoma    Removed 1980's, renal   Cataracts, both eyes    Chronic kidney disease    Crohn's colitis, with abscess ([place]) 08/2019   Deaf, left    Diarrhea 02/21/2019   Diverticulitis    GERD (gastroesophageal reflux disease)    Hypokalemia 02/21/2019   IBS (irritable bowel syndrome)    Peptic ulcer 76 years old   Scarlet fever 76 years old   Shortness of breath dyspnea 1992   with exertion due to chemical at work   Wears dentures    full upper   Wears hearing aid in right ear     Past Surgical History:  Procedure Laterality Date   CATARACT EXTRACTION W/ INTRAOCULAR LENS  IMPLANT, BILATERAL     CHOLECYSTECTOMY N/A 03/30/2015   Procedure: LAPAROSCOPIC CHOLECYSTECTOMY;  Surgeon: Hubbard Robinson, MD;   Location: [facility];  Service: General;  Laterality: N/A;   COLON SURGERY  09/13/2019   .ileostomy. Pt reports complications   COLONOSCOPY WITH PROPOFOL N/A 04/15/2019   Procedure: COLONOSCOPY WITH BIOPSY AND DILATION;  Surgeon: [name], MD;  Location: [place];  Service: Endoscopy;  Laterality: N/A;  Priority 3   COLONOSCOPY WITH PROPOFOL N/A 03/20/2020   Procedure: COLONOSCOPY WITH PROPOFOL;  Surgeon: Lin Landsman, MD;  Location: [facility] END[facility]COPY;  Service: Gastroenterology;  Laterality: N/A;   EYE SURGERY     HERNIA REPAIR Bilateral 76 years old   Inguinal Hernia   IR RADIOLOGIST EVAL & MGMT  04/24/2020   IR RADIOLOGIST EVAL & MGMT  07/03/2020   IR RADIOLOGIST EVAL & MGMT  10/03/2020   IR RADIOLOGIST EVAL & MGMT  04/16/2021   lasik     RADIOLOGY WITH ANESTHESIA Right 06/06/2020   Procedure: RADIOLOGY WITH ANESTHESIA  CRYOABLATION OF RIGHT RENAL MASS;  Surgeon: Arne Cleveland, MD;  Location: [facility];  Service: Radiology;  Laterality: Right;   reanastomosis of ileostomy  2021    Current Outpatient Medications:    acetaminophen (TYLENOL) 500 MG tablet, Take 500 mg by mouth every 8 (eight) hours as needed for moderate pain., Disp: , Rfl:    budesonide (ENTOCORT EC) 3 MG 24 hr capsule, TAKE 3 CAPSULES (9 MG  TOTAL) BY MOUTH DAILY., Disp: 90 capsule, Rfl: 1   cetirizine (ZYRTEC) 10 MG tablet, Take 10 mg by mouth daily., Disp: , Rfl:    colestipol (COLESTID) 1 g tablet, Take 2 tablets (2 g total) by mouth 2 (two) times daily., Disp: 360 tablet, Rfl: 1   ibuprofen (ADVIL) 200 MG tablet, Take 200 mg by mouth every 8 (eight) hours as needed for moderate pain., Disp: , Rfl:    Family History  Problem Relation Age of Onset   Hypertension Mother    Diabetes Mother    Pancreatitis Mother    Heart disease Mother    Heart attack Father    COPD Father    Heart disease Father      Social History   Tobacco Use   Smoking status: Former    Packs/day: 1.00    Types: Cigarettes     Quit date: 02/28/1985    Years since quitting: 36.8    Passive exposure: Past   Smokeless tobacco: Never  Vaping Use   Vaping Use: Never used  Substance Use Topics   Alcohol use: Yes    Comment: rare   Drug use: No    Allergies as of 01/07/2022 - Review Complete 10/17/2021  Allergen Reaction Noted   Penicillins Rash 12/05/2013    Review of Systems:    All systems reviewed and negative except where noted in HPI.   Physical Exam:  BP (!) 180/92 (BP Location: Left Arm, Patient Position: Sitting, Cuff Size: Normal)   Pulse 75   Temp 98 F (36.7 C) (Oral)   Ht 5' 10"  (1.778 m)   Wt 165 lb 6 oz (75 kg)   BMI 23.73 kg/m  No LMP for male patient.  General:   Alert,  Well-developed, well-nourished, pleasant and cooperative in NAD Head:  Normocephalic and atraumatic. Eyes:  Sclera clear, no icterus.   Conjunctiva pink. Ears:  Normal auditory acuity. Nose:  No deformity, discharge, or lesions. Mouth:  No deformity or lesions,oropharynx pink & moist. Neck:  Supple; no masses or thyromegaly. Lungs:  Respirations even and unlabored.  Clear throughout to auscultation.   No wheezes, crackles, or rhonchi. No acute distress. Heart:  Regular rate and rhythm; no murmurs, clicks, rubs, or gallops. Abdomen:  Normal bowel sounds. Soft, non-tender and non-distended without masses, hepatosplenomegaly or hernias noted.  No guarding or rebound tenderness.  Well-healed scar from recent surgery Rectal: Not performed Msk:  Symmetrical without gross deformities. Good, equal movement & strength bilaterally. Pulses:  Normal pulses noted. Extremities:  No clubbing or edema.  No cyanosis. Neurologic:  Alert and oriented x3;  grossly normal neurologically. Skin:  Intact without significant lesions or rashes. No jaundice. Psych:  Alert and cooperative. Normal mood and affect.  Imaging Studies: Reviewed  Assessment and Plan:   CONTRELL BALLENTINE is a 76 y.o. male with history of acute inflammatory  ileitis, ileal diverticulosis, probable diverticulitis with perforation, abscess s/p drainage, colonoscopy revealed active terminal ileitis with no evidence of chronicity on histology.   MR enterography revealed pseudo sacculation, mural stratification involving the distal ileum as well as associated stricture without significant upstream bowel obstruction or current evidence of penetrating disease. Patient developed recurrence of terminal ileitis in 7/21, underwent laparoscopic ileocecectomy on 09/13/2019, 35 cm of terminal ileum was removed with 5 cm of cecum, s/p temporary ileostomy followed by closure in 11/2019.  Pathology confirmed Crohn's disease  Crohn's disease of the terminal ileum, stricturing and penetrating phenotype s/p ileocecectomy and ileostomy on  09/13/2019, s/p takedown in 11/2019 No obvious evidence of postop recurrence of Crohn's disease based on colonoscopy in 02/2020.  However, I could not traverse into neoterminal ileum due to postsurgical anatomy Patient is currently on colestipol and budesonide 3 mg 1 pill daily, in clinical remission Patient could not afford high co-pay for Humira as well as vedolizumab Patient is not willing to try any biologic medication at this time because he is feeling well Calprotectin levels 6 months ago were normal Recheck fecal calprotectin levels Patient is immune to hepatitis A, hepatitis B negative, HCV negative, Quant Far[place] gold negative  Iron deficiency anemia: Resolved   Follow up annually, or as needed   Todd Darby, MD

## 2022-01-09 ENCOUNTER — Other Ambulatory Visit: Payer: Self-pay | Admitting: Gastroenterology

## 2022-05-12 ENCOUNTER — Other Ambulatory Visit: Payer: Self-pay | Admitting: Interventional Radiology

## 2022-05-12 DIAGNOSIS — N2889 Other specified disorders of kidney and ureter: Secondary | ICD-10-CM

## 2022-05-14 ENCOUNTER — Other Ambulatory Visit: Payer: Self-pay | Admitting: Interventional Radiology

## 2022-05-14 DIAGNOSIS — N2889 Other specified disorders of kidney and ureter: Secondary | ICD-10-CM

## 2022-05-19 ENCOUNTER — Other Ambulatory Visit: Payer: Self-pay | Admitting: Gastroenterology

## 2022-05-19 DIAGNOSIS — K50012 Crohn's disease of small intestine with intestinal obstruction: Secondary | ICD-10-CM

## 2022-05-19 DIAGNOSIS — D509 Iron deficiency anemia, unspecified: Secondary | ICD-10-CM

## 2022-05-21 ENCOUNTER — Other Ambulatory Visit: Payer: Self-pay | Admitting: Gastroenterology

## 2022-05-27 ENCOUNTER — Ambulatory Visit
Admission: RE | Admit: 2022-05-27 | Discharge: 2022-05-27 | Disposition: A | Discharge status: Home / Self Care | Payer: Medicare HMO | Source: Ambulatory Visit | Attending: Interventional Radiology | Admitting: Interventional Radiology

## 2022-05-27 DIAGNOSIS — N2889 Other specified disorders of kidney and ureter: Secondary | ICD-10-CM

## 2022-05-27 MED ORDER — GADOBUTROL 1 MMOL/ML IV SOLN
7.5000 mL | Freq: Once | INTRAVENOUS | Status: AC | PRN
  Administered 2022-05-27: 7.5 mL via INTRAVENOUS

## 2022-06-04 ENCOUNTER — Ambulatory Visit
Admission: RE | Admit: 2022-06-04 | Discharge: 2022-06-04 | Disposition: A | Discharge status: Home / Self Care | Payer: Medicare HMO | Source: Ambulatory Visit | Attending: Interventional Radiology | Admitting: Interventional Radiology

## 2022-06-04 DIAGNOSIS — N2889 Other specified disorders of kidney and ureter: Secondary | ICD-10-CM

## 2022-06-04 HISTORY — PX: IR RADIOLOGIST EVAL & MGMT: IMG5224

## 2022-06-04 NOTE — Progress Notes (Signed)
Chief Complaint: Renal Malignancy s/p ablation  Referring Physician(s): Sondra Come   History of Present Illness: Todd Briggs is a 77 y.o. male with history of irritable bowel syndrome, who presented on 02/15/2019 with abdominal pain, CT demonstrated inflammatory ileitis without abscess, and incidental note made of a 2 cm right renal lesion.   02/16/2019:  MR confirms 2.1 cm right lower renal mass compatible with solid renal neoplasm presumably renal cell carcinoma.   04/05/2020: CT showed interval enlargement of the right lower pole renal mass now 2.5 x 2.6 cm.  No renal vein involvement or regional adenopathy.   06/06/2020: Underwent CT-guided cryoablation of the right renal lesion.      07/03/2020: On follow-up patient described some right flank numbness post procedure, otherwise doing well.  10/03/2020: On follow-up he described persistent numbness in the right flank, but overall doing well.   04/16/2021: MRI on 04/12/2021 showed decrease in size of ablated right renal mass with no evidence of reoccurrence.   06/03/2021: Most recent MRI from 05/26/2021 shows new progressive nodular enhancement in the inferior portion of the ablation zone, suspicious for tumor reoccurrence.  He returns today for clinic visit.  He is doing well and denies hematuria, weight loss, and flank pain.  Past Medical History:  Diagnosis Date   Abdominal pain 02/15/2019   Allergy    Seasonal   Anemia    Arthritis    Basal cell carcinoma    Removed 1980's, renal   Cataracts, both eyes    Chronic kidney disease    Crohn's colitis, with abscess (HCC) 08/2019   Deaf, left    Diarrhea 02/21/2019   Diverticulitis    GERD (gastroesophageal reflux disease)    Hypokalemia 02/21/2019   IBS (irritable bowel syndrome)    Peptic ulcer 78 years old   Scarlet fever 77 years old   Shortness of breath dyspnea 1992   with exertion due to chemical at work   Wears dentures    full upper   Wears hearing  aid in right ear     Past Surgical History:  Procedure Laterality Date   CATARACT EXTRACTION W/ INTRAOCULAR LENS  IMPLANT, BILATERAL     CHOLECYSTECTOMY N/A 03/30/2015   Procedure: LAPAROSCOPIC CHOLECYSTECTOMY;  Surgeon: Gladis Riffle, MD;  Location: [facility];  Service: General;  Laterality: N/A;   COLON SURGERY  09/13/2019   .ileostomy. Pt reports complications   COLONOSCOPY WITH PROPOFOL N/A 04/15/2019   Procedure: COLONOSCOPY WITH BIOPSY AND DILATION;  Surgeon: Midge Minium, MD;  Location: [facility];  Service: Endoscopy;  Laterality: N/A;  Priority 3   COLONOSCOPY WITH PROPOFOL N/A 03/20/2020   Procedure: COLONOSCOPY WITH PROPOFOL;  Surgeon: Toney Reil, MD;  Location: [facility] END[facility];  Service: Gastroenterology;  Laterality: N/A;   EYE SURGERY     HERNIA REPAIR Bilateral 77 years old   Inguinal Hernia   IR RADIOLOGIST EVAL & MGMT  04/24/2020   IR RADIOLOGIST EVAL & MGMT  07/03/2020   IR RADIOLOGIST EVAL & MGMT  10/03/2020   IR RADIOLOGIST EVAL & MGMT  04/16/2021   lasik     RADIOLOGY WITH ANESTHESIA Right 06/06/2020   Procedure: RADIOLOGY WITH ANESTHESIA  CRYOABLATION OF RIGHT RENAL MASS;  Surgeon: Oley Balm, MD;  Location: [facility];  Service: Radiology;  Laterality: Right;   reanastomosis of ileostomy  2021    Allergies: Penicillins  Medications: Prior to Admission medications   Medication Sig Start Date End Date Taking? Authorizing Provider  acetaminophen (TYLENOL) 500 MG tablet Take 500 mg by mouth every 8 (eight) hours as needed for moderate pain.    [provider]  budesonide (ENTOCORT EC) 3 MG 24 hr capsule TAKE 3 CAPSULES (9 MG TOTAL) BY MOUTH DAILY. 05/21/22   Toney Reil, MD  cetirizine (ZYRTEC) 10 MG tablet Take 10 mg by mouth daily.    [provider]  colestipol (COLESTID) 1 g tablet TAKE 2 TABLETS BY MOUTH 2 TIMES DAILY. 05/19/22   Toney Reil, MD  ibuprofen (ADVIL) 200 MG tablet Take 200 mg by mouth every 8  (eight) hours as needed for moderate pain.    [provider]     Family History  Problem Relation Age of Onset   Hypertension Mother    Diabetes Mother    Pancreatitis Mother    Heart disease Mother    Heart attack Father    COPD Father    Heart disease Father     Social History   Socioeconomic History   Marital status: Married    Spouse name: Not on file   Number of children: Not on file   Years of education: Not on file   Highest education level: Not on file  Occupational History   Not on file  Tobacco Use   Smoking status: Former    Packs/day: 1    Types: Cigarettes    Quit date: 02/28/1985    Years since quitting: 37.2    Passive exposure: Past   Smokeless tobacco: Never  Vaping Use   Vaping Use: Never used  Substance and Sexual Activity   Alcohol use: Yes    Comment: rare   Drug use: No   Sexual activity: Yes    Birth control/protection: None  Other Topics Concern   Not on file  Social History Narrative   Not on file   Social Determinants of Health   Financial Resource Strain: Not on file  Food Insecurity: Not on file  Transportation Needs: Not on file  Physical Activity: Not on file  Stress: Not on file  Social Connections: Not on file   Review of Systems  Review of Systems: A 12 point ROS discussed and pertinent positives are indicated in the HPI above.  All other systems are negative.  Advance Care Plan: The advanced care plan/surrogate decision maker was discussed at the time of visit and the patient did not wish to discuss or was not able to name a surrogate decision maker or provide an advance care plan.   Physical Exam No direct physical exam was performed (except for noted visual exam findings with Video Visits).    Vital Signs: There were no vitals taken for this visit.  Imaging: MR ABDOMEN W WO CONTRAST  Result Date: 05/27/2022 CLINICAL DATA:  Right renal cell carcinoma cryoablated on 06/06/2020. History of Crohn's disease.  EXAM: MRI ABDOMEN WITHOUT AND WITH CONTRAST TECHNIQUE: Multiplanar multisequence MR imaging of the abdomen was performed both before and after the administration of intravenous contrast. CONTRAST:  7.65mL GADAVIST GADOBUTROL 1 MMOL/ML IV SOLN COMPARISON:  Multiple exams, including 04/12/2021 FINDINGS: Lower chest: Unremarkable Hepatobiliary: Three simple cysts in the liver are clinically inconsequential, requiring no follow up. Gallbladder absent. No biliary dilatation. No additional hepatic lesion observed. Pancreas:  Unremarkable Spleen:  Unremarkable Adrenals/Urinary Tract: Adrenal glands appear normal. The left kidney appears unremarkable. Complex right kidney lower pole lesion compatible with ablation site and periablation halo in the perirenal adipose tissues. This lesion has some new  progressive nodular enhancement within its inferior portion, measuring about 1.1 by 0.7 by 1.4 cm on image 51 series 30. Although indistinctly marginated, this nodularity appears to be progressive, faintly visible on the late arterial phase images but becoming more solid and nodular appearing on the delayed images. I am skeptical that this represents excreted contrast medium in a calyceal diverticulum although that would be a possibility. Early recurrent tumor is a distinct possibility given the new nodular enhancement. However, at this time this nodularity is indistinctly marginated. No other significant renal lesion. Stomach/Bowel: Prior ileocecectomy. There is some wall thickening and probably enhancement in the remaining portions of the ascending and proximal transverse colon, for example on image 17 series 3 Vascular/Lymphatic: Infrarenal duplicated IVC. Mild abdominal aortic atherosclerotic vascular calcification. Other:  No supplemental non-categorized findings. Musculoskeletal: Lower lumbar spondylosis and degenerative disc disease with type 2 degenerative endplate findings at L4-5 and L5-S1. IMPRESSION: 1. New poorly  marginated nodular enhancement along the inferior portion of the previously ablated right kidney lower pole lesion, suspicious for an early sign of local recurrence. 2. There is some wall thickening and probably enhancement in the remaining portions of the ascending and proximal transverse colon, likely related to inflammatory bowel disease. Prior ileocecectomy. 3. Infrarenal duplicated IVC. 4. 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 Electronically Signed   By: Gaylyn RongWalter  Liebkemann M.D.   On: 05/27/2022 13:08    Labs:  CBC: Recent Labs    06/18/21 1516  WBC 8.7  HGB 13.1  HCT 39.7  PLT 251    COAGS: No results for input(s): "INR", "APTT" in the last 8760 hours.  BMP: Recent Labs    06/18/21 1516 10/10/21 1353  NA 145*  --   K 3.3*  --   CL 105  --   CO2 26  --   GLUCOSE 96  --   BUN 17  --   CALCIUM 8.9  --   CREATININE 1.22 1.40*    LIVER FUNCTION TESTS: Recent Labs    06/18/21 1516  BILITOT 0.4  AST 19  ALT 17  ALKPHOS 73  PROT 6.3  ALBUMIN 4.1    TUMOR MARKERS: No results for input(s): "AFPTM", "CEA", "CA199", "CHROMGRNA" in the last 8760 hours.  Assessment and Plan:  77 year old gentleman 2 year s/p cryoablation of right renal tumor returns to IR clinic for follow up.  He is doing well.  Most recent MRI from 05/27/2022 shows new area of enhancement in the inferior aspect of the ablation zone, suspicious for early local reoccurrence.    The region of enhancement is somewhat linear and does not show a well formed mass at this time.  Given this, I want to perform a 3 month follow up MRI before repeating an ablation.  I discussed the findings and recommendations with the patient.  He understood and did not have any additional questions.  Plan: - MRI abdomen in 3 months - Follow up clinic appointment after MRI  Thank you for this interesting consult.  I greatly enjoyed meeting Todd Briggs and look forward to participating in their care.  A copy  of this report was sent to the requesting provider on this date.  Electronically Signed: Al CorpusFarhaan R Aysha Livecchi 06/04/2022, 2:51 PM   I spent a total of    15 Minutes in remote  clinical consultation, greater than 50% of which was counseling/coordinating care for renal malignancy.    Visit type: Audio only (telephone). Audio (no video) only due  to technical limitations. Alternative for in-person consultation at [facility], [address] [place], [place], [place]. This visit type was conducted due to national recommendations for restrictions regarding the COVID-19 Pandemic (e.g. social distancing).  This format is felt to be most appropriate for this patient at this time.  All issues noted in this document were discussed and addressed.

## 2022-06-05 ENCOUNTER — Other Ambulatory Visit: Payer: Medicare HMO

## 2022-07-15 ENCOUNTER — Other Ambulatory Visit: Payer: Self-pay | Admitting: Gastroenterology

## 2022-09-17 ENCOUNTER — Other Ambulatory Visit: Payer: Self-pay | Admitting: Interventional Radiology

## 2022-09-17 DIAGNOSIS — N2889 Other specified disorders of kidney and ureter: Secondary | ICD-10-CM

## 2022-09-23 ENCOUNTER — Ambulatory Visit: Payer: Medicare HMO

## 2022-09-26 ENCOUNTER — Telehealth: Payer: Medicare HMO

## 2022-10-09 ENCOUNTER — Ambulatory Visit
Admission: RE | Admit: 2022-10-09 | Discharge: 2022-10-09 | Disposition: A | Discharge status: Home / Self Care | Payer: Medicare HMO | Source: Ambulatory Visit | Attending: Interventional Radiology | Admitting: Interventional Radiology

## 2022-10-09 DIAGNOSIS — N2889 Other specified disorders of kidney and ureter: Secondary | ICD-10-CM

## 2022-10-09 MED ORDER — GADOPICLENOL 0.5 MMOL/ML IV SOLN
7.5000 mL | Freq: Once | INTRAVENOUS | Status: AC | PRN
  Administered 2022-10-09: 7 mL via INTRAVENOUS

## 2022-10-13 ENCOUNTER — Ambulatory Visit
Admission: RE | Admit: 2022-10-13 | Discharge: 2022-10-13 | Disposition: A | Discharge status: Home / Self Care | Payer: Medicare HMO | Source: Ambulatory Visit | Attending: Interventional Radiology | Admitting: Interventional Radiology

## 2022-10-13 DIAGNOSIS — N2889 Other specified disorders of kidney and ureter: Secondary | ICD-10-CM

## 2022-10-13 HISTORY — PX: IR RADIOLOGIST EVAL & MGMT: IMG5224

## 2022-10-13 NOTE — Progress Notes (Signed)
Chief Complaint: Renal Malignancy s/p ablation   Referring Physician(s): Sondra Come    History of Present Illness: Todd Briggs is a 77 y.o. male with history of irritable bowel syndrome, who presented on 02/15/2019 with abdominal pain. CT demonstrated inflammatory ileitis without abscess, and incidental note made of a 2 cm right renal lesion.   02/16/2019:  MR confirms 2.1 cm right lower renal mass compatible with solid renal neoplasm presumably renal cell carcinoma.   04/05/2020: CT showed interval enlargement of the right lower pole renal mass now 2.5 x 2.6 cm.  No renal vein involvement or regional adenopathy.   06/06/2020: Underwent CT-guided cryoablation of the right renal lesion.      07/03/2020: On follow-up patient described some right flank numbness post procedure, otherwise doing well.  10/03/2020: On follow-up he described persistent numbness in the right flank, but overall doing well.   04/16/2021: MRI on 04/12/2021 showed decrease in size of ablated right renal mass with no evidence of reoccurrence.   06/03/2021: MRI showed new progressive nodular enhancement in the inferior portion of the ablation zone, suspicious for tumor reoccurrence.  10/13/2022: Most recent MRI from 10/09/2022 shows persistent nodular enhancement in the inferior ablation zone.  The degree and extent of enhancement is unchanged.  No new renal lesions were seen.  He is doing well fairly well.  He denies flank pain and hematuria.  Past Medical History:  Diagnosis Date   Abdominal pain 02/15/2019   Allergy    Seasonal   Anemia    Arthritis    Basal cell carcinoma    Removed 1980's, renal   Cataracts, both eyes    Chronic kidney disease    Crohn's colitis, with abscess (HCC) 08/2019   Deaf, left    Diarrhea 02/21/2019   Diverticulitis    GERD (gastroesophageal reflux disease)    Hypokalemia 02/21/2019   IBS (irritable bowel syndrome)    Peptic ulcer 77 years old   Scarlet fever 77  years old   Shortness of breath dyspnea 1992   with exertion due to chemical at work   Wears dentures    full upper   Wears hearing aid in right ear     Past Surgical History:  Procedure Laterality Date   CATARACT EXTRACTION W/ INTRAOCULAR LENS  IMPLANT, BILATERAL     CHOLECYSTECTOMY N/A 03/30/2015   Procedure: LAPAROSCOPIC CHOLECYSTECTOMY;  Surgeon: Gladis Riffle, MD;  Location: [facility] ORS;  Service: General;  Laterality: N/A;   COLON SURGERY  09/13/2019   .ileostomy. Pt reports complications   COLONOSCOPY WITH PROPOFOL N/A 04/15/2019   Procedure: COLONOSCOPY WITH BIOPSY AND DILATION;  Surgeon: Midge Minium, MD;  Location: [facility];  Service: Endoscopy;  Laterality: N/A;  Priority 3   COLONOSCOPY WITH PROPOFOL N/A 03/20/2020   Procedure: COLONOSCOPY WITH PROPOFOL;  Surgeon: Toney Reil, MD;  Location: [facility] ENDOSCOPY;  Service: Gastroenterology;  Laterality: N/A;   EYE SURGERY     HERNIA REPAIR Bilateral 77 years old   Inguinal Hernia   IR RADIOLOGIST EVAL & MGMT  04/24/2020   IR RADIOLOGIST EVAL & MGMT  07/03/2020   IR RADIOLOGIST EVAL & MGMT  10/03/2020   IR RADIOLOGIST EVAL & MGMT  04/16/2021   IR RADIOLOGIST EVAL & MGMT  06/04/2022   lasik     RADIOLOGY WITH ANESTHESIA Right 06/06/2020   Procedure: RADIOLOGY WITH ANESTHESIA  CRYOABLATION OF RIGHT RENAL MASS;  Surgeon: Oley Balm, MD;  Location: [facility];  Service: Radiology;  Laterality: Right;   reanastomosis of ileostomy  2021    Allergies: Penicillins  Medications: Prior to Admission medications   Medication Sig Start Date End Date Taking? Authorizing Provider  acetaminophen (TYLENOL) 500 MG tablet Take 500 mg by mouth every 8 (eight) hours as needed for moderate pain.    [provider]  budesonide (ENTOCORT EC) 3 MG 24 hr capsule TAKE 3 CAPSULES (9 MG TOTAL) BY MOUTH DAILY. 07/15/22   Toney Reil, MD  cetirizine (ZYRTEC) 10 MG tablet Take 10 mg by mouth daily.    [provider]   colestipol (COLESTID) 1 g tablet TAKE 2 TABLETS BY MOUTH 2 TIMES DAILY. 05/19/22   Toney Reil, MD  ibuprofen (ADVIL) 200 MG tablet Take 200 mg by mouth every 8 (eight) hours as needed for moderate pain.    [provider]     Family History  Problem Relation Age of Onset   Hypertension Mother    Diabetes Mother    Pancreatitis Mother    Heart disease Mother    Heart attack Father    COPD Father    Heart disease Father     Social History   Socioeconomic History   Marital status: Married    Spouse name: Not on file   Number of children: Not on file   Years of education: Not on file   Highest education level: Not on file  Occupational History   Not on file  Tobacco Use   Smoking status: Former    Current packs/day: 0.00    Types: Cigarettes    Quit date: 02/28/1985    Years since quitting: 37.6    Passive exposure: Past   Smokeless tobacco: Never  Vaping Use   Vaping status: Never Used  Substance and Sexual Activity   Alcohol use: Yes    Comment: rare   Drug use: No   Sexual activity: Yes    Birth control/protection: None  Other Topics Concern   Not on file  Social History Narrative   Not on file   Social Determinants of Health   Financial Resource Strain: Low Risk  (09/12/2019)   Received from [facility], [facility]   Overall Financial Resource Strain (CARDIA)    Difficulty of Paying Living Expenses: Not very hard  Food Insecurity: No Food Insecurity (09/12/2019)   Received from [facility], [facility]   Hunger Vital Sign    Worried About Running Out of Food in the Last Year: Never true    Ran Out of Food in the Last Year: Never true  Transportation Needs: No Transportation Needs (09/12/2019)   Received from [facility], [facility]   PRAPARE - Transportation    Lack of Transportation (Medical): No    Lack of Transportation (Non-Medical): No  Physical Activity: Not on file  Stress: Not on file  Social Connections:  Not on file   Review of Systems  Review of Systems: A 12 point ROS discussed and pertinent positives are indicated in the HPI above.  All other systems are negative.  Advance Care Plan: The advanced care plan/surrogate decision maker was discussed at the time of visit and the patient did not wish to discuss or was not able to name a surrogate decision maker or provide an advance care plan.   Physical Exam No direct physical exam was performed (except for noted visual exam findings with Video Visits).    Vital Signs: There were no vitals  taken for this visit.  Imaging: MR ABDOMEN WWO CONTRAST  Result Date: 10/13/2022 CLINICAL DATA:  Follow-up right renal cell carcinoma. Previous cryoablation in 2022. Crohn disease. EXAM: MRI ABDOMEN WITHOUT AND WITH CONTRAST TECHNIQUE: Multiplanar multisequence MR imaging of the abdomen was performed both before and after the administration of intravenous contrast. CONTRAST:  7 mL Vueway COMPARISON:  05/27/2022 and 04/12/2021 FINDINGS: Lower chest: No acute findings. Hepatobiliary: No hepatic masses identified. Prior cholecystectomy. No evidence of biliary obstruction. Pancreas:  No mass or inflammatory changes. Spleen:  Within normal limits in size and appearance. Adrenals/Urinary Tract: Normal appearance of left kidney. Cryoablation defect again seen in the posterior lower pole of the right kidney. This remains stable compared to prior CT 4 months ago, with persistent small central area of nodular contrast enhancement measuring 5 x 9 mm (see image 54/15). Although stable, early local recurrence cannot definitely be excluded. Stomach/Bowel: Unremarkable. Vascular/Lymphatic: No pathologically enlarged lymph nodes identified. Congenital duplication of IVC again noted. No acute vascular findings. Other:  None. Musculoskeletal:  No suspicious bone lesions identified. IMPRESSION: Stable appearance of cryoablation defect in the posterior lower pole of the right kidney,  with persistent small nodular area of contrast enhancement. Although stable, early local recurrence cannot definitely be excluded. No evidence of abdominal metastatic disease. Electronically Signed   By: Danae Orleans M.D.   On: 10/13/2022 09:58    Labs:  CBC: No results for input(s): "WBC", "HGB", "HCT", "PLT" in the last 8760 hours.  COAGS: No results for input(s): "INR", "APTT" in the last 8760 hours.  BMP: No results for input(s): "NA", "K", "CL", "CO2", "GLUCOSE", "BUN", "CALCIUM", "CREATININE", "GFRNONAA", "GFRAA" in the last 8760 hours.  Invalid input(s): "CMP"  LIVER FUNCTION TESTS: No results for input(s): "BILITOT", "AST", "ALT", "ALKPHOS", "PROT", "ALBUMIN" in the last 8760 hours.  TUMOR MARKERS: No results for input(s): "AFPTM", "CEA", "CA199", "CHROMGRNA" in the last 8760 hours.  Assessment and Plan:  Assessment and Plan:   76 year old gentleman s/p cryoablation of right renal tumor returns to IR clinic for follow up.  He is doing well.  Most recent MRI from 10/09/2022 shows unchanged area of enhancement in the inferior aspect of the ablation zone, possibly related to reoccurrence.  No new renal lesions were seen.   Given that the region of enhancement is unchanged, we will not proceed with repeat ablation at this time.  We will plan on performing a MRI in April, 2025.   Plan: - MRI abdomen in April, 2025 - Follow up clinic appointment after MRI  Thank you for this interesting consult.  I greatly enjoyed meeting HAYYAN ELGART and look forward to participating in their care.  A copy of this report was sent to the requesting provider on this date.  Electronically Signed: Al Corpus Camilia Caywood 10/13/2022, 10:16 AM   I spent a total of    15 Minutes in remote  clinical consultation, greater than 50% of which was counseling/coordinating care for renal malignancy.    Visit type: Audio only (telephone). Audio (no video) only due to technical limitations. Alternative for  in-person consultation at [facility], [address] [place], [place], [place]. This visit type was conducted due to national recommendations for restrictions regarding the COVID-19 Pandemic (e.g. social distancing).  This format is felt to be most appropriate for this patient at this time.  All issues noted in this document were discussed and addressed.

## 2022-11-19 ENCOUNTER — Other Ambulatory Visit: Payer: Self-pay | Admitting: Gastroenterology

## 2022-11-19 DIAGNOSIS — D509 Iron deficiency anemia, unspecified: Secondary | ICD-10-CM

## 2022-11-19 DIAGNOSIS — K50012 Crohn's disease of small intestine with intestinal obstruction: Secondary | ICD-10-CM

## 2023-01-06 ENCOUNTER — Telehealth: Payer: Self-pay | Admitting: Gastroenterology

## 2023-01-06 NOTE — Telephone Encounter (Signed)
Return patient call and patient is just needing to make appointment made appointment for 03/10/2023

## 2023-01-06 NOTE — Telephone Encounter (Signed)
Pt requesting call back returned your call

## 2023-02-19 ENCOUNTER — Other Ambulatory Visit: Payer: Self-pay | Admitting: Gastroenterology

## 2023-02-19 DIAGNOSIS — D509 Iron deficiency anemia, unspecified: Secondary | ICD-10-CM

## 2023-02-19 DIAGNOSIS — K50012 Crohn's disease of small intestine with intestinal obstruction: Secondary | ICD-10-CM

## 2023-03-10 ENCOUNTER — Encounter: Payer: Self-pay | Admitting: Gastroenterology

## 2023-03-10 ENCOUNTER — Ambulatory Visit (INDEPENDENT_AMBULATORY_CARE_PROVIDER_SITE_OTHER): Payer: Medicare HMO | Admitting: Gastroenterology

## 2023-03-10 VITALS — BP 186/92 | HR 76 | Temp 97.5°F | Ht 70.0 in | Wt 161.1 lb

## 2023-03-10 DIAGNOSIS — K50012 Crohn's disease of small intestine with intestinal obstruction: Secondary | ICD-10-CM

## 2023-03-10 DIAGNOSIS — R1031 Right lower quadrant pain: Secondary | ICD-10-CM

## 2023-03-10 NOTE — Progress Notes (Signed)
 Corinn JONELLE Brooklyn, MD [address]  [address]  [place], [place] [address]  Main: [phone]  Fax: [phone]    Gastroenterology Consultation  Referring Provider:     Jeffie Cheryl BRAVO, MD Primary Care Physician:  Jeffie Cheryl BRAVO, MD Primary Gastroenterologist:  Dr. Corinn Brooklyn Reason for Consultation:  small bowel Crohn's        HPI:   Todd Briggs is a 78 y.o. male referred by Dr. Jeffie Cheryl BRAVO, MD  for consultation & management of small bowel Crohn's.  Patient was originally admitted on 02/15/2019 secondary to right lower abdominal pain associated with nonbloody loose bowel movements. Patient underwent CT abdomen which revealed fairly extensive inflammatory ileitis with mucosal and serosal enhancement, submucosal edema, multiple ileal diverticulosis.  Patient was treated with antibiotics during that admission and was discharged home.  He was readmitted on 02/21/2019 due to ongoing symptoms, repeat CT revealed multiple air-fluid collections, intercommunicating, likely related to rupture of the small ileal diverticulum.  Patient underwent CT-guided drainage of the abscess, drain placement, antibiotics.  He subsequently underwent outpatient colonoscopy after healing of the abscess in 03/2019 which revealed terminal ileitis and normal colon.  Biopsies confirmed active patchy ileitis with no evidence of chronicity.  Random colon biopsies were unremarkable.  Patient developed recurrence of abscess with worsening of right lower quadrant pain, evaluated by Dr. Miguel, colorectal surgeon at [facility]. He underwent laparoscopic ileocecectomy on 09/13/2019 at [facility] by Dr. Miguel, and ileostomy takedown on 12/19/2019 and he recovered well from the surgery.   He underwent image guided cryoablation of right renal mass on 06/07/2020.  With regards to his Crohn's disease, unfortunately, patient could not afford high co-pay for Humira.  We tried Entyvio, he is not able to afford $1100 infusion  charge for entyvio as well.  Patient did not want to apply for patient assistance program because he thinks he will not qualify for it.  Patient is currently being maintained on budesonide  3 mg 2 to 3 pills daily, colestipol  2 pills daily.  He reports that he is having 1 formed bowel movement a day, sometimes a second BM depending on what he eats.  He denies any abdominal bloating.  He is gaining weight.  He denies any abdominal pain, does notice that his stools smell differently since surgery, he denies any rectal bleeding.  Patient does admit to drinking carbonated beverages, sugary drinks daily and he consumes red meat about twice a week.  Labs from 05/2020 revealed mild iron deficiency anemia.  Follow-up visit 01/07/2022 Patient is here for follow-up of Crohn's disease.  He has been doing well.  He is currently on budesonide  3 mg 1 pill daily and colestipol  1 pill a day.  Patient does not want to start any biologic therapy because of the cost.  He is not willing to apply for patient assistance program as well.  Patient reports mild chronic right lower quadrant discomfort which has been ongoing since his surgery.  He reports having 1-2 formed bowel movements daily, denies any rectal bleeding.  He denies any abdominal bloating.  Follow-up visit 03/11/2023 Todd Briggs is here for follow-up of his Crohn's disease.  He reports that he has been experiencing right lower quadrant discomfort along with abdominal bloating.  He continues to take colestipol  as well as budesonide  and reports having 1 bowel movement daily.  His weight has been stable.  He denies any rectal bleeding, nausea or vomiting.  NSAIDs: None  Antiplts/Anticoagulants/Anti thrombotics: None  GI  Procedures:  Colonoscopy 03/20/2020 - Patent end-to-side ileo-colonic anastomosis, characterized by healthy appearing mucosa and an intact staple line.  Could not traverse the neoterminal ileum due to postsurgical anatomy despite several attempts -  The entire examined colon is normal. - The distal rectum and anal verge are normal on retroflexion view.  Colonoscopy 03/2019 Terminal ileitis, normal colon  DIAGNOSIS:  A. TERMINAL ILEUM; BIOPSY:  - PATCHY ACTIVE ILEITIS.  - SEE COMMENT.   Comment:  The findings are nonspecific.  Diagnostic considerations would include  self-limited / infectious enteritis / colitis, medication effects,  inflammatory bowel disease, etc.  Clinical correlation is recommended.  There is no evidence of dysplasia or malignancy.   B. COLON, RANDOM; BIOPSY:  - COLONIC MUCOSA WITH NO SIGNIFICANT PATHOLOGIC ALTERATION.  - NEGATIVE FOR MICROSCOPIC COLITIS, DYSPLASIA, AND MALIGNANCY.   Laparoscopic ileocecectomy 09/13/2019 Final Diagnosis    A: Terminal ileum and cecum, resection -Small intestine with chronic active ileitis, stricture, ulcerations, and diverticula (see comment) -Severe acute serositis with fibrinopurulent exudate -No granulomas identified -Appendix with fibrous obliteration  -Four lymph nodes, negative for malignancy (0/4) -Proximal margin appears histologically viable and is involved by mucosal ulceration -Distal margin appears histologically viable -No dysplasia or malignancy   The purpose of this addendum is to report the results of additional special stains.  An EBV ISH is negative.  A GMS stain is negative. A CMV stain was performed and is negative. There are no changes to the original diagnosis.  Past Medical History:  Diagnosis Date   Abdominal pain 02/15/2019   Allergy    Seasonal   Anemia    Arthritis    Basal cell carcinoma    Removed 1980's, renal   Cataracts, both eyes    Chronic kidney disease    Crohn's colitis, with abscess (HCC) 08/2019   Deaf, left    Diarrhea 02/21/2019   Diverticulitis    GERD (gastroesophageal reflux disease)    Hypokalemia 02/21/2019   IBS (irritable bowel syndrome)    Peptic ulcer 78 years old   Scarlet fever 78 years old   Shortness of  breath dyspnea 1992   with exertion due to chemical at work   Wears dentures    full upper   Wears hearing aid in right ear     Past Surgical History:  Procedure Laterality Date   CATARACT EXTRACTION W/ INTRAOCULAR LENS  IMPLANT, BILATERAL     CHOLECYSTECTOMY N/A 03/30/2015   Procedure: LAPAROSCOPIC CHOLECYSTECTOMY;  Surgeon: Dorothyann LITTIE Husk, MD;  Location: [facility];  Service: General;  Laterality: N/A;   COLON SURGERY  09/13/2019   .ileostomy. Pt reports complications   COLONOSCOPY WITH PROPOFOL  N/A 04/15/2019   Procedure: COLONOSCOPY WITH BIOPSY AND DILATION;  Surgeon: Jinny Carmine, MD;  Location: [facility];  Service: Endoscopy;  Laterality: N/A;  Priority 3   COLONOSCOPY WITH PROPOFOL  N/A 03/20/2020   Procedure: COLONOSCOPY WITH PROPOFOL ;  Surgeon: Unk Corinn Skiff, MD;  Location: [facility];  Service: Gastroenterology;  Laterality: N/A;   EYE SURGERY     HERNIA REPAIR Bilateral 78 years old   Inguinal Hernia   IR RADIOLOGIST EVAL & MGMT  04/24/2020   IR RADIOLOGIST EVAL & MGMT  07/03/2020   IR RADIOLOGIST EVAL & MGMT  10/03/2020   IR RADIOLOGIST EVAL & MGMT  04/16/2021   IR RADIOLOGIST EVAL & MGMT  06/04/2022   IR RADIOLOGIST EVAL & MGMT  10/13/2022   lasik     RADIOLOGY WITH ANESTHESIA Right 06/06/2020  Procedure: RADIOLOGY WITH ANESTHESIA  CRYOABLATION OF RIGHT RENAL MASS;  Surgeon: Johann Sieving, MD;  Location: [facility];  Service: Radiology;  Laterality: Right;   reanastomosis of ileostomy  2021    Current Outpatient Medications:    acetaminophen  (TYLENOL ) 500 MG tablet, Take 500 mg by mouth every 8 (eight) hours as needed for moderate pain., Disp: , Rfl:    budesonide  (ENTOCORT EC ) 3 MG 24 hr capsule, TAKE 3 CAPSULES (9 MG TOTAL) BY MOUTH DAILY., Disp: 90 capsule, Rfl: 5   cetirizine (ZYRTEC) 10 MG tablet, Take 10 mg by mouth daily., Disp: , Rfl:    colestipol  (COLESTID ) 1 g tablet, TAKE 2 TABLETS BY MOUTH TWICE A DAY, Disp: 360 tablet, Rfl: 0   Family History   Problem Relation Age of Onset   Hypertension Mother    Diabetes Mother    Pancreatitis Mother    Heart disease Mother    Heart attack Father    COPD Father    Heart disease Father      Social History   Tobacco Use   Smoking status: Former    Current packs/day: 0.00    Types: Cigarettes    Quit date: 02/28/1985    Years since quitting: 38.0    Passive exposure: Past   Smokeless tobacco: Never  Vaping Use   Vaping status: Never Used  Substance Use Topics   Alcohol use: Yes    Comment: rare   Drug use: No    Allergies as of 03/10/2023 - Review Complete 03/10/2023  Allergen Reaction Noted   Azelastine Itching and Rash 08/01/2022   Penicillins Rash 12/05/2013    Review of Systems:    All systems reviewed and negative except where noted in HPI.   Physical Exam:  BP (!) 186/92 (BP Location: Right Arm, Patient Position: Sitting, Cuff Size: Normal)   Pulse 76   Temp (!) 97.5 F (36.4 C) (Oral)   Ht 5' 10 (1.778 m)   Wt 161 lb 2 oz (73.1 kg)   BMI 23.12 kg/m  No LMP for male patient.  General:   Alert,  Well-developed, well-nourished, pleasant and cooperative in NAD Head:  Normocephalic and atraumatic. Eyes:  Sclera clear, no icterus.   Conjunctiva pink. Ears:  Normal auditory acuity. Nose:  No deformity, discharge, or lesions. Mouth:  No deformity or lesions,oropharynx pink & moist. Neck:  Supple; no masses or thyromegaly. Lungs:  Respirations even and unlabored.  Clear throughout to auscultation.   No wheezes, crackles, or rhonchi. No acute distress. Heart:  Regular rate and rhythm; no murmurs, clicks, rubs, or gallops. Abdomen:  Normal bowel sounds. Soft, non-tender and non-distended without masses, hepatosplenomegaly or hernias noted.  No guarding or rebound tenderness.  Well-healed scar from recent surgery Rectal: Not performed Msk:  Symmetrical without gross deformities. Good, equal movement & strength bilaterally. Pulses:  Normal pulses noted. Extremities:   No clubbing or edema.  No cyanosis. Neurologic:  Alert and oriented x3;  grossly normal neurologically. Skin:  Intact without significant lesions or rashes. No jaundice. Psych:  Alert and cooperative. Normal mood and affect.  Imaging Studies: Reviewed  Assessment and Plan:   NAKSH RADI is a 78 y.o. male with history of acute inflammatory ileitis, ileal diverticulosis, probable diverticulitis with perforation, abscess s/p drainage, colonoscopy revealed active terminal ileitis with no evidence of chronicity on histology.   MR enterography revealed pseudo sacculation, mural stratification involving the distal ileum as well as associated stricture without significant upstream bowel obstruction or  current evidence of penetrating disease. Patient developed recurrence of terminal ileitis in 7/21, underwent laparoscopic ileocecectomy on 09/13/2019, 35 cm of terminal ileum was removed with 5 cm of cecum, s/p temporary ileostomy followed by closure in 11/2019.  Pathology confirmed Crohn's disease.  Patient is here to discuss about symptoms of right lower quadrant pain associated with abdominal bloating  Crohn's disease of the terminal ileum, stricturing and penetrating phenotype s/p ileocecectomy and ileostomy on 09/13/2019, s/p takedown in 11/2019 No obvious evidence of postop recurrence of Crohn's disease based on colonoscopy in 02/2020.  However, I could not traverse into neoterminal ileum due to postsurgical anatomy Patient is currently on colestipol  and budesonide  3 mg 1 pill daily Patient could not afford high co-pay for Humira as well as vedolizumab Patient is not willing to try any biologic medication at this time because he is feeling well Calprotectin levels were normal in the past Recheck fecal calprotectin levels Check CBC with differential, CRP MRI abdomen in 09/2022 did not reveal any inflammation at the ileocolonic anastomosis Discussed with patient that based on the above test results,  we might proceed with colonoscopy or increase budesonide  to 9 mg daily Patient is immune to hepatitis A, hepatitis B negative, HCV negative, Quant Farren gold negative  Iron deficiency anemia: Resolved   Follow up in 3 to 4 months   Corinn JONELLE Brooklyn, MD

## 2023-03-11 ENCOUNTER — Telehealth: Payer: Self-pay

## 2023-03-11 LAB — CBC WITH DIFFERENTIAL/PLATELET
Basophils Absolute: 0.1 10*3/uL (ref 0.0–0.2)
Basos: 1 %
EOS (ABSOLUTE): 0.1 10*3/uL (ref 0.0–0.4)
Eos: 1 %
Hematocrit: 40.5 % (ref 37.5–51.0)
Hemoglobin: 12.8 g/dL — ABNORMAL LOW (ref 13.0–17.7)
Immature Grans (Abs): 0.1 10*3/uL (ref 0.0–0.1)
Immature Granulocytes: 1 %
Lymphocytes Absolute: 1.3 10*3/uL (ref 0.7–3.1)
Lymphs: 12 %
MCH: 27.6 pg (ref 26.6–33.0)
MCHC: 31.6 g/dL (ref 31.5–35.7)
MCV: 87 fL (ref 79–97)
Monocytes Absolute: 0.4 10*3/uL (ref 0.1–0.9)
Monocytes: 4 %
Neutrophils Absolute: 8.9 10*3/uL — ABNORMAL HIGH (ref 1.4–7.0)
Neutrophils: 81 %
Platelets: 296 10*3/uL (ref 150–450)
RBC: 4.64 x10E6/uL (ref 4.14–5.80)
RDW: 13.5 % (ref 11.6–15.4)
WBC: 10.9 10*3/uL — ABNORMAL HIGH (ref 3.4–10.8)

## 2023-03-11 LAB — C-REACTIVE PROTEIN: CRP: 2 mg/L (ref 0–10)

## 2023-03-11 NOTE — Telephone Encounter (Signed)
 Tried to call patient but number just kept ringing unable to leave a message

## 2023-03-11 NOTE — Telephone Encounter (Signed)
-----   Message from [facility] sent at 03/11/2023  1:59 PM EST ----- Please inform patient that blood work results show he does have mild anemia and WBC count is elevated which is mild, waiting on his fecal calprotectin levels  RV

## 2023-03-12 NOTE — Telephone Encounter (Signed)
Called and left a message with a lady that answered the phone and she said she will tell him to call me.

## 2023-03-12 NOTE — Telephone Encounter (Signed)
Patient verbalized understanding of results  

## 2023-03-23 ENCOUNTER — Telehealth: Payer: Self-pay

## 2023-03-23 NOTE — Telephone Encounter (Signed)
PT is here in office and would like to now get the scan that was mention when he was her last time.

## 2023-03-23 NOTE — Telephone Encounter (Signed)
Called and left a message for call back

## 2023-03-24 NOTE — Telephone Encounter (Signed)
Called and left a message for call back

## 2023-03-25 LAB — CALPROTECTIN, FECAL: Calprotectin, Fecal: 187 ug/g — ABNORMAL HIGH (ref 0–120)

## 2023-03-25 NOTE — Telephone Encounter (Signed)
Called and left a message for call back and she states that he forgot to call me yesterday but she will get him to call me today

## 2023-03-25 NOTE — Telephone Encounter (Signed)
Patient cCalprotectin levels are back please advise if he needs a ultrasound

## 2023-03-26 ENCOUNTER — Telehealth: Payer: Self-pay | Admitting: Gastroenterology

## 2023-03-26 ENCOUNTER — Telehealth: Payer: Self-pay

## 2023-03-26 DIAGNOSIS — K50012 Crohn's disease of small intestine with intestinal obstruction: Secondary | ICD-10-CM

## 2023-03-26 DIAGNOSIS — E349 Endocrine disorder, unspecified: Secondary | ICD-10-CM

## 2023-03-26 NOTE — Telephone Encounter (Signed)
Called patient and patient verbalized understanding of results. He states he would rather have a CT scan then a colonoscopy. Order the CT scan. He states he has been taking 3 pills daily the last couple days have taken 4  pills and his symptoms have improved. Informed him 4 pills was to many only needed to be taking 3 pills daily. Called and got CT scan scheduled for 04/01/2022 arrive at 7:30am for a 8:30am scan. Nothing to eat or drink after midnight. Called patient back

## 2023-03-26 NOTE — Telephone Encounter (Signed)
-----   Message from [facility] sent at 03/26/2023  9:27 AM EST ----- Elevated fecal calprotectin levels.  Please check with patient if he is taking budesonide 6 mg or 9 mg.  If he is taking 6 mg daily, increase to 9 mg daily.  If he is already taking 9 mg daily, recommend CT enterography or colonoscopy for further evaluation.   Dx: Small bowel Crohn's, exacerbation  RV

## 2023-03-26 NOTE — Telephone Encounter (Signed)
Pt left message returning call requesting call back

## 2023-03-26 NOTE — Telephone Encounter (Signed)
I already return patient call and talk to patient

## 2023-04-02 ENCOUNTER — Ambulatory Visit
Admission: RE | Admit: 2023-04-02 | Discharge: 2023-04-02 | Disposition: A | Discharge status: Home / Self Care | Payer: Medicare HMO | Source: Ambulatory Visit | Attending: Gastroenterology | Admitting: Gastroenterology

## 2023-04-02 DIAGNOSIS — E349 Endocrine disorder, unspecified: Secondary | ICD-10-CM | POA: Diagnosis present

## 2023-04-02 DIAGNOSIS — K50012 Crohn's disease of small intestine with intestinal obstruction: Secondary | ICD-10-CM | POA: Diagnosis present

## 2023-04-02 MED ORDER — IOHEXOL 300 MG/ML  SOLN
100.0000 mL | Freq: Once | INTRAMUSCULAR | Status: AC | PRN
  Administered 2023-04-02: 100 mL via INTRAVENOUS

## 2023-04-20 ENCOUNTER — Telehealth: Payer: Self-pay

## 2023-04-20 NOTE — Telephone Encounter (Signed)
 Called and left a message for call back

## 2023-04-20 NOTE — Telephone Encounter (Signed)
-----   Message from [facility] sent at 04/20/2023  3:54 PM EST ----- Regarding: RE: Pulmonary nodules Thanks for your input  Morrie Sheldon  Please forward/fax these notes to his PCP Dr. Maryjane Hurter and inform patient about discussing chest imaging when he undergoes MRI in April 2025  RV ----- Message ----- From: Sondra Come, MD Sent: 04/20/2023  11:03 AM EST To: Toney Reil, MD Subject: Pulmonary nodules                              Very low suspicion regarding the pulmonary nodules.  He had a 2 cm renal mass that was ablated by interventional radiology a few years ago, these typically do not recur or cause metastasis, he is followed by IR.  His next appointment is in April 2025 for an MRI, and they can order further chest imaging if they feel its warranted at that time  Thanks Legrand Rams, MD 04/20/2023 ----- Message ----- From: Toney Reil, MD Sent: 04/18/2023   2:56 PM EST To: Sondra Come, MD; Denman George, CMA  Morrie Sheldon  Please inform patient that MRI of the abdomen shows inflammation in his left colon which goes along with abnormal stool study results.  Therefore, I recommend colonoscopy for further evaluation to see if he has active colitis Also, please let patient know about the small pulmonary nodule and he has to follow-up with his PCP/Dr Richardo Hanks  Dr. Richardo Hanks There was an incidental pulmonary nodule on his MRI and given his history of renal cell carcinoma, radiologist is raising the concern for possible pulmonary metastasis.  I would like to know your opinion  Thanks much Rohini Vanga

## 2023-04-20 NOTE — Telephone Encounter (Signed)
-----   Message from [facility] sent at 04/18/2023  2:56 PM EST ----- Todd Briggs  Please inform patient that MRI of the abdomen shows inflammation in his left colon which goes along with abnormal stool study results.  Therefore, I recommend colonoscopy for further evaluation to see if he has active colitis Also, please let patient know about the small pulmonary nodule and he has to follow-up with his PCP/Dr Richardo Hanks  Dr. Richardo Hanks There was an incidental pulmonary nodule on his MRI and given his history of renal cell carcinoma, radiologist is raising the concern for possible pulmonary metastasis.  I would like to know your opinion  Thanks much Rohini Vanga

## 2023-04-20 NOTE — Telephone Encounter (Signed)
-----   Message from Sondra Come sent at 04/20/2023 11:02 AM EST ----- Regarding: Pulmonary nodules Very low suspicion regarding the pulmonary nodules.  He had a 2 cm renal mass that was ablated by interventional radiology a few years ago, these typically do not recur or cause metastasis, he is followed by IR.  His next appointment is in April 2025 for an MRI, and they can order further chest imaging if they feel its warranted at that time  Thanks Legrand Rams, MD 04/20/2023 ----- Message ----- From: Toney Reil, MD Sent: 04/18/2023   2:56 PM EST To: Sondra Come, MD; Denman George, CMA  Morrie Sheldon  Please inform patient that MRI of the abdomen shows inflammation in his left colon which goes along with abnormal stool study results.  Therefore, I recommend colonoscopy for further evaluation to see if he has active colitis Also, please let patient know about the small pulmonary nodule and he has to follow-up with his PCP/Dr Richardo Hanks  Dr. Richardo Hanks There was an incidental pulmonary nodule on his MRI and given his history of renal cell carcinoma, radiologist is raising the concern for possible pulmonary metastasis.  I would like to know your opinion  Thanks much Brevyn Ring

## 2023-04-21 ENCOUNTER — Other Ambulatory Visit: Payer: Self-pay

## 2023-04-21 DIAGNOSIS — K50012 Crohn's disease of small intestine with intestinal obstruction: Secondary | ICD-10-CM

## 2023-04-21 MED ORDER — NA SULFATE-K SULFATE-MG SULF 17.5-3.13-1.6 GM/177ML PO SOLN: 354.0000 mL | Freq: Once | ORAL | 0 refills | Status: AC

## 2023-04-21 NOTE — Telephone Encounter (Signed)
 Patient verbalized understanding of results. Patient asked why he needs to have the colonoscopy informed him it was to see if he has active colitis and if so we could change his medication. He states why can we just change now. Informed him we have to make sure by biopsy. He states he guess he will schedule one. Schedule it for 05/05/2023 in [place]. Went over instructions, mailed them, sent to [employer]. Sent prep to the pharmacy

## 2023-04-21 NOTE — Telephone Encounter (Signed)
 Patient wife called and said to call his cell phone number and we would be able to reach him. Tried to call him on his cell phone number and she answers and states he left it on the table and she did not realize it. She said she will have him give [place] a call

## 2023-04-21 NOTE — Telephone Encounter (Signed)
 Called and left a message for call back

## 2023-04-22 ENCOUNTER — Encounter: Payer: Self-pay | Admitting: Gastroenterology

## 2023-04-22 NOTE — Anesthesia Preprocedure Evaluation (Addendum)
 Anesthesia Evaluation  Patient identified by MRN, date of birth, ID band Patient awake    Reviewed: Allergy & Precautions, H&P , NPO status , Patient's Chart, lab work & pertinent test results  Airway Mallampati: IV  TM Distance: <3 FB Neck ROM: Full    Dental no notable dental hx. (+) Upper Dentures   Pulmonary shortness of breath, former smoker   Pulmonary exam normal breath sounds clear to auscultation       Cardiovascular negative cardio ROS Normal cardiovascular exam Rhythm:Regular Rate:Normal     Neuro/Psych negative neurological ROS  negative psych ROS   GI/Hepatic negative GI ROS, Neg liver ROS, PUD,GERD  ,,  Endo/Other  negative endocrine ROSdiabetes    Renal/GU Renal diseasenegative Renal ROS  negative genitourinary   Musculoskeletal negative musculoskeletal ROS (+) Arthritis ,    Abdominal   Peds negative pediatric ROS (+)  Hematology negative hematology ROS (+) Blood dyscrasia, anemia   Anesthesia Other Findings   IBS (irritable bowel syndrome) Peptic ulcer Anemia  Allergy Scarlet fever  GERD (gastroesophageal reflux disease) Arthritis  Cataracts, both eyes Wears dentures  Wears hearing aid in right ear Deaf, left  Abdominal pain Hypokalemia  Diarrhea Shortness of breath dyspnea Chronic kidney disease Basal cell carcinoma  Crohn's colitis, with abscess (HCC) Diverticulitis   Patient is nauseated, will administer zofran preop   Reproductive/Obstetrics negative OB ROS                              Anesthesia Physical Anesthesia Plan  ASA: 3  Anesthesia Plan: General   Post-op Pain Management:    Induction: Intravenous  PONV Risk Score and Plan:   Airway Management Planned: Natural Airway and Nasal Cannula  Additional Equipment:   Intra-op Plan:   Post-operative Plan:   Informed Consent: I have reviewed the patients History and Physical, chart,  labs and discussed the procedure including the risks, benefits and alternatives for the proposed anesthesia with the patient or authorized representative who has indicated his/her understanding and acceptance.     Dental Advisory Given  Plan Discussed with: Anesthesiologist, CRNA and Surgeon  Anesthesia Plan Comments: (Patient consented for risks of anesthesia including but not limited to:  - adverse reactions to medications - risk of airway placement if required - damage to eyes, teeth, lips or other oral mucosa - nerve damage due to positioning  - sore throat or hoarseness - Damage to heart, brain, nerves, lungs, other parts of body or loss of life  Patient voiced understanding and assent.)         Anesthesia Quick Evaluation

## 2023-05-03 ENCOUNTER — Other Ambulatory Visit: Payer: Self-pay | Admitting: Gastroenterology

## 2023-05-03 DIAGNOSIS — K50012 Crohn's disease of small intestine with intestinal obstruction: Secondary | ICD-10-CM

## 2023-05-03 DIAGNOSIS — D509 Iron deficiency anemia, unspecified: Secondary | ICD-10-CM

## 2023-05-05 ENCOUNTER — Encounter: Payer: Self-pay | Admitting: Gastroenterology

## 2023-05-05 ENCOUNTER — Encounter
Admission: RE | Disposition: A | Discharge status: Home / Self Care | Payer: Self-pay | Source: Ambulatory Visit | Attending: Gastroenterology

## 2023-05-05 ENCOUNTER — Ambulatory Visit: Payer: Self-pay | Admitting: Anesthesiology

## 2023-05-05 ENCOUNTER — Other Ambulatory Visit: Payer: Self-pay

## 2023-05-05 ENCOUNTER — Ambulatory Visit
Admission: RE | Admit: 2023-05-05 | Discharge: 2023-05-05 | Disposition: A | Discharge status: Home / Self Care | Payer: Medicare HMO | Source: Ambulatory Visit | Attending: Gastroenterology | Admitting: Gastroenterology

## 2023-05-05 DIAGNOSIS — Z980 Intestinal bypass and anastomosis status: Secondary | ICD-10-CM | POA: Diagnosis not present

## 2023-05-05 DIAGNOSIS — K5000 Crohn's disease of small intestine without complications: Secondary | ICD-10-CM

## 2023-05-05 DIAGNOSIS — Z8711 Personal history of peptic ulcer disease: Secondary | ICD-10-CM | POA: Insufficient documentation

## 2023-05-05 DIAGNOSIS — K9189 Other postprocedural complications and disorders of digestive system: Secondary | ICD-10-CM

## 2023-05-05 DIAGNOSIS — K635 Polyp of colon: Secondary | ICD-10-CM

## 2023-05-05 DIAGNOSIS — Z87891 Personal history of nicotine dependence: Secondary | ICD-10-CM | POA: Diagnosis not present

## 2023-05-05 DIAGNOSIS — K50012 Crohn's disease of small intestine with intestinal obstruction: Secondary | ICD-10-CM

## 2023-05-05 HISTORY — PX: POLYPECTOMY: SHX5525

## 2023-05-05 HISTORY — PX: COLONOSCOPY WITH PROPOFOL: SHX5780

## 2023-05-05 SURGERY — COLONOSCOPY WITH PROPOFOL
Anesthesia: General | Site: Rectum

## 2023-05-05 MED ORDER — STERILE WATER FOR IRRIGATION IR SOLN
Status: DC | PRN
  Administered 2023-05-05: 1

## 2023-05-05 MED ORDER — ONDANSETRON HCL 4 MG/2ML IJ SOLN
INTRAMUSCULAR | Status: AC
  Filled 2023-05-05: qty 2

## 2023-05-05 MED ORDER — SODIUM CHLORIDE 0.9 % IV SOLN
INTRAVENOUS | Status: DC
Start: 2023-05-05 — End: 2023-05-05

## 2023-05-05 MED ORDER — ONDANSETRON HCL 4 MG/2ML IJ SOLN
4.0000 mg | Freq: Once | INTRAMUSCULAR | Status: AC
Start: 2023-05-05 — End: 2023-05-05
  Administered 2023-05-05: 4 mg via INTRAVENOUS

## 2023-05-05 MED ORDER — PROPOFOL 10 MG/ML IV BOLUS
INTRAVENOUS | Status: DC | PRN
  Administered 2023-05-05 (×3): 30 mg via INTRAVENOUS
  Administered 2023-05-05: 20 mg via INTRAVENOUS
  Administered 2023-05-05 (×2): 30 mg via INTRAVENOUS
  Administered 2023-05-05: 20 mg via INTRAVENOUS
  Administered 2023-05-05 (×2): 30 mg via INTRAVENOUS
  Administered 2023-05-05: 100 mg via INTRAVENOUS

## 2023-05-05 MED ORDER — STERILE WATER FOR IRRIGATION IR SOLN
Status: DC | PRN
  Administered 2023-05-05: 120 mL

## 2023-05-05 MED ORDER — LACTATED RINGERS IV SOLN: INTRAVENOUS | Status: DC

## 2023-05-05 SURGICAL SUPPLY — 16 items
CLIP HMST 235XBRD CATH ROT (MISCELLANEOUS) IMPLANT
ELECT REM PT RETURN 9FT ADLT (ELECTROSURGICAL) IMPLANT
ELECTRODE REM PT RTRN 9FT ADLT (ELECTROSURGICAL) IMPLANT
FORCEPS BIOP RAD 4 LRG CAP 4 (CUTTING FORCEPS) IMPLANT
GOWN CVR UNV OPN BCK APRN NK (MISCELLANEOUS) ×4 IMPLANT
INJECTOR VARIJECT VIN23 (MISCELLANEOUS) IMPLANT
KIT DEFENDO VALVE AND CONN (KITS) IMPLANT
KIT PRC NS LF DISP ENDO (KITS) ×2 IMPLANT
MANIFOLD NEPTUNE II (INSTRUMENTS) ×2 IMPLANT
MARKER SPOT ENDO TATTOO 5ML (MISCELLANEOUS) IMPLANT
PROBE APC STR FIRE (PROBE) IMPLANT
RETRIEVER NET ROTH 2.5X230 LF (MISCELLANEOUS) IMPLANT
SNARE COLD EXACTO (MISCELLANEOUS) IMPLANT
TRAP ETRAP POLY (MISCELLANEOUS) IMPLANT
VARIJECT INJECTOR VIN23 (MISCELLANEOUS) IMPLANT
WATER STERILE IRR 250ML POUR (IV SOLUTION) ×2 IMPLANT

## 2023-05-05 NOTE — Transfer of Care (Signed)
 Immediate Anesthesia Transfer of Care Note  Patient: Todd Briggs  Procedure(s) Performed: COLONOSCOPY WITH PROPOFOL WITH  BIOPSIES (Rectum) POLYPECTOMY (Rectum)  Patient Location: PACU  Anesthesia Type: General  Level of Consciousness: awake, alert  and patient cooperative  Airway and Oxygen Therapy: Patient Spontanous Breathing and Patient connected to supplemental oxygen  Post-op Assessment: Post-op Vital signs reviewed, Patient's Cardiovascular Status Stable, Respiratory Function Stable, Patent Airway and No signs of Nausea or vomiting  Post-op Vital Signs: Reviewed and stable  Complications: No notable events documented.

## 2023-05-05 NOTE — H&P (Signed)
 Arlyss Repress, MD [address]  [address]  [place], [place] [address]  Main: [phone]  Fax: [phone] Pager: [phone]  Primary Care Physician:  Marina Goodell, MD Primary Gastroenterologist:  Dr. Arlyss Repress  Pre-Procedure History & Physical: HPI:  Todd Briggs is a 78 y.o. male is here for an colonoscopy.   Past Medical History:  Diagnosis Date   Abdominal pain 02/15/2019   Allergy    Seasonal   Anemia    Arthritis    Basal cell carcinoma    Removed 1980's, renal   Cataracts, both eyes    Chronic kidney disease    Crohn's colitis, with abscess (HCC) 08/2019   Deaf, left    Diarrhea 02/21/2019   Diverticulitis    GERD (gastroesophageal reflux disease)    Hypokalemia 02/21/2019   IBS (irritable bowel syndrome)    Peptic ulcer 79 years old   Scarlet fever 78 years old   Shortness of breath dyspnea 1992   with exertion due to chemical at work   Wears dentures    full upper   Wears hearing aid in right ear     Past Surgical History:  Procedure Laterality Date   CATARACT EXTRACTION W/ INTRAOCULAR LENS  IMPLANT, BILATERAL     CHOLECYSTECTOMY N/A 03/30/2015   Procedure: LAPAROSCOPIC CHOLECYSTECTOMY;  Surgeon: Gladis Riffle, MD;  Location: [facility];  Service: General;  Laterality: N/A;   COLON SURGERY  09/13/2019   .ileostomy. Pt reports complications   COLONOSCOPY WITH PROPOFOL N/A 04/15/2019   Procedure: COLONOSCOPY WITH BIOPSY AND DILATION;  Surgeon: Midge Minium, MD;  Location: [facility];  Service: Endoscopy;  Laterality: N/A;  Priority 3   COLONOSCOPY WITH PROPOFOL N/A 03/20/2020   Procedure: COLONOSCOPY WITH PROPOFOL;  Surgeon: Toney Reil, MD;  Location: [facility];  Service: Gastroenterology;  Laterality: N/A;   EYE SURGERY     HERNIA REPAIR Bilateral 78 years old   Inguinal Hernia   IR RADIOLOGIST EVAL & MGMT  04/24/2020   IR RADIOLOGIST EVAL & MGMT  07/03/2020   IR RADIOLOGIST EVAL & MGMT  10/03/2020   IR  RADIOLOGIST EVAL & MGMT  04/16/2021   IR RADIOLOGIST EVAL & MGMT  06/04/2022   IR RADIOLOGIST EVAL & MGMT  10/13/2022   lasik     RADIOLOGY WITH ANESTHESIA Right 06/06/2020   Procedure: RADIOLOGY WITH ANESTHESIA  CRYOABLATION OF RIGHT RENAL MASS;  Surgeon: Oley Balm, MD;  Location: [facility];  Service: Radiology;  Laterality: Right;   reanastomosis of ileostomy  2021    Prior to Admission medications   Medication Sig Start Date End Date Taking? Authorizing Provider  acetaminophen (TYLENOL) 500 MG tablet Take 500 mg by mouth every 8 (eight) hours as needed for moderate pain.   Yes [provider]  budesonide (ENTOCORT EC) 3 MG 24 hr capsule TAKE 3 CAPSULES (9 MG TOTAL) BY MOUTH DAILY. 07/15/22  Yes Haden Cavenaugh, Loel Dubonnet, MD  cetirizine (ZYRTEC) 10 MG tablet Take 10 mg by mouth daily.   Yes [provider]  colestipol (COLESTID) 1 g tablet TAKE 2 TABLETS BY MOUTH TWICE A DAY 05/04/23  Yes Toney Reil, MD    Allergies as of 04/21/2023 - Review Complete 03/10/2023  Allergen Reaction Noted   Azelastine Itching and Rash 08/01/2022   Penicillins Rash 12/05/2013    Family History  Problem Relation Age of Onset   Hypertension Mother    Diabetes Mother    Pancreatitis Mother  Heart disease Mother    Heart attack Father    COPD Father    Heart disease Father     Social History   Socioeconomic History   Marital status: Married    Spouse name: Not on file   Number of children: Not on file   Years of education: Not on file   Highest education level: Not on file  Occupational History   Not on file  Tobacco Use   Smoking status: Former    Current packs/day: 0.00    Types: Cigarettes    Quit date: 02/28/1985    Years since quitting: 38.2    Passive exposure: Past   Smokeless tobacco: Never  Vaping Use   Vaping status: Never Used  Substance and Sexual Activity   Alcohol use: Yes    Comment: rare   Drug use: No   Sexual activity: Yes    Birth  control/protection: None  Other Topics Concern   Not on file  Social History Narrative   Not on file   Social Drivers of Health   Financial Resource Strain: Low Risk  (01/21/2023)   Received from [facility]   Overall Financial Resource Strain (CARDIA)    Difficulty of Paying Living Expenses: Not hard at all  Food Insecurity: No Food Insecurity (01/21/2023)   Received from [facility]   Hunger Vital Sign    Worried About Running Out of Food in the Last Year: Never true    Ran Out of Food in the Last Year: Never true  Transportation Needs: No Transportation Needs (01/21/2023)   Received from [facility] - Transportation    In the past 12 months, has lack of transportation kept you from medical appointments or from getting medications?: No    Lack of Transportation (Non-Medical): No  Physical Activity: Not on file  Stress: Not on file  Social Connections: Not on file  Intimate Partner Violence: Not on file    Review of Systems: See HPI, otherwise negative ROS  Physical Exam: BP (!) 182/114   Temp 98.1 F (36.7 C) (Temporal)   Resp 14   Ht 5\' 10"  (1.778 m)   Wt 70.9 kg   SpO2 99%   BMI 22.44 kg/m  General:   Alert,  pleasant and cooperative in NAD Head:  Normocephalic and atraumatic. Neck:  Supple; no masses or thyromegaly. Lungs:  Clear throughout to auscultation.    Heart:  Regular rate and rhythm. Abdomen:  Soft, nontender and nondistended. Normal bowel sounds, without guarding, and without rebound.   Neurologic:  Alert and  oriented x4;  grossly normal neurologically.  Impression/Plan: Todd Briggs is here for an colonoscopy to be performed for h/o crohn's disease. Elevated fecal calprotectin levels  Risks, benefits, limitations, and alternatives regarding  colonoscopy have been reviewed with the patient.  Questions have been answered.  All parties agreeable.   Lannette Donath, MD  05/05/2023, 7:47  AM

## 2023-05-05 NOTE — Op Note (Signed)
 [facility] Gastroenterology Patient Name: Todd Briggs Procedure Date: 05/05/2023 8:23 AM MRN: [id number] Account #: [id number] Date of Birth: [birth year] Admit Type: Outpatient Age: 78 Room: [facility] OR ROOM 01 Gender: Male Note Status: Finalized Instrument Name: [id number] Procedure:             Colonoscopy Indications:           Last colonoscopy: January 2022, Follow-up of Crohn's                         disease of the small bowel, Disease activity                         assessment of Crohn's disease of the small bowel Providers:             Toney Reil MD, MD Referring MD:          Marina Goodell (Referring MD) Medicines:             General Anesthesia Complications:         No immediate complications. Estimated blood loss:                         Minimal. Procedure:             Pre-Anesthesia Assessment:                        - Prior to the procedure, a History and Physical was                         performed, and patient medications and allergies were                         reviewed. The patient is competent. The risks and                         benefits of the procedure and the sedation options and                         risks were discussed with the patient. All questions                         were answered and informed consent was obtained.                         Patient identification and proposed procedure were                         verified by the physician, the nurse, the                         anesthesiologist, the anesthetist and the technician                         in the pre-procedure area in the procedure room in the                         endoscopy suite. Mental Status Examination: alert and  oriented. Airway Examination: normal oropharyngeal                         airway and neck mobility. Respiratory Examination:                         clear to auscultation. CV Examination: normal.                          Prophylactic Antibiotics: The patient does not require                         prophylactic antibiotics. Prior Anticoagulants: The                         patient has taken no anticoagulant or antiplatelet                         agents. ASA Grade Assessment: III - A patient with                         severe systemic disease. After reviewing the risks and                         benefits, the patient was deemed in satisfactory                         condition to undergo the procedure. The anesthesia                         plan was to use general anesthesia. Immediately prior                         to administration of medications, the patient was                         re-assessed for adequacy to receive sedatives. The                         heart rate, respiratory rate, oxygen saturations,                         blood pressure, adequacy of pulmonary ventilation, and                         response to care were monitored throughout the                         procedure. The physical status of the patient was                         re-assessed after the procedure.                        After obtaining informed consent, the colonoscope was                         passed under direct vision. Throughout the procedure,  the patient's blood pressure, pulse, and oxygen                         saturations were monitored continuously. The was                         introduced through the anus and advanced to the the                         ileocolonic anastomosis. The colonoscopy was performed                         without difficulty. The patient tolerated the                         procedure well. The quality of the bowel preparation                         was fair. The terminal ileum, ileocecal valve,                         appendiceal orifice, and rectum were photographed. Findings:      The perianal and digital rectal examinations were normal.  Pertinent       negatives include normal sphincter tone and no palpable rectal lesions.      There was evidence of a prior end-to-side ileo-colonic anastomosis in       the ascending colon. This was patent and was characterized by       inflammation. The anastomosis was traversed.      Patchy inflammation characterized by congestion (edema), friability,       mucus and aphthous ulcerations was found in the neo-terminal Ileum. The       inflammation was graded as Rutgeerts Score i2 (more than five aphthous       lesions with normal intervening mucosa or skip areas of larger lesions       or lesions confined to the ileocolonic anastomosis). Biopsies were taken       with a cold forceps for histology.      Two sessile polyps were found in the sigmoid colon and descending colon.       The polyps were 4 to 5 mm in size. These polyps were removed with a cold       snare. Resection and retrieval were complete.      Normal mucosa was found in the left colon and in the right colon.       Biopsies were taken with a cold forceps for histology.      The retroflexed view of the distal rectum and anal verge was normal and       showed no anal or rectal abnormalities. Impression:            - Preparation of the colon was fair.                        - Patent end-to-side ileo-colonic anastomosis,                         characterized by inflammation.                        -  Crohn's disease with ileitis. Inflammation was                         found. This was graded as Rutgeerts Score i2 (more                         than five aphthous lesions with normal intervening                         mucosa or skip areas of larger lesions or lesions                         confined to the ileocolonic anastomosis). Biopsied.                        - Two 4 to 5 mm polyps in the sigmoid colon and in the                         descending colon, removed with a cold snare. Resected                         and  retrieved.                        - Normal mucosa in the left colon and in the right                         colon. Biopsied.                        - The distal rectum and anal verge are normal on                         retroflexion view. Recommendation:        - Discharge patient to home (with escort).                        - Resume previous diet today.                        - Continue present medications.                        - Await pathology results.                        - Return to my office as previously scheduled. Procedure Code(s):     --- Professional ---                        [phone], Colonoscopy, flexible; with removal of                         tumor(s), polyp(s), or other lesion(s) by snare                         technique                        45380, 59, Colonoscopy, flexible; with biopsy, single  or multiple Diagnosis Code(s):     --- Professional ---                        Z98.0, Intestinal bypass and anastomosis status                        K50.00, Crohn's disease of small intestine without                         complications                        D12.5, Benign neoplasm of sigmoid colon                        D12.4, Benign neoplasm of descending colon CPT copyright 2022 American Medical Association. All rights reserved. The codes documented in this report are preliminary and upon coder review may  be revised to meet current compliance requirements. Dr. Libby Maw Toney Reil MD, MD 05/05/2023 9:12:06 AM This report has been signed electronically. Number of Addenda: 0 Note Initiated On: 05/05/2023 8:23 AM Scope Withdrawal Time: 0 hours 22 minutes 15 seconds  Total Procedure Duration: 0 hours 27 minutes 10 seconds  Estimated Blood Loss:  Estimated blood loss was minimal.      [facility]

## 2023-05-05 NOTE — Anesthesia Postprocedure Evaluation (Signed)
 Anesthesia Post Note  Patient: Todd Briggs  Procedure(s) Performed: COLONOSCOPY WITH PROPOFOL WITH  BIOPSIES (Rectum) POLYPECTOMY (Rectum)  Patient location during evaluation: PACU Anesthesia Type: General Level of consciousness: awake and alert Pain management: pain level controlled Vital Signs Assessment: post-procedure vital signs reviewed and stable Respiratory status: spontaneous breathing, nonlabored ventilation, respiratory function stable and patient connected to nasal cannula oxygen Cardiovascular status: blood pressure returned to baseline and stable Postop Assessment: no apparent nausea or vomiting Anesthetic complications: no   No notable events documented.   Last Vitals:  Vitals:   05/05/23 0926 05/05/23 0930  BP: 129/85 132/84  Pulse: 69 69  Resp: 11 20  Temp:  36.9 C  SpO2: 99% 100%    Last Pain:  Vitals:   05/05/23 0930  TempSrc:   PainSc: 0-No pain                 Lucille Crichlow C Leondro Coryell

## 2023-05-06 ENCOUNTER — Encounter: Payer: Self-pay | Admitting: Gastroenterology

## 2023-05-06 ENCOUNTER — Telehealth: Payer: Self-pay

## 2023-05-06 LAB — SURGICAL PATHOLOGY

## 2023-05-06 NOTE — Telephone Encounter (Signed)
 Tried to call patient and patient wife states he is not available at this time but will be back at the house in a few minutes

## 2023-05-06 NOTE — Telephone Encounter (Signed)
-----   Message from [facility] sent at 05/06/2023  4:06 PM EDT ----- Todd Briggs  Please inform patient that the pathology results from his colonoscopy shows moderately active small bowel Crohn's which explains right upper quadrant pain and elevated fecal calprotectin levels.  I would like to discuss treatment with him.  I can see him next week, okay to overbook  RV

## 2023-05-06 NOTE — Telephone Encounter (Signed)
 Patient declined to make appointment next week he states he is not going to do any treatment anyway and he does not have time to make a appointment. He states he will see you at his schedule appointment on 06/09/2023

## 2023-05-23 ENCOUNTER — Other Ambulatory Visit: Payer: Self-pay | Admitting: Gastroenterology

## 2023-06-04 ENCOUNTER — Other Ambulatory Visit: Payer: Self-pay

## 2023-06-09 ENCOUNTER — Encounter: Payer: Self-pay | Admitting: Gastroenterology

## 2023-06-09 ENCOUNTER — Ambulatory Visit: Payer: Medicare HMO | Admitting: Gastroenterology

## 2023-06-09 VITALS — BP 190/95 | HR 73 | Temp 97.5°F | Ht 70.0 in | Wt 162.4 lb

## 2023-06-09 DIAGNOSIS — K50012 Crohn's disease of small intestine with intestinal obstruction: Secondary | ICD-10-CM

## 2023-06-09 NOTE — Patient Instructions (Addendum)
 Will Apply For Entyvio infusion and then Injection.

## 2023-06-09 NOTE — Progress Notes (Signed)
 Arlyss Repress, MD [address]  [address]  [place], [place] [address]  Main: [phone]  Fax: [phone]    Gastroenterology Consultation  Referring Provider:     Marina Goodell, MD Primary Care Physician:  Marina Goodell, MD Primary Gastroenterologist:  Dr. Lannette Donath Reason for Consultation:  small bowel Crohn's        HPI:   Todd Briggs is a 78 y.o. male referred by Dr. Maryjane Hurter Madaline Guthrie, MD  for consultation & management of small bowel Crohn's.  Patient was originally admitted on 02/15/2019 secondary to right lower abdominal pain associated with nonbloody loose bowel movements. Patient underwent CT abdomen which revealed fairly extensive inflammatory ileitis with mucosal and serosal enhancement, submucosal edema, multiple ileal diverticulosis.  Patient was treated with antibiotics during that admission and was discharged home.  He was readmitted on 02/21/2019 due to ongoing symptoms, repeat CT revealed multiple air-fluid collections, intercommunicating, likely related to rupture of the small ileal diverticulum.  Patient underwent CT-guided drainage of the abscess, drain placement, antibiotics.  He subsequently underwent outpatient colonoscopy after healing of the abscess in 03/2019 which revealed terminal ileitis and normal colon.  Biopsies confirmed active patchy ileitis with no evidence of chronicity.  Random colon biopsies were unremarkable.  Patient developed recurrence of abscess with worsening of right lower quadrant pain, evaluated by Dr. Milbert Coulter, colorectal surgeon at [facility]. He underwent laparoscopic ileocecectomy on 09/13/2019 at [facility] by Dr. Milbert Coulter, and ileostomy takedown on 12/19/2019 and he recovered well from the surgery.   He underwent image guided cryoablation of right renal mass on 06/07/2020.  With regards to his Crohn's disease, unfortunately, patient could not afford high co-pay for Humira.  We tried [facility], he is not able to afford $1100 infusion  charge for entyvio as well.  Patient did not want to apply for patient assistance program because he thinks he will not qualify for it.  Patient is currently being maintained on budesonide 3 mg 2 to 3 pills daily, colestipol 2 pills daily.  He reports that he is having 1 formed bowel movement a day, sometimes a second BM depending on what he eats.  He denies any abdominal bloating.  He is gaining weight.  He denies any abdominal pain, does notice that his stools smell differently since surgery, he denies any rectal bleeding.  Patient does admit to drinking carbonated beverages, sugary drinks daily and he consumes red meat about twice a week.  Labs from 05/2020 revealed mild iron deficiency anemia.  Follow-up visit 01/07/2022 Patient is here for follow-up of Crohn's disease.  He has been doing well.  He is currently on budesonide 3 mg 1 pill daily and colestipol 1 pill a day.  Patient does not want to start any biologic therapy because of the cost.  He is not willing to apply for patient assistance program as well.  Patient reports mild chronic right lower quadrant discomfort which has been ongoing since his surgery.  He reports having 1-2 formed bowel movements daily, denies any rectal bleeding.  He denies any abdominal bloating.  Follow-up visit 03/11/2023 Todd Briggs is here for follow-up of his Crohn's disease.  He reports that he has been experiencing right lower quadrant discomfort along with abdominal bloating.  He continues to take colestipol as well as budesonide and reports having 1 bowel movement daily.  His weight has been stable.  He denies any rectal bleeding, nausea or vomiting.  Follow-up visit 06/09/2023 Todd Briggs is here for  follow-up of his Crohn's disease.  He underwent evaluation to assess the severity of underlying Crohn's disease because of symptoms of right lower quadrant discomfort along with abdominal bloating.  He initially underwent CT enterography in 2/25 which was unremarkable.   His fecal calprotectin levels were elevated at 187.  Subsequently underwent a colonoscopy on 05/05/2023 which revealed moderate chronic active enteritis.  Colon biopsies were unremarkable.  He continues to take budesonide 6 to 9 mg daily along with colestipol.  He denies any concerns today.  NSAIDs: None  Antiplts/Anticoagulants/Anti thrombotics: None  GI Procedures:   Colonoscopy 05/05/2023 - Preparation of the colon was fair. - Patent end- to- side ileo- colonic anastomosis, characterized by inflammation. - Crohn' s disease with ileitis. Inflammation was found. This was graded as Rutgeerts Score i2 ( more than five aphthous lesions with normal intervening mucosa or skip areas of larger lesions or lesions confined to the ileocolonic anastomosis) . Biopsied. - Two 4 to 5 mm polyps in the sigmoid colon and in the descending colon, removed with a cold snare. Resected and retrieved. - Normal mucosa in the left colon and in the right colon. Biopsied. - The distal rectum and anal verge are normal on retroflexion view.  FINAL DIAGNOSIS       1. Terminal ileum, biopsy, Neo :      - MODERATE CHRONIC ACTIVE ENTERITIS INVOLVING A SUBSET OF THE BIOPSY FRAGMENTS.      - SMALL INTESTINAL MUCOSA WITH NON-SPECIFIC INFLAMMATION AND REACTIVE CHANGES.      - NEGATIVE FOR GRANULOMAS, DYSPLASIA, AND MALIGNANCY.       2. Colon, biopsy, Right :      - UNREMARKABLE COLONIC MUCOSA.      - NEGATIVE FOR GRANULOMAS, DYSPLASIA, AND MALIGNANCY.       3. Colon, biopsy, Left :      - UNREMARKABLE COLONIC MUCOSA.      - NEGATIVE FOR GRANULOMAS, DYSPLASIA, AND MALIGNANCY.       4. Sigmoid  Colon Polyp,  :      - HYPERPLASTIC POLYP.      - NEGATIVE FOR DYSPLASIA AND MALIGNANCY.       5. Descending Colon Polyp,  :      - HYPERPLASTIC POLYP.      - NEGATIVE FOR DYSPLASIA AND MALIGNANCY.    Colonoscopy 03/20/2020 - Patent end-to-side ileo-colonic anastomosis, characterized by healthy appearing mucosa and an intact  staple line.  Could not traverse the neoterminal ileum due to postsurgical anatomy despite several attempts - The entire examined colon is normal. - The distal rectum and anal verge are normal on retroflexion view.  Colonoscopy 03/2019 Terminal ileitis, normal colon  DIAGNOSIS:  A. TERMINAL ILEUM; BIOPSY:  - PATCHY ACTIVE ILEITIS.  - SEE COMMENT.   Comment:  The findings are nonspecific.  Diagnostic considerations would include  self-limited / infectious enteritis / colitis, medication effects,  inflammatory bowel disease, etc.  Clinical correlation is recommended.  There is no evidence of dysplasia or malignancy.   B. COLON, RANDOM; BIOPSY:  - COLONIC MUCOSA WITH NO SIGNIFICANT PATHOLOGIC ALTERATION.  - NEGATIVE FOR MICROSCOPIC COLITIS, DYSPLASIA, AND MALIGNANCY.   Laparoscopic ileocecectomy 09/13/2019 Final Diagnosis    A: Terminal ileum and cecum, resection -Small intestine with chronic active ileitis, stricture, ulcerations, and diverticula (see comment) -Severe acute serositis with fibrinopurulent exudate -No granulomas identified -Appendix with fibrous obliteration  -Four lymph nodes, negative for malignancy (0/4) -Proximal margin appears histologically viable and is involved by mucosal ulceration -Distal margin  appears histologically viable -No dysplasia or malignancy   The purpose of this addendum is to report the results of additional special stains.  An EBV ISH is negative.  A GMS stain is negative. A CMV stain was performed and is negative. There are no changes to the original diagnosis.  Past Medical History:  Diagnosis Date   Abdominal pain 02/15/2019   Allergy    Seasonal   Anemia    Arthritis    Basal cell carcinoma    Removed 1980's, renal   Cataracts, both eyes    Chronic kidney disease    Crohn's colitis, with abscess (HCC) 08/2019   Deaf, left    Diarrhea 02/21/2019   Diverticulitis    GERD (gastroesophageal reflux disease)    Hypokalemia  02/21/2019   IBS (irritable bowel syndrome)    Peptic ulcer 78 years old   Scarlet fever 79 years old   Shortness of breath dyspnea 1992   with exertion due to chemical at work   Wears dentures    full upper   Wears hearing aid in right ear     Past Surgical History:  Procedure Laterality Date   CATARACT EXTRACTION W/ INTRAOCULAR LENS  IMPLANT, BILATERAL     CHOLECYSTECTOMY N/A 03/30/2015   Procedure: LAPAROSCOPIC CHOLECYSTECTOMY;  Surgeon: Gladis Riffle, MD;  Location: [facility] ORS;  Service: General;  Laterality: N/A;   COLON SURGERY  09/13/2019   .ileostomy. Pt reports complications   COLONOSCOPY WITH PROPOFOL N/A 04/15/2019   Procedure: COLONOSCOPY WITH BIOPSY AND DILATION;  Surgeon: Midge Minium, MD;  Location: [facility];  Service: Endoscopy;  Laterality: N/A;  Priority 3   COLONOSCOPY WITH PROPOFOL N/A 03/20/2020   Procedure: COLONOSCOPY WITH PROPOFOL;  Surgeon: Toney Reil, MD;  Location: [facility] END[facility]COPY;  Service: Gastroenterology;  Laterality: N/A;   COLONOSCOPY WITH PROPOFOL N/A 05/05/2023   Procedure: COLONOSCOPY WITH PROPOFOL WITH  BIOPSIES;  Surgeon: Toney Reil, MD;  Location: [facility];  Service: Endoscopy;  Laterality: N/A;   EYE SURGERY     HERNIA REPAIR Bilateral 78 years old   Inguinal Hernia   IR RADIOLOGIST EVAL & MGMT  04/24/2020   IR RADIOLOGIST EVAL & MGMT  07/03/2020   IR RADIOLOGIST EVAL & MGMT  10/03/2020   IR RADIOLOGIST EVAL & MGMT  04/16/2021   IR RADIOLOGIST EVAL & MGMT  06/04/2022   IR RADIOLOGIST EVAL & MGMT  10/13/2022   lasik     POLYPECTOMY  05/05/2023   Procedure: POLYPECTOMY;  Surgeon: Toney Reil, MD;  Location: [facility];  Service: Endoscopy;;   RADIOLOGY WITH ANESTHESIA Right 06/06/2020   Procedure: RADIOLOGY WITH ANESTHESIA  CRYOABLATION OF RIGHT RENAL MASS;  Surgeon: Oley Balm, MD;  Location: [facility] ORS;  Service: Radiology;  Laterality: Right;   reanastomosis of ileostomy  2021    Current  Outpatient Medications:    acetaminophen (TYLENOL) 500 MG tablet, Take 500 mg by mouth every 8 (eight) hours as needed for moderate pain., Disp: , Rfl:    budesonide (ENTOCORT EC) 3 MG 24 hr capsule, TAKE 3 CAPSULES (9 MG TOTAL) BY MOUTH DAILY., Disp: 90 capsule, Rfl: 0   cetirizine (ZYRTEC) 10 MG tablet, Take 10 mg by mouth daily., Disp: , Rfl:    colestipol (COLESTID) 1 g tablet, TAKE 2 TABLETS BY MOUTH TWICE A DAY, Disp: 360 tablet, Rfl: 0   Family History  Problem Relation Age of Onset   Hypertension Mother    Diabetes Mother  Pancreatitis Mother    Heart disease Mother    Heart attack Father    COPD Father    Heart disease Father      Social History   Tobacco Use   Smoking status: Former    Current packs/day: 0.00    Types: Cigarettes    Quit date: 02/28/1985    Years since quitting: 38.3    Passive exposure: Past   Smokeless tobacco: Never  Vaping Use   Vaping status: Never Used  Substance Use Topics   Alcohol use: Yes    Comment: rare   Drug use: No    Allergies as of 06/09/2023 - Review Complete 06/09/2023  Allergen Reaction Noted   Penicillins Rash 12/05/2013    Review of Systems:    All systems reviewed and negative except where noted in HPI.   Physical Exam:  BP (!) 190/95 (BP Location: Left Arm, Patient Position: Sitting, Cuff Size: Normal)   Pulse 73   Temp (!) 97.5 F (36.4 C) (Oral)   Ht 5\' 10"  (1.778 m)   Wt 162 lb 6 oz (73.7 kg)   BMI 23.30 kg/m  No LMP for male patient.  General:   Alert,  Well-developed, well-nourished, pleasant and cooperative in NAD Head:  Normocephalic and atraumatic. Eyes:  Sclera clear, no icterus.   Conjunctiva pink. Ears:  Normal auditory acuity. Nose:  No deformity, discharge, or lesions. Mouth:  No deformity or lesions,oropharynx pink & moist. Neck:  Supple; no masses or thyromegaly. Lungs:  Respirations even and unlabored.  Clear throughout to auscultation.   No wheezes, crackles, or rhonchi. No acute  distress. Heart:  Regular rate and rhythm; no murmurs, clicks, rubs, or gallops. Abdomen:  Normal bowel sounds. Soft, non-tender and non-distended without masses, hepatosplenomegaly or hernias noted.  No guarding or rebound tenderness.  Well-healed scar from recent surgery Rectal: Not performed Msk:  Symmetrical without gross deformities. Good, equal movement & strength bilaterally. Pulses:  Normal pulses noted. Extremities:  No clubbing or edema.  No cyanosis. Neurologic:  Alert and oriented x3;  grossly normal neurologically. Skin:  Intact without significant lesions or rashes. No jaundice. Psych:  Alert and cooperative. Normal mood and affect.  Imaging Studies: Reviewed  Assessment and Plan:   Todd Briggs is a 78 y.o. male with history of acute inflammatory ileitis, ileal diverticulosis, probable diverticulitis with perforation, abscess s/p drainage, colonoscopy revealed active terminal ileitis with no evidence of chronicity on histology.   MR enterography revealed pseudo sacculation, mural stratification involving the distal ileum as well as associated stricture without significant upstream bowel obstruction or current evidence of penetrating disease. Patient developed recurrence of terminal ileitis in 7/21, underwent laparoscopic ileocecectomy on 09/13/2019, 35 cm of terminal ileum was removed with 5 cm of cecum, s/p temporary ileostomy followed by closure in 11/2019.  Pathology confirmed Crohn's disease.  Patient has been experiencing symptoms of right lower quadrant pain associated with abdominal bloating since 02/2023.  Workup revealed chronic moderately active enteritis based on the colonoscopy in 04/2023 with elevated fecal calprotectin levels  Crohn's disease of the terminal ileum, stricturing and penetrating phenotype s/p ileocecectomy and ileostomy on 09/13/2019, s/p takedown in 11/2019 No obvious evidence of postop recurrence of Crohn's disease based on colonoscopy in 02/2020.    Repeat colonoscopy in 04/2023 revealed active ileitis and pathology confirmed moderate chronic active enteritis consistent with active Crohn's disease, elevated fecal calprotectin levels Patient is currently on colestipol and budesonide 3 mg 2-3 pills daily Patient could not afford  high co-pay for Humira as well as vedolizumab in the past Will apply to his insurance again for vedolizumab, he is willing to try as long as he could afford it Patient is immune to hepatitis A, hepatitis B negative, HCV negative, Quantiferon gold negative  Iron deficiency anemia: Resolved   Follow up in 3 to 4 months   Karma Oz, MD

## 2023-06-10 ENCOUNTER — Telehealth: Payer: Self-pay

## 2023-06-10 NOTE — Telephone Encounter (Signed)
 Filled out new start paper work for Entyvio  infusion induction and Entyvio injection for maintence dose. Sent new start paper work to [employer] attached labs, Colonoscopy report, path, demographics, medication list, and last office visit note. Sent Terri Fester a email to let her know it was coming her way. Filled out also the Entyvio connect form and faxed to to them.

## 2023-06-17 NOTE — Telephone Encounter (Signed)
 Sent Terri Fester a email to get a update on the medication

## 2023-06-17 NOTE — Telephone Encounter (Signed)
 Per Todd Briggs spoke with entyvio and they had him with a pretty big copay but I don't believe it can be as high with the new part D max copays- I asked them to retry and we are running on our side this am and I'll update later this am

## 2023-06-18 ENCOUNTER — Telehealth: Payer: Self-pay

## 2023-06-18 NOTE — Telephone Encounter (Signed)
 Emailed Todd Briggs to inform her that the Entyvio has been approved by patient insurance company

## 2023-06-18 NOTE — Telephone Encounter (Signed)
 Approved today by [facility] NCPDP [id number] Your request has been approved Effective Date: 02/25/2023 Authorization Expiration Date: 02/24/2024

## 2023-06-18 NOTE — Telephone Encounter (Signed)
 Submitted PA through Cover my meds for Entyvio. Waiting on response from insurance company

## 2023-06-20 ENCOUNTER — Other Ambulatory Visit: Payer: Self-pay | Admitting: Gastroenterology

## 2023-06-22 NOTE — Telephone Encounter (Signed)
 Per Terri Fester Its better than it was.. He has $1300 left to hit his oop but he can spread out over next 6 months and pay like $200 a month--but also means all other part D charges will be covered for all meds for rest of the year.  We are waiting for him to call us  today if you hear from him

## 2023-06-25 NOTE — Telephone Encounter (Signed)
 Per Terri Fester it would be covered because he would meet the $2000 out of pocket from what I see but let me confirm.   Called patient to inform patient that we can try to get him approve for patient assistance but patient wife states he is asleep and she will have him call us  back when he wakes up

## 2023-06-25 NOTE — Telephone Encounter (Signed)
 Patient states if they do house hold income he would not get approved for patient assistance because his wife makes to much money. He states but his money is his and her money is hers. Informed him that with the new medicare part D plans that they can set up payment plans till they meet the 2000 dollars and then the medications would be free after the 2000 dollars. He states he can pay the 2000 dollars if it is not going to cost anything else the rest of the year. He states he will think about it and will give us  a call back when he decides

## 2023-06-25 NOTE — Telephone Encounter (Signed)
 Per Terri Fester We did and the wife said it was to much. would you mind calling her though and see what she says  - because he's going to end up paying that $2000 out of pocket this year no matter what since it's perscription drug part d and she can pay in payments though end of the year.  Patient states 200 dollars for each infusion is still to much money and he can not afford this . He asked how much would the injections be. Informed him I did not know but I would have to send a email to Terri Fester to find out this information. Sent a email and will call patient back when I find out that information. He also asked if there was any patient assistance for the medications. Called Entyvio connect because he states he lives on a social security  check. They do have patient assistance will fill out form for patient

## 2023-06-29 NOTE — Telephone Encounter (Signed)
 Patient left a message on my voicemail and states that he talk to the insurance company and that is right and he only has to pay 2000 dollars for all his medications and he is okay with that. He states that he is okay with starting the medication. Todd Briggs with [employer] and she states she will call the patient and get him set up for this then. Called patient and patient wife states he is under the house and she will have him call me back

## 2023-06-30 NOTE — Telephone Encounter (Signed)
 Patient left a voicemail on my main voicemail when I was in [place] yesterday. I called patient back and he states he is okay with starting the medication. He states he will set it up when they call.

## 2023-07-02 NOTE — Telephone Encounter (Signed)
 Asked Terri Fester if they have schedule patient for the infusion and Per Terri Fester No ?? we left 3 messages for wife But hasn't called back. Called patient and [place] but his wife is supposed to be calling patient back today to schedule the infusion and pay today

## 2023-08-04 ENCOUNTER — Telehealth: Payer: Self-pay | Admitting: Urology

## 2023-08-04 NOTE — Telephone Encounter (Signed)
 PT had MRI 09/2022 ordered by Dr. Pierre Briar, he is a pt of Dr. Estanislao Heimlich as well.  He is currently having back pain at the spot that showed up on MRI. and he says Dr. Pierre Briar usually looks at MRI and calls him.  Wife thinks Dr Estanislao Heimlich sees these as well.  Dr Mir did an ablation on 06/06/20 in [place].  Pt had kidney cancer in the past.    This was from last procedure note:  He prefers to follow-up with urology as needed Continue follow-up with IR regarding surveillance imaging with history of cryoablation for renal mass  Should they call Dr. Pierre Briar to request MRI, or schedule follow up with Dr. Estanislao Heimlich.  They have not been able to find or contact Dr. Pierre Briar.

## 2023-08-04 NOTE — Telephone Encounter (Signed)
 The patient will need to contact the provider who ordered the scan to discuss pain as they are the ones who have been treating his renal mass, they may need new imaging and I'm sure they will need to see the patient.

## 2023-08-06 NOTE — Telephone Encounter (Signed)
 LMOM for pt to let her know and gave phone numbers for Dr. Pierre Briar.

## 2023-08-12 ENCOUNTER — Other Ambulatory Visit: Payer: Self-pay | Admitting: Gastroenterology

## 2023-08-12 DIAGNOSIS — D509 Iron deficiency anemia, unspecified: Secondary | ICD-10-CM

## 2023-08-12 DIAGNOSIS — K50012 Crohn's disease of small intestine with intestinal obstruction: Secondary | ICD-10-CM

## 2023-08-26 ENCOUNTER — Telehealth: Payer: Self-pay

## 2023-08-26 NOTE — Telephone Encounter (Signed)
 Pt's spouse called to look into when pt should be getting the Entyvio pens as he was initiated with infusions first on 07/29/2023 and was supposed to transition to pens. I do not see any Rx for pens in patient's chart. I reached to [employer] to investigated and I received a response, shown below.  Walterine Hollering!    You may escribe or fax a Rx to [facility] and we will get him on the happy path for his Entyvio injection.  Below is a link to our enrollment form in case you prefer to fax in the Rx.  You may escribe to:   [facility]  [address] [place] [address]   [email or web address]

## 2023-08-27 NOTE — Telephone Encounter (Signed)
 Pt's spouse called to check on status... I let her know Dr Unk stated that she will take care of the Rx for the pens and she will reach out to [facility] going forward to follow up

## 2023-12-01 ENCOUNTER — Other Ambulatory Visit: Payer: Self-pay | Admitting: Gastroenterology

## 2023-12-03 LAB — CALPROTECTIN, FECAL: Calprotectin, Fecal: 129 ug/g — ABNORMAL HIGH (ref 0–120)

## 2023-12-09 ENCOUNTER — Ambulatory Visit: Payer: Self-pay | Admitting: Gastroenterology

## 2023-12-09 NOTE — Progress Notes (Signed)
 Reviewed, improving fecal calprotectin levels.  No change in current treatment.  Patient notified  RV
